# Patient Record
Sex: Female | Born: 1990 | Race: Black or African American | Hispanic: No | Marital: Single | Smoking: Never smoker
Health system: Southern US, Community
[De-identification: ages and names within clinical notes are randomized; demographics above are authoritative.]

## PROBLEM LIST (undated history)

## (undated) ENCOUNTER — Inpatient Hospital Stay (HOSPITAL_COMMUNITY): Payer: Self-pay

## (undated) ENCOUNTER — Ambulatory Visit: Admission: EM | Payer: Medicaid Other

## (undated) DIAGNOSIS — I10 Essential (primary) hypertension: Secondary | ICD-10-CM

## (undated) DIAGNOSIS — M419 Scoliosis, unspecified: Secondary | ICD-10-CM

## (undated) DIAGNOSIS — F419 Anxiety disorder, unspecified: Secondary | ICD-10-CM

## (undated) DIAGNOSIS — J45909 Unspecified asthma, uncomplicated: Secondary | ICD-10-CM

## (undated) HISTORY — DX: Unspecified asthma, uncomplicated: J45.909

## (undated) HISTORY — DX: Essential (primary) hypertension: I10

## (undated) HISTORY — PX: NO PAST SURGERIES: SHX2092

## (undated) HISTORY — DX: Scoliosis, unspecified: M41.9

## (undated) HISTORY — DX: Anxiety disorder, unspecified: F41.9

---

## 2014-10-14 ENCOUNTER — Ambulatory Visit (INDEPENDENT_AMBULATORY_CARE_PROVIDER_SITE_OTHER): Payer: Self-pay | Admitting: Physician Assistant

## 2014-10-14 VITALS — BP 124/70 | HR 90 | Temp 98.7°F | Resp 18 | Ht 67.5 in | Wt 183.0 lb

## 2014-10-14 DIAGNOSIS — Z111 Encounter for screening for respiratory tuberculosis: Secondary | ICD-10-CM

## 2014-10-14 NOTE — Progress Notes (Signed)
Pt presents to clinic for a TB skin test.  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  She has had PPDs in the past but never had a positive test.  She has no symptoms of TB.

## 2014-10-14 NOTE — Progress Notes (Signed)

## 2014-10-16 ENCOUNTER — Ambulatory Visit (INDEPENDENT_AMBULATORY_CARE_PROVIDER_SITE_OTHER): Payer: Self-pay | Admitting: *Deleted

## 2014-10-16 DIAGNOSIS — Z111 Encounter for screening for respiratory tuberculosis: Secondary | ICD-10-CM

## 2014-10-16 LAB — TB SKIN TEST: TB SKIN TEST: NEGATIVE

## 2014-11-27 ENCOUNTER — Inpatient Hospital Stay (HOSPITAL_COMMUNITY)
Admission: AD | Admit: 2014-11-27 | Discharge: 2014-11-27 | Disposition: A | Discharge status: Home / Self Care | Payer: Self-pay | Source: Ambulatory Visit | Attending: Obstetrics and Gynecology | Admitting: Obstetrics and Gynecology

## 2014-11-27 DIAGNOSIS — Z3202 Encounter for pregnancy test, result negative: Secondary | ICD-10-CM | POA: Insufficient documentation

## 2014-11-27 LAB — URINALYSIS, ROUTINE W REFLEX MICROSCOPIC
BILIRUBIN URINE: NEGATIVE
Glucose, UA: NEGATIVE mg/dL
HGB URINE DIPSTICK: NEGATIVE
KETONES UR: NEGATIVE mg/dL
Leukocytes, UA: NEGATIVE
Nitrite: NEGATIVE
Protein, ur: NEGATIVE mg/dL
UROBILINOGEN UA: 0.2 mg/dL (ref 0.0–1.0)
pH: 5.5 (ref 5.0–8.0)

## 2014-11-27 LAB — HCG, SERUM, QUALITATIVE: Preg, Serum: NEGATIVE

## 2014-11-27 LAB — POCT PREGNANCY, URINE: PREG TEST UR: NEGATIVE

## 2014-11-27 NOTE — Discharge Instructions (Signed)
Contraception Choices Contraception (birth control) is the use of any methods or devices to prevent pregnancy. Below are some methods to help avoid pregnancy. HORMONAL METHODS   Contraceptive implant. This is a thin, plastic tube containing progesterone hormone. It does not contain estrogen hormone. Your health care provider inserts the tube in the inner part of the upper arm. The tube can remain in place for up to 3 years. After 3 years, the implant must be removed. The implant prevents the ovaries from releasing an egg (ovulation), thickens the cervical mucus to prevent sperm from entering the uterus, and thins the lining of the inside of the uterus.  Progesterone-only injections. These injections are given every 3 months by your health care provider to prevent pregnancy. This synthetic progesterone hormone stops the ovaries from releasing eggs. It also thickens cervical mucus and changes the uterine lining. This makes it harder for sperm to survive in the uterus.  Birth control pills. These pills contain estrogen and progesterone hormone. They work by preventing the ovaries from releasing eggs (ovulation). They also cause the cervical mucus to thicken, preventing the sperm from entering the uterus. Birth control pills are prescribed by a health care provider.Birth control pills can also be used to treat heavy periods.  Minipill. This type of birth control pill contains only the progesterone hormone. They are taken every day of each month and must be prescribed by your health care provider.  Birth control patch. The patch contains hormones similar to those in birth control pills. It must be changed once a week and is prescribed by a health care provider.  Vaginal ring. The ring contains hormones similar to those in birth control pills. It is left in the vagina for 3 weeks, removed for 1 week, and then a new one is put back in place. The patient must be comfortable inserting and removing the ring  from the vagina.A health care provider's prescription is necessary.  Emergency contraception. Emergency contraceptives prevent pregnancy after unprotected sexual intercourse. This pill can be taken right after sex or up to 5 days after unprotected sex. It is most effective the sooner you take the pills after having sexual intercourse. Most emergency contraceptive pills are available without a prescription. Check with your pharmacist. Do not use emergency contraception as your only form of birth control. BARRIER METHODS   Female condom. This is a thin sheath (latex or rubber) that is worn over the penis during sexual intercourse. It can be used with spermicide to increase effectiveness.  Female condom. This is a soft, loose-fitting sheath that is put into the vagina before sexual intercourse.  Diaphragm. This is a soft, latex, dome-shaped barrier that must be fitted by a health care provider. It is inserted into the vagina, along with a spermicidal jelly. It is inserted before intercourse. The diaphragm should be left in the vagina for 6 to 8 hours after intercourse.  Cervical cap. This is a round, soft, latex or plastic cup that fits over the cervix and must be fitted by a health care provider. The cap can be left in place for up to 48 hours after intercourse.  Sponge. This is a soft, circular piece of polyurethane foam. The sponge has spermicide in it. It is inserted into the vagina after wetting it and before sexual intercourse.  Spermicides. These are chemicals that kill or block sperm from entering the cervix and uterus. They come in the form of creams, jellies, suppositories, foam, or tablets. They do not require a   prescription. They are inserted into the vagina with an applicator before having sexual intercourse. The process must be repeated every time you have sexual intercourse. INTRAUTERINE CONTRACEPTION  Intrauterine device (IUD). This is a T-shaped device that is put in a woman's uterus  during a menstrual period to prevent pregnancy. There are 2 types:  Copper IUD. This type of IUD is wrapped in copper wire and is placed inside the uterus. Copper makes the uterus and fallopian tubes produce a fluid that kills sperm. It can stay in place for 10 years.  Hormone IUD. This type of IUD contains the hormone progestin (synthetic progesterone). The hormone thickens the cervical mucus and prevents sperm from entering the uterus, and it also thins the uterine lining to prevent implantation of a fertilized egg. The hormone can weaken or kill the sperm that get into the uterus. It can stay in place for 3-5 years, depending on which type of IUD is used. PERMANENT METHODS OF CONTRACEPTION  Female tubal ligation. This is when the woman's fallopian tubes are surgically sealed, tied, or blocked to prevent the egg from traveling to the uterus.  Hysteroscopic sterilization. This involves placing a small coil or insert into each fallopian tube. Your doctor uses a technique called hysteroscopy to do the procedure. The device causes scar tissue to form. This results in permanent blockage of the fallopian tubes, so the sperm cannot fertilize the egg. It takes about 3 months after the procedure for the tubes to become blocked. You must use another form of birth control for these 3 months.  Female sterilization. This is when the female has the tubes that carry sperm tied off (vasectomy).This blocks sperm from entering the vagina during sexual intercourse. After the procedure, the man can still ejaculate fluid (semen). NATURAL PLANNING METHODS  Natural family planning. This is not having sexual intercourse or using a barrier method (condom, diaphragm, cervical cap) on days the woman could become pregnant.  Calendar method. This is keeping track of the length of each menstrual cycle and identifying when you are fertile.  Ovulation method. This is avoiding sexual intercourse during ovulation.  Symptothermal  method. This is avoiding sexual intercourse during ovulation, using a thermometer and ovulation symptoms.  Post-ovulation method. This is timing sexual intercourse after you have ovulated. Regardless of which type or method of contraception you choose, it is important that you use condoms to protect against the transmission of sexually transmitted infections (STIs). Talk with your health care provider about which form of contraception is most appropriate for you.   This information is not intended to replace advice given to you by your health care provider. Make sure you discuss any questions you have with your health care provider.   Document Released: 01/31/2005 Document Revised: 02/05/2013 Document Reviewed: 07/26/2012 Elsevier Interactive Patient Education 2016 Elsevier Inc.  

## 2014-11-27 NOTE — MAU Provider Note (Signed)
History     CSN: [id number]  Arrival date and time: 11/27/14 0011   None     Chief Complaint  Patient presents with  . Possible Pregnancy   HPI Comments: Megan Schneider is a 24 y.o. Who presents today for a pregnancy test. She states that she has had multiple negative pregnancy tests at home, and one positive.   Possible Pregnancy This is a new problem. The current episode started in the past 7 days. The problem has been unchanged. Pertinent negatives include no abdominal pain, fever, nausea or vomiting. Associated symptoms comments: Several negative HPT and one positive HPT. Marland Kitchen Nothing aggravates the symptoms. She has tried nothing for the symptoms.    Past Medical History  Diagnosis Date  . Asthma     No past surgical history on file.  Family History  Problem Relation Age of Onset  . Diabetes Father     Social History  Substance Use Topics  . Smoking status: Never Smoker   . Smokeless tobacco: Not on file  . Alcohol Use: No    Allergies: No Known Allergies  Prescriptions prior to admission  Medication Sig Dispense Refill Last Dose  . medroxyPROGESTERone (DEPO-PROVERA) 150 MG/ML injection Inject 150 mg into the muscle every 3 (three) months.   Taking    Review of Systems  Constitutional: Negative for fever.  Gastrointestinal: Negative for nausea, vomiting, abdominal pain, diarrhea and constipation.  Genitourinary: Negative for dysuria, urgency and frequency.   Physical Exam   Blood pressure 146/81, pulse 65, temperature 98.5 F (36.9 C), temperature source Oral, resp. rate 18, SpO2 99 %.  Physical Exam  Nursing note and vitals reviewed. Constitutional: She is oriented to person, place, and time. She appears well-developed and well-nourished. No distress.  HENT:  Head: Normocephalic.  Cardiovascular: Normal rate.   Respiratory: Effort normal.  Neurological: She is alert and oriented to person, place, and time.  Skin: Skin is warm and dry.   Psychiatric: She has a normal mood and affect.     Results for orders placed or performed during the hospital encounter of 11/27/14 (from the past 24 hour(s))  Urinalysis, Routine w reflex microscopic (not at [facility])     Status: Abnormal   Collection Time: 11/27/14 12:17 AM  Result Value Ref Range   Color, Urine YELLOW YELLOW   APPearance CLEAR CLEAR   Specific Gravity, Urine <1.005 (L) 1.005 - 1.030   pH 5.5 5.0 - 8.0   Glucose, UA NEGATIVE NEGATIVE mg/dL   Hgb urine dipstick NEGATIVE NEGATIVE   Bilirubin Urine NEGATIVE NEGATIVE   Ketones, ur NEGATIVE NEGATIVE mg/dL   Protein, ur NEGATIVE NEGATIVE mg/dL   Urobilinogen, UA 0.2 0.0 - 1.0 mg/dL   Nitrite NEGATIVE NEGATIVE   Leukocytes, UA NEGATIVE NEGATIVE  Pregnancy, urine POC     Status: None   Collection Time: 11/27/14 12:42 AM  Result Value Ref Range   Preg Test, Ur NEGATIVE NEGATIVE  hCG, serum, qualitative     Status: None   Collection Time: 11/27/14 12:54 AM  Result Value Ref Range   Preg, Serum NEGATIVE NEGATIVE    MAU Course  Procedures  MDM   Assessment and Plan   1. Encounter for pregnancy test with result negative    DC home Contraception choices reviewed  RX: none    Follow-up Information    Follow up with [facility] [place].   Why:  As needed   Contact information:   [place]  [phone]        Mathis Bud 11/27/2014, 1:34 AM

## 2014-11-27 NOTE — MAU Note (Signed)
Pt presents with request for preg test. Has had multiple negative home preg test but today one was positive.

## 2015-05-13 ENCOUNTER — Encounter (HOSPITAL_COMMUNITY): Payer: Self-pay

## 2015-05-13 ENCOUNTER — Emergency Department (HOSPITAL_COMMUNITY)
Admission: EM | Admit: 2015-05-13 | Discharge: 2015-05-13 | Disposition: A | Discharge status: Home / Self Care | Payer: Self-pay | Attending: Emergency Medicine | Admitting: Emergency Medicine

## 2015-05-13 DIAGNOSIS — Z3202 Encounter for pregnancy test, result negative: Secondary | ICD-10-CM | POA: Insufficient documentation

## 2015-05-13 DIAGNOSIS — R112 Nausea with vomiting, unspecified: Secondary | ICD-10-CM | POA: Insufficient documentation

## 2015-05-13 DIAGNOSIS — R6883 Chills (without fever): Secondary | ICD-10-CM | POA: Insufficient documentation

## 2015-05-13 DIAGNOSIS — M791 Myalgia: Secondary | ICD-10-CM | POA: Insufficient documentation

## 2015-05-13 DIAGNOSIS — R6889 Other general symptoms and signs: Secondary | ICD-10-CM

## 2015-05-13 DIAGNOSIS — J45909 Unspecified asthma, uncomplicated: Secondary | ICD-10-CM | POA: Insufficient documentation

## 2015-05-13 DIAGNOSIS — M549 Dorsalgia, unspecified: Secondary | ICD-10-CM | POA: Insufficient documentation

## 2015-05-13 DIAGNOSIS — R51 Headache: Secondary | ICD-10-CM | POA: Insufficient documentation

## 2015-05-13 LAB — COMPREHENSIVE METABOLIC PANEL
ALBUMIN: 4.1 g/dL (ref 3.5–5.0)
ALK PHOS: 99 U/L (ref 38–126)
ALT: 11 U/L — AB (ref 14–54)
ANION GAP: 5 (ref 5–15)
AST: 17 U/L (ref 15–41)
BILIRUBIN TOTAL: 0.7 mg/dL (ref 0.3–1.2)
BUN: 9 mg/dL (ref 6–20)
CALCIUM: 9.3 mg/dL (ref 8.9–10.3)
CO2: 26 mmol/L (ref 22–32)
CREATININE: 0.96 mg/dL (ref 0.44–1.00)
Chloride: 111 mmol/L (ref 101–111)
GFR calc non Af Amer: >60 mL/min (ref >60)
GLUCOSE: 90 mg/dL (ref 65–99)
Potassium: 4 mmol/L (ref 3.5–5.1)
Sodium: 142 mmol/L (ref 135–145)
TOTAL PROTEIN: 7 g/dL (ref 6.5–8.1)

## 2015-05-13 LAB — CBC WITH DIFFERENTIAL/PLATELET
BASOS PCT: 1 %
Basophils Absolute: 0 10*3/uL (ref 0.0–0.1)
EOS ABS: 0.1 10*3/uL (ref 0.0–0.7)
EOS PCT: 2 %
HCT: 38.6 % (ref 36.0–46.0)
Hemoglobin: 13.5 g/dL (ref 12.0–15.0)
Lymphocytes Relative: 43 %
Lymphs Abs: 1.8 10*3/uL (ref 0.7–4.0)
MCH: 30.3 pg (ref 26.0–34.0)
MCHC: 35 g/dL (ref 30.0–36.0)
MCV: 86.7 fL (ref 78.0–100.0)
MONO ABS: 0.5 10*3/uL (ref 0.1–1.0)
MONOS PCT: 11 %
Neutro Abs: 1.9 10*3/uL (ref 1.7–7.7)
Neutrophils Relative %: 43 %
Platelets: 260 10*3/uL (ref 150–400)
RBC: 4.45 MIL/uL (ref 3.87–5.11)
RDW: 13.2 % (ref 11.5–15.5)
WBC: 4.3 10*3/uL (ref 4.0–10.5)

## 2015-05-13 LAB — URINALYSIS, ROUTINE W REFLEX MICROSCOPIC
Bilirubin Urine: NEGATIVE
GLUCOSE, UA: NEGATIVE mg/dL
HGB URINE DIPSTICK: NEGATIVE
KETONES UR: NEGATIVE mg/dL
LEUKOCYTES UA: NEGATIVE
Nitrite: NEGATIVE
PROTEIN: NEGATIVE mg/dL
Specific Gravity, Urine: 1.016 (ref 1.005–1.030)
pH: 7 (ref 5.0–8.0)

## 2015-05-13 LAB — LIPASE, BLOOD: Lipase: 23 U/L (ref 11–51)

## 2015-05-13 LAB — POC URINE PREG, ED: Preg Test, Ur: NEGATIVE

## 2015-05-13 MED ORDER — ONDANSETRON 8 MG PO TBDP: 8.0000 mg | ORAL_TABLET | Freq: Three times a day (TID) | ORAL | Status: DC | PRN

## 2015-05-13 MED ORDER — ONDANSETRON 8 MG PO TBDP
8.0000 mg | ORAL_TABLET | Freq: Once | ORAL | Status: AC
  Administered 2015-05-13: 8 mg via ORAL
  Filled 2015-05-13: qty 1

## 2015-05-13 NOTE — Discharge Instructions (Signed)
Take ibuprofen or Tylenol for headache and bodyaches. Take Zofran as prescribed as needed for nausea. Rest and drink plenty of fluids. Follow-up as needed.  Nausea and Vomiting Nausea is a sick feeling that often comes before throwing up (vomiting). Vomiting is a reflex where stomach contents come out of your mouth. Vomiting can cause severe loss of body fluids (dehydration). Children and elderly adults can become dehydrated quickly, especially if they also have diarrhea. Nausea and vomiting are symptoms of a condition or disease. It is important to find the cause of your symptoms. CAUSES   Direct irritation of the stomach lining. This irritation can result from increased acid production (gastroesophageal reflux disease), infection, food poisoning, taking certain medicines (such as nonsteroidal anti-inflammatory drugs), alcohol use, or tobacco use.  Signals from the brain.These signals could be caused by a headache, heat exposure, an inner ear disturbance, increased pressure in the brain from injury, infection, a tumor, or a concussion, pain, emotional stimulus, or metabolic problems.  An obstruction in the gastrointestinal tract (bowel obstruction).  Illnesses such as diabetes, hepatitis, gallbladder problems, appendicitis, kidney problems, cancer, sepsis, atypical symptoms of a heart attack, or eating disorders.  Medical treatments such as chemotherapy and radiation.  Receiving medicine that makes you sleep (general anesthetic) during surgery. DIAGNOSIS Your caregiver may ask for tests to be done if the problems do not improve after a few days. Tests may also be done if symptoms are severe or if the reason for the nausea and vomiting is not clear. Tests may include:  Urine tests.  Blood tests.  Stool tests.  Cultures (to look for evidence of infection).  X-rays or other imaging studies. Test results can help your caregiver make decisions about treatment or the need for additional  tests. TREATMENT You need to stay well hydrated. Drink frequently but in small amounts.You may wish to drink water, sports drinks, clear broth, or eat frozen ice pops or gelatin dessert to help stay hydrated.When you eat, eating slowly may help prevent nausea.There are also some antinausea medicines that may help prevent nausea. HOME CARE INSTRUCTIONS   Take all medicine as directed by your caregiver.  If you do not have an appetite, do not force yourself to eat. However, you must continue to drink fluids.  If you have an appetite, eat a normal diet unless your caregiver tells you differently.  Eat a variety of complex carbohydrates (rice, wheat, potatoes, bread), lean meats, yogurt, fruits, and vegetables.  Avoid high-fat foods because they are more difficult to digest.  Drink enough water and fluids to keep your urine clear or pale yellow.  If you are dehydrated, ask your caregiver for specific rehydration instructions. Signs of dehydration may include:  Severe thirst.  Dry lips and mouth.  Dizziness.  Dark urine.  Decreasing urine frequency and amount.  Confusion.  Rapid breathing or pulse. SEEK IMMEDIATE MEDICAL CARE IF:   You have blood or brown flecks (like coffee grounds) in your vomit.  You have black or bloody stools.  You have a severe headache or stiff neck.  You are confused.  You have severe abdominal pain.  You have chest pain or trouble breathing.  You do not urinate at least once every 8 hours.  You develop cold or clammy skin.  You continue to vomit for longer than 24 to 48 hours.  You have a fever. MAKE SURE YOU:   Understand these instructions.  Will watch your condition.  Will get help right away if you are  not doing well or get worse.   This information is not intended to replace advice given to you by your health care provider. Make sure you discuss any questions you have with your health care provider.   Document Released:  01/31/2005 Document Revised: 04/25/2011 Document Reviewed: 06/30/2010 Elsevier Interactive Patient Education [employer].

## 2015-05-13 NOTE — ED Provider Notes (Signed)
CSN: [id number]     Arrival date & time 05/13/15  1032 History   First MD Initiated Contact with Patient 05/13/15 1213     Chief Complaint  Patient presents with  . Headache  . Generalized Body Aches     (Consider location/radiation/quality/duration/timing/severity/associated sxs/prior Treatment) HPI Megan Schneider is a 25 y.o. female history of asthma, presents to emergency department complaining of headache, body aches, back pain, vomiting for 2 days. She states she hasn't been able to eat since yesterday's lunch. She states she has thrown up everything she tried yesterday. She has not tried to eat or drink anything today. She denies any fever or chills recently. No nasal congestion or sore throat. She denies any cough. She denies any abdominal pain or diarrhea. She has not tried any medications to help her with her symptoms. 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 Denies pregnancy  Past Medical History  Diagnosis Date  . Asthma    History reviewed. No pertinent past surgical history. Family History  Problem Relation Age of Onset  . Diabetes Father    Social History  Substance Use Topics  . Smoking status: Never Smoker   . Smokeless tobacco: None  . Alcohol Use: No   OB History    No data available     Review of Systems  Constitutional: Positive for chills. Negative for fever.  Respiratory: Negative for cough, chest tightness and shortness of breath.   Cardiovascular: Negative for chest pain, palpitations and leg swelling.  Gastrointestinal: Positive for nausea and vomiting. Negative for abdominal pain and diarrhea.  Genitourinary: Negative for dysuria, flank pain and pelvic pain.  Musculoskeletal: Positive for myalgias, back pain and arthralgias. Negative for neck pain and neck stiffness.  Skin: Negative for rash.  Neurological: Negative for dizziness, weakness and headaches.  All other systems reviewed and are  negative.     Allergies  Review of patient's allergies indicates no known allergies.  Home Medications   Prior to Admission medications   Medication Sig Start Date End Date Taking? Authorizing Provider  Pseudoeph-Doxylamine-DM-APAP (NYQUIL PO) Take 2 capsules by mouth at bedtime as needed (cold symptoms).   Yes Historical Provider, MD  Pseudoephedrine-APAP-DM (DAYQUIL PO) Take 2 capsules by mouth daily as needed (cold symptoms).   Yes Historical Provider, MD   BP 134/79 mmHg  Pulse 70  Temp(Src) 98.1 F (36.7 C) (Oral)  Resp 18  SpO2 100%  LMP 05/05/2015 Physical Exam  Constitutional: She appears well-developed and well-nourished. No distress.  HENT:  Head: Normocephalic and atraumatic.  Right Ear: External ear normal.  Left Ear: External ear normal.  Nose: Nose normal.  Mouth/Throat: Oropharynx is clear and moist.  Ear canals and TMs are normal bilat  Eyes: Conjunctivae are normal.  Neck: Neck supple.  Cardiovascular: Normal rate, regular rhythm and normal heart sounds.   Pulmonary/Chest: Effort normal and breath sounds normal. No respiratory distress. She has no wheezes. She has no rales.  Abdominal: Soft. Bowel sounds are normal. She exhibits no distension. There is no tenderness. There is no rebound.  Musculoskeletal: She exhibits no edema.  Neurological: She is alert.  Skin: Skin is warm and dry.  Psychiatric: She has a normal mood and affect. Her behavior is normal.  Nursing note and vitals reviewed.   ED Course  Procedures (including critical care time) Labs Review Labs Reviewed  COMPREHENSIVE METABOLIC PANEL - Abnormal; Notable for the following:    ALT 11 (*)  All other components within normal limits  URINALYSIS, ROUTINE W REFLEX MICROSCOPIC (NOT AT [facility])  CBC WITH DIFFERENTIAL/PLATELET  LIPASE, BLOOD  POC URINE PREG, ED    Imaging Review No results found. I have personally reviewed and evaluated these images and lab results as part of my medical  decision-making.   EKG Interpretation None      MDM   Final diagnoses:  Flu-like symptoms   Patient with headache, body aches for 2 days, vomiting yesterday. States she hasn't needed anything since yesterday lunch. Exam unremarkable, abdomen is nontender. Patient is not actively vomiting. Her vital signs are normal. Offered patient IV with some fluids and medications, she refused. She asked for oral medications. Ordered Zofran 8 mg ODT. Will check labs, urinalysis, urine pregnancy.  All labs and urinalysis completely normal. No signs of dehydration or electrolyte abnormalities. Normal white blood cell count. Normal LFTs and lipase. Not pregnant. Most likely viral illness. Home with Zofran, follow up as needed.  Jeannett Senior, PA-C 05/13/15 [place], MD 05/13/15 [phone]

## 2015-05-13 NOTE — ED Notes (Signed)
Pt states body aches, headache and emesis x 2 days.  Unable to eat.  Hasn't eaten since yesterday.  No fever.

## 2016-01-15 ENCOUNTER — Encounter (HOSPITAL_COMMUNITY): Payer: Self-pay

## 2016-01-15 ENCOUNTER — Inpatient Hospital Stay (HOSPITAL_COMMUNITY)
Admission: AD | Admit: 2016-01-15 | Discharge: 2016-01-15 | Disposition: A | Discharge status: Home / Self Care | Payer: Medicaid Other | Source: Ambulatory Visit | Attending: Obstetrics & Gynecology | Admitting: Obstetrics & Gynecology

## 2016-01-15 ENCOUNTER — Encounter: Payer: Self-pay | Admitting: *Deleted

## 2016-01-15 ENCOUNTER — Ambulatory Visit (INDEPENDENT_AMBULATORY_CARE_PROVIDER_SITE_OTHER): Payer: Medicaid Other | Admitting: *Deleted

## 2016-01-15 DIAGNOSIS — N926 Irregular menstruation, unspecified: Secondary | ICD-10-CM

## 2016-01-15 DIAGNOSIS — Z3201 Encounter for pregnancy test, result positive: Secondary | ICD-10-CM | POA: Diagnosis present

## 2016-01-15 DIAGNOSIS — Z3200 Encounter for pregnancy test, result unknown: Secondary | ICD-10-CM

## 2016-01-15 DIAGNOSIS — N912 Amenorrhea, unspecified: Secondary | ICD-10-CM | POA: Insufficient documentation

## 2016-01-15 LAB — POCT PREGNANCY, URINE: Preg Test, Ur: POSITIVE — AB

## 2016-01-15 NOTE — MAU Provider Note (Signed)
  S:   25 y.o. G1P0 @Unknown  by LMP presents to MAU for pregnancy confirmation.  She denies abdominal pain or vaginal bleeding today. Reports 3 + HPTs this week.  O: BP 140/79 (BP Location: Right Arm)   Pulse 84   Temp 98 F (36.7 C) (Oral)   Resp 16   LMP 11/11/2015  Physical Examination: General appearance - alert, well appearing, and in no distress, oriented to person, place, and time and acyanotic, in no respiratory distress  No results found for this or any previous visit (from the past 48 hour(s)).  A: Missed menses  P: D/C home Informed pt we do not do pregnancy verifications in MAU Referred to [facility] for pregnancy test & verification Return to MAU as needed for pregnancy related emergencies  Jorje Guild, NP 10:20 AM

## 2016-01-15 NOTE — MAU Note (Signed)
Took 3 pregnancy tests were positive just wants to be sure.  Denies any problems today, no pain, no vagina bleeding.  LMP 9/27

## 2016-01-15 NOTE — Progress Notes (Signed)
Is here for pregnancy test today which was positive. LMP 12/14/15 which makes her 4wk4d today with EDD 09/19/16. Is sure of LMP.  Not sure where she would like to get care. Letter of proof of pregnancy given.

## 2016-02-15 NOTE — L&D Delivery Note (Signed)
Delivery Note At 10:47 PM a viable and healthy female was delivered via Vaginal, Spontaneous Delivery (Presentation:OA ;ROA  ).  APGAR: , ; weight pending  .   Placenta status:intact , .  Cord: 3 vessel with the following complications: none  Anesthesia:  epidural Episiotomy: None Lacerations: 2nd degree;Perineal Suture Repair: 3.0 vicryl Est. Blood Loss (mL):  250  Mom to postpartum.  Baby to Couplet care / Skin to Skin.  Sela Hua 09/20/2016, 11:21 PM  OB FELLOW DELIVERY ATTESTATION  I was gloved and present for the delivery in its entirety, and I agree with the above resident's note.    Gailen Shelter, MD OB Fellow 12:10 AM

## 2016-03-07 ENCOUNTER — Encounter (HOSPITAL_COMMUNITY): Payer: Self-pay | Admitting: Emergency Medicine

## 2016-03-07 ENCOUNTER — Emergency Department (HOSPITAL_COMMUNITY)
Admission: EM | Admit: 2016-03-07 | Discharge: 2016-03-07 | Disposition: A | Discharge status: Home / Self Care | Payer: Medicaid Other | Attending: Emergency Medicine | Admitting: Emergency Medicine

## 2016-03-07 DIAGNOSIS — M545 Low back pain, unspecified: Secondary | ICD-10-CM

## 2016-03-07 DIAGNOSIS — J45909 Unspecified asthma, uncomplicated: Secondary | ICD-10-CM | POA: Insufficient documentation

## 2016-03-07 DIAGNOSIS — Z79899 Other long term (current) drug therapy: Secondary | ICD-10-CM | POA: Diagnosis not present

## 2016-03-07 DIAGNOSIS — Z3A12 12 weeks gestation of pregnancy: Secondary | ICD-10-CM | POA: Insufficient documentation

## 2016-03-07 DIAGNOSIS — Y929 Unspecified place or not applicable: Secondary | ICD-10-CM | POA: Insufficient documentation

## 2016-03-07 DIAGNOSIS — Y93F2 Activity, caregiving, lifting: Secondary | ICD-10-CM | POA: Insufficient documentation

## 2016-03-07 DIAGNOSIS — O9989 Other specified diseases and conditions complicating pregnancy, childbirth and the puerperium: Secondary | ICD-10-CM | POA: Insufficient documentation

## 2016-03-07 DIAGNOSIS — X500XXA Overexertion from strenuous movement or load, initial encounter: Secondary | ICD-10-CM | POA: Insufficient documentation

## 2016-03-07 DIAGNOSIS — Y999 Unspecified external cause status: Secondary | ICD-10-CM | POA: Diagnosis not present

## 2016-03-07 LAB — URINALYSIS, ROUTINE W REFLEX MICROSCOPIC
Bilirubin Urine: NEGATIVE
Glucose, UA: NEGATIVE mg/dL
HGB URINE DIPSTICK: NEGATIVE
Ketones, ur: NEGATIVE mg/dL
Nitrite: NEGATIVE
PROTEIN: NEGATIVE mg/dL
Specific Gravity, Urine: 1.01 (ref 1.005–1.030)
pH: 8 (ref 5.0–8.0)

## 2016-03-07 NOTE — Discharge Instructions (Signed)
This is likely a muscle strain. He may take Tylenol for pain. Use a heating pad and soak in a warm bath. Avoid NSAIDs. Your urine does show signs of yeast. Given acute have any symptoms please follow-up with your OB/GYN on Wednesday. Your urine were cultured and will be informed if needed for antibiotics. Please follow-up at your OB/GYN appointment on Wednesday. Return to the ED to develop any urinary symptoms, fevers, worsening abdominal pain, vaginal discharge, vaginal bleeding.

## 2016-03-07 NOTE — ED Provider Notes (Signed)
[place] DEPT Provider Note   CSN: [id number] Arrival date & time: 03/07/16  1024     History   Chief Complaint Chief Complaint  Patient presents with  . Back Pain    HPI Megan Schneider is a 26 y.o. female.  26 yo female with no sig pmh presents to the ED with complaints of left side low back pain. 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 Pt state movement makes the pain worse. She has tried motrin, tylenol, hot and cold compresses at home with little relief. Pt states the pain has progressively worsened. Pt is approx [redacted] weeks pregnant. She is G1. Pt denies any contractions, leaking of fluid, vaginal bleeding, or vaginal discharge. Pt denies any urinary retention, loss of bowel or bladder, saddle paresthesias, numbness/tingling in lower extremities, lower extremities edema. Pt denies any fever, chills, ha, vision changes, headache, vision changes, lightheadedness, dizziness, cp, sob, abd pain n/v/d, urinary symptoms, vaginal bleeding or discharge, change in bowel habits, parathesias. She has her first appointment with her OB/GYN at [facility] on Wednesday.       Past Medical History:  Diagnosis Date  . Asthma     There are no active problems to display for this patient.   History reviewed. No pertinent surgical history.  OB History    Gravida Para Term Preterm AB Living   1             SAB TAB Ectopic Multiple Live Births                   Home Medications    Prior to Admission medications   Not on File    Family History Family History  Problem Relation Age of Onset  . Diabetes Father     Social History Social History  Substance Use Topics  . Smoking status: Never Smoker  . Smokeless tobacco: Not on file  . Alcohol use No     Allergies   Patient has no known allergies.   Review of Systems Review of Systems  Constitutional: Negative for chills and fever.  HENT:  Negative for congestion.   Eyes: Negative for visual disturbance.  Respiratory: Negative for cough and shortness of breath.   Cardiovascular: Negative for chest pain and leg swelling.  Gastrointestinal: Negative for abdominal pain, diarrhea, nausea and vomiting.  Genitourinary: Negative for dysuria, frequency, hematuria, urgency, vaginal bleeding and vaginal discharge.  Musculoskeletal: Positive for back pain (left lower back). Negative for neck pain and neck stiffness.  Skin: Negative for color change and wound.  Neurological: Negative for dizziness, syncope, weakness, light-headedness, numbness and headaches.  All other systems reviewed and are negative.    Physical Exam Updated Vital Signs BP 131/70 (BP Location: Left Arm)   Pulse 66   Temp 99 F (37.2 C) (Oral)   Resp 18   Ht 5\' 7"  (1.702 m)   Wt 83.9 kg   LMP 11/11/2015   SpO2 100%   BMI 28.98 kg/m   Physical Exam  Constitutional: She appears well-developed and well-nourished. No distress.  HENT:  Head: Normocephalic and atraumatic.  Mouth/Throat: Oropharynx is clear and moist.  Eyes: Conjunctivae and EOM are normal. Pupils are equal, round, and reactive to light. Right eye exhibits no discharge. Left eye exhibits no discharge. No scleral icterus.  Neck: Normal range of motion. Neck supple. No thyromegaly present.  No midline c spine tenderness. No  paraspinal tenderness. No deformity or step off noted. Full ROM.  Cardiovascular: Normal rate, regular rhythm, normal heart sounds and intact distal pulses.   DP pulses are 2+ bilaterally  Pulmonary/Chest: Effort normal and breath sounds normal.  Abdominal: Soft. Bowel sounds are normal. She exhibits no distension. There is no tenderness. There is no rebound and no guarding.  No CVA tenderness.  Musculoskeletal: Normal range of motion.  No midline L-spine or T-spine tenderness. No deformities or step-offs noted. Patient with left-sided lumbar paraspinal tenderness that  radiates to left buttocks. Tense musculature noted. Pain does not radiate. Negative straight leg raise test the left and right. Strength 5 out of 5 in lower extremities.  Lymphadenopathy:    She has no cervical adenopathy.  Neurological: She is alert.  Strength 5 out of 5 in lower extremities. Sensation intact. Cap refill normal.  Skin: Skin is warm and dry. Capillary refill takes less than 2 seconds.  Nursing note and vitals reviewed.    ED Treatments / Results  Labs (all labs ordered are listed, but only abnormal results are displayed) Labs Reviewed  URINALYSIS, ROUTINE W REFLEX MICROSCOPIC - Abnormal; Notable for the following:       Result Value   APPearance HAZY (*)    Leukocytes, UA TRACE (*)    Bacteria, UA FEW (*)    Squamous Epithelial / LPF 6-30 (*)    All other components within normal limits  URINE CULTURE    EKG  EKG Interpretation None       Radiology No results found.  Procedures Procedures (including critical care time)  Medications Ordered in ED Medications - No data to display   Initial Impression / Assessment and Plan / ED Course  I have reviewed the triage vital signs and the nursing notes.  Pertinent labs & imaging results that were available during my care of the patient were reviewed by me and considered in my medical decision making (see chart for details).    Patient presents to the ED with complaint of left lower back pain after lifting a patient into the shower 4 days ago. Patient is [redacted] weeks pregnant. Patient denies any red flag symptoms including urinary retention, loss of bowel or bladder, saddle paresthesias. Patient is neurovascularly intact. Patient denies any urinary symptoms. Patient is not tachycardic and afebrile. We'll obtain urine to check for infection. Will assess fetal heart tones. This is likely musculoskeletal injury.Fetal heart tones are 143. Ultrasound was used to determine intrauterine pregnancy. Intrauterine pregnancy was  determined. No signs of ectopic pregnancy on ultrasound. Urine with few bacteria and many squamous epithelials. This is likely asymptomatic bacteriuria. Patient denies urinary symptoms. We'll culture urine not treat at this time. Patient is afebrile and not tachycardic. She does have some yeast in her urine. Patient is not symptomatic. Encouraged to follow-up with her OB/GYN appointment on Wednesday to determine if they don't treatment necessary. Exam is consistent with musculoskeletal pain. Encourage patient to utilize Tylenol. Avoid NSAIDs. Encourage symptomatic treatment. Patient is nontoxic appearing. She is able to a light in the ED. Pt is hemodynamically stable, in NAD, & able to ambulate in the ED. Pain has been managed & has no complaints prior to dc. Pt is comfortable with above plan and is stable for discharge at this time. All questions were answered prior to disposition. Strict return precautions for f/u to the ED were discussed. Pt discussed with Dr. Dayna Barker who is agreeable to the above plan.   Final Clinical Impressions(s) /  ED Diagnoses   Final diagnoses:  Acute left-sided low back pain without sciatica    New Prescriptions New Prescriptions   No medications on file     Doristine Devoid, PA-C 03/07/16 [place], MD 03/08/16 1254

## 2016-03-07 NOTE — ED Triage Notes (Addendum)
Pt sts lower back pain x 4 days; pt sts currently [redacted] weeks pregnant; G1

## 2016-03-07 NOTE — ED Notes (Signed)
FHT 148 at mid suprapubic  region

## 2016-03-08 DIAGNOSIS — Z3490 Encounter for supervision of normal pregnancy, unspecified, unspecified trimester: Secondary | ICD-10-CM | POA: Insufficient documentation

## 2016-03-09 ENCOUNTER — Ambulatory Visit (INDEPENDENT_AMBULATORY_CARE_PROVIDER_SITE_OTHER): Payer: Medicaid Other | Admitting: Obstetrics & Gynecology

## 2016-03-09 ENCOUNTER — Encounter: Payer: Self-pay | Admitting: Obstetrics & Gynecology

## 2016-03-09 ENCOUNTER — Other Ambulatory Visit (HOSPITAL_COMMUNITY)
Admission: RE | Admit: 2016-03-09 | Discharge: 2016-03-09 | Disposition: A | Discharge status: Home / Self Care | Payer: Medicaid Other | Source: Ambulatory Visit | Attending: Obstetrics & Gynecology | Admitting: Obstetrics & Gynecology

## 2016-03-09 DIAGNOSIS — Z113 Encounter for screening for infections with a predominantly sexual mode of transmission: Secondary | ICD-10-CM | POA: Insufficient documentation

## 2016-03-09 DIAGNOSIS — Z3491 Encounter for supervision of normal pregnancy, unspecified, first trimester: Secondary | ICD-10-CM

## 2016-03-09 DIAGNOSIS — Z3490 Encounter for supervision of normal pregnancy, unspecified, unspecified trimester: Secondary | ICD-10-CM

## 2016-03-09 LAB — URINE CULTURE

## 2016-03-09 MED ORDER — PROMETHAZINE HCL 25 MG PO TABS
25.0000 mg | ORAL_TABLET | Freq: Four times a day (QID) | ORAL | 2 refills | Status: DC | PRN
Start: 2016-03-09 — End: 2016-06-29

## 2016-03-09 MED ORDER — PRENATAL VITAMINS 0.8 MG PO TABS: 1.0000 | ORAL_TABLET | Freq: Every day | ORAL | 12 refills | Status: DC

## 2016-03-09 NOTE — Progress Notes (Signed)
Patient is in the office, states that she feels good today.

## 2016-03-09 NOTE — Progress Notes (Signed)
  Subjective:    Megan Schneider is a G1P0 [redacted]w[redacted]d being seen today for her first obstetrical visit.  Her obstetrical history is significant for first pregnancy unsure dates. Patient does intend to breast feed. Pregnancy history fully reviewed.  Patient reports nausea.  Vitals:   03/09/16 1108  BP: 119/76  Pulse: 79  Temp: 98.8 F (37.1 C)  Weight: 186 lb 9.6 oz (84.6 kg)    HISTORY: OB History  Gravida Para Term Preterm AB Living  1            SAB TAB Ectopic Multiple Live Births               # Outcome Date GA Lbr Len/2nd Weight Sex Delivery Anes PTL Lv  1 Current              Past Medical History:  Diagnosis Date  . Asthma   . Scoliosis    History reviewed. No pertinent surgical history. Family History  Problem Relation Age of Onset  . Diabetes Father      Exam    Uterus:  Fundal Height: 12 cm  Pelvic Exam:    Perineum: No Hemorrhoids   Vulva: normal   Vagina:  normal mucosa   pH:     Cervix: no lesions   Adnexa: normal adnexa   Bony Pelvis: average  System: Breast:  normal appearance, no masses or tenderness   Skin: normal coloration and turgor, no rashes    Neurologic: oriented, normal mood   Extremities: normal strength, tone, and muscle mass   HEENT extra ocular movement intact, oropharynx clear, no lesions and neck supple with midline trachea   Mouth/Teeth dental hygiene good   Neck supple   Cardiovascular: regular rate and rhythm   Respiratory:  appears well, vitals normal, no respiratory distress, acyanotic, normal RR, neck free of mass or lymphadenopathy   Abdomen: soft, non-tender; bowel sounds normal; no masses,  no organomegaly   Urinary: urethral meatus normal      Assessment:    Pregnancy: G1P0 Patient Active Problem List   Diagnosis Date Noted  . Supervision of normal pregnancy, antepartum 03/08/2016        Plan:     Initial labs drawn. Prenatal vitamins. Problem list reviewed and updated. Genetic Screening discussed  First Screen: ordered.  Ultrasound discussed; fetal survey: 18+ weeks.  Follow up in 4 weeks. 50% of 30 min visit spent on counseling and coordination of care.     Emeterio Reeve 03/09/2016

## 2016-03-10 LAB — CYTOLOGY - PAP: DIAGNOSIS: NEGATIVE

## 2016-03-10 LAB — GC/CHLAMYDIA PROBE AMP (~~LOC~~) NOT AT ARMC
CHLAMYDIA, DNA PROBE: NEGATIVE
NEISSERIA GONORRHEA: NEGATIVE

## 2016-03-11 ENCOUNTER — Telehealth: Payer: Self-pay | Admitting: *Deleted

## 2016-03-11 LAB — HEMOGLOBINOPATHY EVALUATION
HGB A: 97.6 % (ref 96.4–98.8)
HGB C: 0 %
HGB S: 0 %
Hemoglobin A2 Quantitation: 2.4 % (ref 1.8–3.2)
Hemoglobin F Quantitation: 0 % (ref 0.0–2.0)

## 2016-03-11 LAB — OBSTETRIC PANEL, INCLUDING HIV
Antibody Screen: NEGATIVE
BASOS ABS: 0 10*3/uL (ref 0.0–0.2)
Basos: 0 %
EOS (ABSOLUTE): 0.1 10*3/uL (ref 0.0–0.4)
Eos: 1 %
HEP B S AG: NEGATIVE
HIV Screen 4th Generation wRfx: NONREACTIVE
Hematocrit: 39.1 % (ref 34.0–46.6)
Hemoglobin: 13.2 g/dL (ref 11.1–15.9)
IMMATURE GRANS (ABS): 0.1 10*3/uL (ref 0.0–0.1)
IMMATURE GRANULOCYTES: 1 %
LYMPHS ABS: 1.6 10*3/uL (ref 0.7–3.1)
LYMPHS: 17 %
MCH: 30.4 pg (ref 26.6–33.0)
MCHC: 33.8 g/dL (ref 31.5–35.7)
MCV: 90 fL (ref 79–97)
MONOCYTES: 10 %
Monocytes Absolute: 0.9 10*3/uL (ref 0.1–0.9)
NEUTROS PCT: 71 %
Neutrophils Absolute: 6.5 10*3/uL (ref 1.4–7.0)
PLATELETS: 254 10*3/uL (ref 150–379)
RBC: 4.34 x10E6/uL (ref 3.77–5.28)
RDW: 14.4 % (ref 12.3–15.4)
RPR: NONREACTIVE
RUBELLA: 4.17 {index} (ref >0.99)
Rh Factor: POSITIVE
WBC: 9.2 10*3/uL (ref 3.4–10.8)

## 2016-03-11 LAB — VARICELLA ZOSTER ANTIBODY, IGG: Varicella zoster IgG: 2108 index (ref >165)

## 2016-03-11 NOTE — Telephone Encounter (Signed)
Pt returned call to office.  Pt states that pharmacy states her Rx were not at pharmacy.  Pt made aware PNV and Phenergan were both escribed and received by pharmacy.  Pt made aware of pharmacy location.  Pt advised to call back Monday if she cannot get Rx. Pt states she has yeast infection, what to use.  Pt made aware Diflucan not to be sent due to end of first trimester.  Pt was made aware that she may use Monistat OTC.  Pt advised to call office if symptoms do not resolve after treatment.

## 2016-03-11 NOTE — Telephone Encounter (Signed)
Pt called to office left no message.  Return call to pt. No answer, LM On VM to call back if needed.

## 2016-03-13 LAB — CULTURE, OB URINE

## 2016-03-13 LAB — URINE CULTURE, OB REFLEX

## 2016-03-16 ENCOUNTER — Ambulatory Visit (HOSPITAL_COMMUNITY)
Admission: RE | Admit: 2016-03-16 | Discharge: 2016-03-16 | Disposition: A | Discharge status: Home / Self Care | Payer: Medicaid Other | Source: Ambulatory Visit | Attending: Obstetrics & Gynecology | Admitting: Obstetrics & Gynecology

## 2016-03-16 ENCOUNTER — Encounter (HOSPITAL_COMMUNITY): Payer: Self-pay

## 2016-03-16 DIAGNOSIS — Z3682 Encounter for antenatal screening for nuchal translucency: Secondary | ICD-10-CM | POA: Insufficient documentation

## 2016-03-16 DIAGNOSIS — Z3490 Encounter for supervision of normal pregnancy, unspecified, unspecified trimester: Secondary | ICD-10-CM

## 2016-03-16 DIAGNOSIS — Z3A12 12 weeks gestation of pregnancy: Secondary | ICD-10-CM | POA: Insufficient documentation

## 2016-03-17 LAB — TOXASSURE SELECT 13 (MW), URINE

## 2016-03-21 ENCOUNTER — Other Ambulatory Visit: Payer: Self-pay

## 2016-04-06 ENCOUNTER — Ambulatory Visit (INDEPENDENT_AMBULATORY_CARE_PROVIDER_SITE_OTHER): Payer: Medicaid Other | Admitting: Obstetrics & Gynecology

## 2016-04-06 DIAGNOSIS — Z3490 Encounter for supervision of normal pregnancy, unspecified, unspecified trimester: Secondary | ICD-10-CM

## 2016-04-06 DIAGNOSIS — Z3492 Encounter for supervision of normal pregnancy, unspecified, second trimester: Secondary | ICD-10-CM

## 2016-04-06 NOTE — Progress Notes (Signed)
   PRENATAL VISIT NOTE  Subjective:  Megan Schneider is a 26 y.o. G1P0 at [redacted]w[redacted]d being seen today for ongoing prenatal care.  She is currently monitored for the following issues for this low-risk pregnancy and has Supervision of normal pregnancy, antepartum on her problem list.  Patient reports no complaints.  Contractions: Not present. Vag. Bleeding: None.   . Denies leaking of fluid.   The following portions of the patient's history were reviewed and updated as appropriate: allergies, current medications, past family history, past medical history, past social history, past surgical history and problem list. Problem list updated.  Objective:   Vitals:   04/06/16 0902  BP: 119/72  Pulse: 73  Weight: 190 lb (86.2 kg)    Fetal Status: Fetal Heart Rate (bpm): 139         General:  Alert, oriented and cooperative. Patient is in no acute distress.  Skin: Skin is warm and dry. No rash noted.   Cardiovascular: Normal heart rate noted  Respiratory: Normal respiratory effort, no problems with respiration noted  Abdomen: Soft, gravid, appropriate for gestational age. Pain/Pressure: Absent     Pelvic:  Cervical exam deferred        Extremities: Normal range of motion.  Edema: None  Mental Status: Normal mood and affect. Normal behavior. Normal judgment and thought content.   Assessment and Plan:  Pregnancy: G1P0 at [redacted]w[redacted]d  1. Encounter for supervision of normal pregnancy, antepartum, unspecified gravidity Normal first screen, AFP today  Preterm labor symptoms and general obstetric precautions including but not limited to vaginal bleeding, contractions, leaking of fluid and fetal movement were reviewed in detail with the patient. Please refer to After Visit Summary for other counseling recommendations.  Return in about 4 weeks (around 05/04/2016).   Woodroe Mode, MD

## 2016-04-06 NOTE — Patient Instructions (Signed)
Second Trimester of Pregnancy The second trimester is from week 13 through week 28 (months 4 through 6). The second trimester is often a time when you feel your best. Your body has also adjusted to being pregnant, and you begin to feel better physically. Usually, morning sickness has lessened or quit completely, you may have more energy, and you may have an increase in appetite. The second trimester is also a time when the fetus is growing rapidly. At the end of the sixth month, the fetus is about 9 inches long and weighs about 1 pounds. You will likely begin to feel the baby move (quickening) between 18 and 20 weeks of the pregnancy. Body changes during your second trimester Your body continues to go through many changes during your second trimester. The changes vary from woman to woman.  Your weight will continue to increase. You will notice your lower abdomen bulging out.  You may begin to get stretch marks on your hips, abdomen, and breasts.  You may develop headaches that can be relieved by medicines. The medicines should be approved by your health care provider.  You may urinate more often because the fetus is pressing on your bladder.  You may develop or continue to have heartburn as a result of your pregnancy.  You may develop constipation because certain hormones are causing the muscles that push waste through your intestines to slow down.  You may develop hemorrhoids or swollen, bulging veins (varicose veins).  You may have back pain. This is caused by:  Weight gain.  Pregnancy hormones that are relaxing the joints in your pelvis.  A shift in weight and the muscles that support your balance.  Your breasts will continue to grow and they will continue to become tender.  Your gums may bleed and may be sensitive to brushing and flossing.  Dark spots or blotches (chloasma, mask of pregnancy) may develop on your face. This will likely fade after the baby is born.  A dark line  from your belly button to the pubic area (linea nigra) may appear. This will likely fade after the baby is born.  You may have changes in your hair. These can include thickening of your hair, rapid growth, and changes in texture. Some women also have hair loss during or after pregnancy, or hair that feels dry or thin. Your hair will most likely return to normal after your baby is born. What to expect at prenatal visits During a routine prenatal visit:  You will be weighed to make sure you and the fetus are growing normally.  Your blood pressure will be taken.  Your abdomen will be measured to track your baby's growth.  The fetal heartbeat will be listened to.  Any test results from the previous visit will be discussed. Your health care provider may ask you:  How you are feeling.  If you are feeling the baby move.  If you have had any abnormal symptoms, such as leaking fluid, bleeding, severe headaches, or abdominal cramping.  If you are using any tobacco products, including cigarettes, chewing tobacco, and electronic cigarettes.  If you have any questions. Other tests that may be performed during your second trimester include:  Blood tests that check for:  Low iron levels (anemia).  Gestational diabetes (between 24 and 28 weeks).  Rh antibodies. This is to check for a protein on red blood cells (Rh factor).  Urine tests to check for infections, diabetes, or protein in the urine.  An ultrasound to  confirm the proper growth and development of the baby.  An amniocentesis to check for possible genetic problems.  Fetal screens for spina bifida and Down syndrome.  HIV (human immunodeficiency virus) testing. Routine prenatal testing includes screening for HIV, unless you choose not to have this test. Follow these instructions at home: Eating and drinking  Continue to eat regular, healthy meals.  Avoid raw meat, uncooked cheese, cat litter boxes, and soil used by cats. These  carry germs that can cause birth defects in the baby.  Take your prenatal vitamins.  Take 1500-2000 mg of calcium daily starting at the 20th week of pregnancy until you deliver your baby.  If you develop constipation:  Take over-the-counter or prescription medicines.  Drink enough fluid to keep your urine clear or pale yellow.  Eat foods that are high in fiber, such as fresh fruits and vegetables, whole grains, and beans.  Limit foods that are high in fat and processed sugars, such as fried and sweet foods. Activity  Exercise only as directed by your health care provider. Experiencing uterine cramps is a good sign to stop exercising.  Avoid heavy lifting, wear low heel shoes, and practice good posture.  Wear your seat belt at all times when driving.  Rest with your legs elevated if you have leg cramps or low back pain.  Wear a good support bra for breast tenderness.  Do not use hot tubs, steam rooms, or saunas. Lifestyle  Avoid all smoking, herbs, alcohol, and unprescribed drugs. These chemicals affect the formation and growth of the baby.  Do not use any products that contain nicotine or tobacco, such as cigarettes and e-cigarettes. If you need help quitting, ask your health care provider.  A sexual relationship may be continued unless your health care provider directs you otherwise. General instructions  Follow your health care provider's instructions regarding medicine use. There are medicines that are either safe or unsafe to take during pregnancy.  Take warm sitz baths to soothe any pain or discomfort caused by hemorrhoids. Use hemorrhoid cream if your health care provider approves.  If you develop varicose veins, wear support hose. Elevate your feet for 15 minutes, 3-4 times a day. Limit salt in your diet.  Visit your dentist if you have not gone yet during your pregnancy. Use a soft toothbrush to brush your teeth and be gentle when you floss.  Keep all follow-up  prenatal visits as told by your health care provider. This is important. Contact a health care provider if:  You have dizziness.  You have mild pelvic cramps, pelvic pressure, or nagging pain in the abdominal area.  You have persistent nausea, vomiting, or diarrhea.  You have a bad smelling vaginal discharge.  You have pain with urination. Get help right away if:  You have a fever.  You are leaking fluid from your vagina.  You have spotting or bleeding from your vagina.  You have severe abdominal cramping or pain.  You have rapid weight gain or weight loss.  You have shortness of breath with chest pain.  You notice sudden or extreme swelling of your face, hands, ankles, feet, or legs.  You have not felt your baby move in over an hour.  You have severe headaches that do not go away with medicine.  You have vision changes. Summary  The second trimester is from week 13 through week 28 (months 4 through 6). It is also a time when the fetus is growing rapidly.  Your body goes  through many changes during pregnancy. The changes vary from woman to woman.  Avoid all smoking, herbs, alcohol, and unprescribed drugs. These chemicals affect the formation and growth your baby.  Do not use any tobacco products, such as cigarettes, chewing tobacco, and e-cigarettes. If you need help quitting, ask your health care provider.  Contact your health care provider if you have any questions. Keep all prenatal visits as told by your health care provider. This is important. This information is not intended to replace advice given to you by your health care provider. Make sure you discuss any questions you have with your health care provider. Document Released: 01/25/2001 Document Revised: 07/09/2015 Document Reviewed: 04/03/2012 Elsevier Interactive Patient Education  2017 [employer].

## 2016-04-13 LAB — AFP, SERUM, OPEN SPINA BIFIDA
AFP MOM: 1.03
AFP VALUE AFPOSL: 31.8 ng/mL
Gest. Age on Collection Date: 16 weeks
MATERNAL AGE AT EDD: 24.9 a
OSBR Risk 1 IN: 10000
Test Results:: NEGATIVE
Weight: 190 [lb_av]

## 2016-04-26 ENCOUNTER — Ambulatory Visit (HOSPITAL_COMMUNITY)
Admission: RE | Admit: 2016-04-26 | Discharge: 2016-04-26 | Disposition: A | Discharge status: Home / Self Care | Payer: Medicaid Other | Source: Ambulatory Visit | Attending: Obstetrics & Gynecology | Admitting: Obstetrics & Gynecology

## 2016-04-26 DIAGNOSIS — Z3A19 19 weeks gestation of pregnancy: Secondary | ICD-10-CM | POA: Diagnosis not present

## 2016-04-26 DIAGNOSIS — Z363 Encounter for antenatal screening for malformations: Secondary | ICD-10-CM | POA: Diagnosis not present

## 2016-04-26 DIAGNOSIS — O99512 Diseases of the respiratory system complicating pregnancy, second trimester: Secondary | ICD-10-CM | POA: Diagnosis not present

## 2016-04-26 DIAGNOSIS — J45909 Unspecified asthma, uncomplicated: Secondary | ICD-10-CM | POA: Diagnosis not present

## 2016-04-26 DIAGNOSIS — Z3490 Encounter for supervision of normal pregnancy, unspecified, unspecified trimester: Secondary | ICD-10-CM

## 2016-05-04 ENCOUNTER — Ambulatory Visit (INDEPENDENT_AMBULATORY_CARE_PROVIDER_SITE_OTHER): Payer: Medicaid Other | Admitting: Obstetrics and Gynecology

## 2016-05-04 DIAGNOSIS — Z3490 Encounter for supervision of normal pregnancy, unspecified, unspecified trimester: Secondary | ICD-10-CM

## 2016-05-04 DIAGNOSIS — Z3402 Encounter for supervision of normal first pregnancy, second trimester: Secondary | ICD-10-CM

## 2016-05-04 NOTE — Progress Notes (Signed)
Subjective:  Megan Schneider is a 26 y.o. G1P0 at [redacted]w[redacted]d being seen today for ongoing prenatal care.  She is currently monitored for the following issues for this low-risk pregnancy and has Supervision of normal pregnancy, antepartum on her problem list.  Patient reports some round ligament pain.  Contractions: Irritability. Vag. Bleeding: None.   . Denies leaking of fluid.   The following portions of the patient's history were reviewed and updated as appropriate: allergies, current medications, past family history, past medical history, past social history, past surgical history and problem list. Problem list updated.  Objective:   Vitals:   05/04/16 0902  BP: 124/67  Pulse: 72  Weight: 193 lb (87.5 kg)    Fetal Status: Fetal Heart Rate (bpm): 140         General:  Alert, oriented and cooperative. Patient is in no acute distress.  Skin: Skin is warm and dry. No rash noted.   Cardiovascular: Normal heart rate noted  Respiratory: Normal respiratory effort, no problems with respiration noted  Abdomen: Soft, gravid, appropriate for gestational age. Pain/Pressure: Absent     Pelvic:  Cervical exam deferred        Extremities: Normal range of motion.     Mental Status: Normal mood and affect. Normal behavior. Normal judgment and thought content.   Urinalysis:      Assessment and Plan:  Pregnancy: G1P0 at [redacted]w[redacted]d  1. Encounter for supervision of normal pregnancy, antepartum, unspecified gravidity Stable F/U U/S scheduled Considering water birth, information provided to pt  Preterm labor symptoms and general obstetric precautions including but not limited to vaginal bleeding, contractions, leaking of fluid and fetal movement were reviewed in detail with the patient. Please refer to After Visit Summary for other counseling recommendations.  Return in about 4 weeks (around 06/01/2016) for OB visit.   Chancy Milroy, MD

## 2016-05-04 NOTE — Progress Notes (Signed)
Pt states she has had increase in cramping over the past 2 days, pt denies bleeding.

## 2016-05-04 NOTE — Patient Instructions (Signed)

## 2016-05-09 ENCOUNTER — Encounter (HOSPITAL_COMMUNITY): Payer: Self-pay

## 2016-05-09 ENCOUNTER — Inpatient Hospital Stay (HOSPITAL_COMMUNITY)
Admission: AD | Admit: 2016-05-09 | Discharge: 2016-05-09 | Disposition: A | Discharge status: Home / Self Care | Payer: Medicaid Other | Source: Ambulatory Visit | Attending: Obstetrics and Gynecology | Admitting: Obstetrics and Gynecology

## 2016-05-09 DIAGNOSIS — O2342 Unspecified infection of urinary tract in pregnancy, second trimester: Secondary | ICD-10-CM | POA: Diagnosis not present

## 2016-05-09 DIAGNOSIS — Z3A21 21 weeks gestation of pregnancy: Secondary | ICD-10-CM | POA: Diagnosis not present

## 2016-05-09 DIAGNOSIS — R109 Unspecified abdominal pain: Secondary | ICD-10-CM | POA: Diagnosis present

## 2016-05-09 DIAGNOSIS — O26892 Other specified pregnancy related conditions, second trimester: Secondary | ICD-10-CM | POA: Diagnosis present

## 2016-05-09 DIAGNOSIS — N949 Unspecified condition associated with female genital organs and menstrual cycle: Secondary | ICD-10-CM

## 2016-05-09 LAB — URINALYSIS, ROUTINE W REFLEX MICROSCOPIC
BILIRUBIN URINE: NEGATIVE
Glucose, UA: NEGATIVE mg/dL
Ketones, ur: NEGATIVE mg/dL
Nitrite: NEGATIVE
PROTEIN: NEGATIVE mg/dL
Specific Gravity, Urine: 1.006 (ref 1.005–1.030)
pH: 6 (ref 5.0–8.0)

## 2016-05-09 LAB — CBC
HCT: 33.7 % — ABNORMAL LOW (ref 36.0–46.0)
Hemoglobin: 11.8 g/dL — ABNORMAL LOW (ref 12.0–15.0)
MCH: 31.1 pg (ref 26.0–34.0)
MCHC: 35 g/dL (ref 30.0–36.0)
MCV: 88.9 fL (ref 78.0–100.0)
Platelets: 192 10*3/uL (ref 150–400)
RBC: 3.79 MIL/uL — AB (ref 3.87–5.11)
RDW: 13.6 % (ref 11.5–15.5)
WBC: 13 10*3/uL — ABNORMAL HIGH (ref 4.0–10.5)

## 2016-05-09 LAB — COMPREHENSIVE METABOLIC PANEL
ALT: 10 U/L — ABNORMAL LOW (ref 14–54)
AST: 17 U/L (ref 15–41)
Albumin: 3.6 g/dL (ref 3.5–5.0)
Alkaline Phosphatase: 61 U/L (ref 38–126)
Anion gap: 5 (ref 5–15)
BUN: 10 mg/dL (ref 6–20)
CHLORIDE: 104 mmol/L (ref 101–111)
CO2: 26 mmol/L (ref 22–32)
CREATININE: 0.68 mg/dL (ref 0.44–1.00)
Calcium: 9 mg/dL (ref 8.9–10.3)
GFR calc non Af Amer: >60 mL/min (ref >60)
Glucose, Bld: 91 mg/dL (ref 65–99)
Potassium: 3.5 mmol/L (ref 3.5–5.1)
SODIUM: 135 mmol/L (ref 135–145)
Total Bilirubin: 0.4 mg/dL (ref 0.3–1.2)
Total Protein: 7.1 g/dL (ref 6.5–8.1)

## 2016-05-09 LAB — WET PREP, GENITAL
Clue Cells Wet Prep HPF POC: NONE SEEN
Sperm: NONE SEEN
Trich, Wet Prep: NONE SEEN
Yeast Wet Prep HPF POC: NONE SEEN

## 2016-05-09 MED ORDER — CEPHALEXIN 500 MG PO CAPS: 500.0000 mg | ORAL_CAPSULE | Freq: Four times a day (QID) | ORAL | 0 refills | Status: DC

## 2016-05-09 MED ORDER — ACETAMINOPHEN 500 MG PO TABS
1000.0000 mg | ORAL_TABLET | Freq: Once | ORAL | Status: AC
  Administered 2016-05-09: 1000 mg via ORAL
  Filled 2016-05-09: qty 2

## 2016-05-09 NOTE — MAU Provider Note (Signed)
History     CSN: [id number]  Arrival date and time: 05/09/16 2051   First Provider Initiated Contact with Patient 05/09/16 2115      Chief Complaint  Patient presents with  . Abdominal Pain   Abdominal Pain  This is a new problem. The current episode started today (around 1500 ). The onset quality is sudden. The problem occurs intermittently. The problem has been unchanged. The pain is located in the RLQ. The pain is at a severity of 10/10. The quality of the pain is sharp and aching. The abdominal pain does not radiate. Pertinent negatives include no constipation (last BM earlier today around 1700. She states that the pain became worse after the BM), dysuria, fever, frequency, nausea or vomiting. Nothing aggravates the pain. The pain is relieved by nothing. She has tried nothing for the symptoms.   Past Medical History:  Diagnosis Date  . Asthma   . Scoliosis     Past Surgical History:  Procedure Laterality Date  . NO PAST SURGERIES      Family History  Problem Relation Age of Onset  . Diabetes Father     Social History  Substance Use Topics  . Smoking status: Never Smoker  . Smokeless tobacco: Never Used  . Alcohol use No    Allergies: No Known Allergies  Prescriptions Prior to Admission  Medication Sig Dispense Refill Last Dose  . Prenatal Multivit-Min-Fe-FA (PRENATAL VITAMINS) 0.8 MG tablet Take 1 tablet by mouth daily. 30 tablet 12 05/09/2016 at Unknown time  . promethazine (PHENERGAN) 25 MG tablet Take 1 tablet (25 mg total) by mouth every 6 (six) hours as needed for nausea or vomiting. (Patient not taking: Reported on 05/04/2016) 30 tablet 2 Not Taking    Review of Systems  Constitutional: Negative for chills and fever.  Gastrointestinal: Positive for abdominal pain. Negative for constipation (last BM earlier today around 1700. She states that the pain became worse after the BM), nausea and vomiting.  Genitourinary: Negative for dysuria, frequency, urgency,  vaginal bleeding and vaginal discharge.   Physical Exam   Blood pressure 139/80, pulse 81, resp. rate 20, height 5\' 7"  (1.702 m), weight 192 lb (87.1 kg), last menstrual period 12/14/2015.  Physical Exam  Nursing note and vitals reviewed. Constitutional: She is oriented to person, place, and time. She appears well-developed and well-nourished. No distress.  HENT:  Head: Normocephalic.  Cardiovascular: Normal rate.   Respiratory: Effort normal.  GI: Soft. There is no tenderness. There is no rebound.  Genitourinary:  Genitourinary Comments:  External: no lesion Vagina: small amount of white discharge Cervix: pink, smooth, no CMT, closed/thick  Uterus: AGA, FHT 147 with doppler   Neurological: She is alert and oriented to person, place, and time.  Skin: Skin is warm and dry.  Psychiatric: She has a normal mood and affect.     Results for orders placed or performed during the hospital encounter of 05/09/16 (from the past 24 hour(s))  Urinalysis, Routine w reflex microscopic     Status: Abnormal   Collection Time: 05/09/16  9:00 PM  Result Value Ref Range   Color, Urine STRAW (A) YELLOW   APPearance CLEAR CLEAR   Specific Gravity, Urine 1.006 1.005 - 1.030   pH 6.0 5.0 - 8.0   Glucose, UA NEGATIVE NEGATIVE mg/dL   Hgb urine dipstick MODERATE (A) NEGATIVE   Bilirubin Urine NEGATIVE NEGATIVE   Ketones, ur NEGATIVE NEGATIVE mg/dL   Protein, ur NEGATIVE NEGATIVE mg/dL   Nitrite NEGATIVE NEGATIVE  Leukocytes, UA TRACE (A) NEGATIVE   RBC / HPF 0-5 0 - 5 RBC/hpf   WBC, UA 0-5 0 - 5 WBC/hpf   Bacteria, UA RARE (A) NONE SEEN   Squamous Epithelial / LPF 6-30 (A) NONE SEEN  Wet prep, genital     Status: Abnormal   Collection Time: 05/09/16  9:25 PM  Result Value Ref Range   Yeast Wet Prep HPF POC NONE SEEN NONE SEEN   Trich, Wet Prep NONE SEEN NONE SEEN   Clue Cells Wet Prep HPF POC NONE SEEN NONE SEEN   WBC, Wet Prep HPF POC MANY (A) NONE SEEN   Sperm NONE SEEN   CBC      Status: Abnormal   Collection Time: 05/09/16  9:40 PM  Result Value Ref Range   WBC 13.0 (H) 4.0 - 10.5 K/uL   RBC 3.79 (L) 3.87 - 5.11 MIL/uL   Hemoglobin 11.8 (L) 12.0 - 15.0 g/dL   HCT 33.7 (L) 36.0 - 46.0 %   MCV 88.9 78.0 - 100.0 fL   MCH 31.1 26.0 - 34.0 pg   MCHC 35.0 30.0 - 36.0 g/dL   RDW 13.6 11.5 - 15.5 %   Platelets 192 150 - 400 K/uL  Comprehensive metabolic panel     Status: Abnormal   Collection Time: 05/09/16  9:40 PM  Result Value Ref Range   Sodium 135 135 - 145 mmol/L   Potassium 3.5 3.5 - 5.1 mmol/L   Chloride 104 101 - 111 mmol/L   CO2 26 22 - 32 mmol/L   Glucose, Bld 91 65 - 99 mg/dL   BUN 10 6 - 20 mg/dL   Creatinine, Ser 0.68 0.44 - 1.00 mg/dL   Calcium 9.0 8.9 - 10.3 mg/dL   Total Protein 7.1 6.5 - 8.1 g/dL   Albumin 3.6 3.5 - 5.0 g/dL   AST 17 15 - 41 U/L   ALT 10 (L) 14 - 54 U/L   Alkaline Phosphatase 61 38 - 126 U/L   Total Bilirubin 0.4 0.3 - 1.2 mg/dL   GFR calc non Af Amer >60 >60 mL/min   GFR calc Af Amer >60 >60 mL/min   Anion gap 5 5 - 15    MAU Course  Procedures  MDM Patient has had tylenol, and reports that her pain is better at this time.   Assessment and Plan   1. UTI in pregnancy, antepartum, second trimester   2. Round ligament pain   3. [redacted] weeks gestation of pregnancy    DC home Comfort measures reviewed  2nd/3rd Trimester precautions  PTL precautions  Fetal kick counts RX: kelfex QID #28  Return to MAU as needed FU with OB as planned  [place] for [place] Follow up.   Specialty:  Obstetrics and Gynecology Contact information: [place] [phone]           Mathis Bud 05/09/2016, 9:21 PM

## 2016-05-09 NOTE — MAU Note (Signed)
Pt c/o right sided pain that started at 3pm. States pain is sharp and constant. Rates 10/10. Has not taken anything for pain. Pt denies vaginal bleeding or LOF. Some white discharge, which is normal for her. Denies urinary s/s or n/v/d. LBM: today.

## 2016-05-09 NOTE — Discharge Instructions (Signed)
Round Ligament Pain during Pregnancy Many women will experience a type of pain referred to as round ligament pain during their pregnancy. This is associated with abdominal pain or discomfort. Since any type of abdominal pain during pregnancy can be disconcerting, it is important to talk about round ligament pain to relieve any anxiety or fears you may have regarding the symptoms you are feeling. Round ligament pain is due to normal changes that take place in the body during pregnancy. It is caused by stretching of the round ligaments attached to the uterus. More commonly it occurs on the right side of the pelvis. Round Ligament: An Overview Typically in the non-pregnant state the uterus is about the size of an apple or pear. There are thick ligaments which hold the uterus in place in the abdomen, referred to as round ligaments. During pregnancy, your uterus will expand in size and weight, and the ligaments supporting it will have to stretch, becoming longer and thinner. As these ligaments pull and tug they may irritate nearby nerve fibers, which causes pain. The severity of the pain in some cases can seem extreme. Some common symptoms of round ligament pain include:  Ligament spasms or contractions/cramps that trigger a sharp pain typically on the right side of the abdomen.  Pain upon waking or suddenly rolling over in your sleep.  Pain in the abdomen that is sharp brought on by exercise or other vigorous activity. Similar Problems Round ligament pain is often mistaken for other medical conditions because the symptoms are similar. Acute abdominal pain during pregnancy may also be a sign of other conditions including:  Abdominal cramps - Some abdominal pain is simply caused by change in bowel habits associated with pregnancy. Gas is a common problem that can cause sharp, shooting pain. You should always seek out medical care if your pain is accompanied by fever, chills, pain  upon urination or if you have difficulty walking. Further exams and tests will be conducted to ensure that you do not have a more serious condition. It is not uncommon for women with lower abdominal pain to have a urinary tract infection, thus you may also be asked for a urine sample. Treatment If all other conditions are ruled out you can treat your round ligament pain relatively easily. You may be advised to take some acetaminophen (Tylenol) to reduce the severity of any persistent pain and asked to reduce your activity level. You can apply a heating pad to the area of pain or take a warm bath. Lying on the opposite side of the pain may help as well. Most women will find relief from round ligament pain simply by altering their daily routines slightly. The good news is round ligament pain will disappear completely once you have given birth to your child!  Pregnancy and Urinary Tract Infection What is a urinary tract infection? A urinary tract infection (UTI) is an infection of any part of the urinary tract. This includes the kidneys, the tubes that connect your kidneys to your bladder (ureters), the bladder, and the tube that carries urine out of your body (urethra). These organs make, store, and get rid of urine in the body. A UTI can be a bladder infection (cystitis) or a kidney infection (pyelonephritis). This infection may be caused by fungi, viruses, and bacteria. Bacteria are the most common cause of UTIs. You are more likely to develop a UTI during pregnancy because:  The physical and hormonal changes your body goes through can make it easier for  bacteria to get into your urinary tract.  Your growing baby puts pressure on your uterus and can affect urine flow. Does a UTI place my baby at risk? An untreated UTI during pregnancy could lead to a kidney infection, which can cause health problems that could affect your baby. Possible complications of an untreated UTI include:  Having your  baby before 37 weeks of pregnancy (premature).  Having a baby with a low birth weight.  Developing high blood pressure during pregnancy (preeclampsia). What are the symptoms of a UTI? Symptoms of a UTI include:  Fever.  Frequent urination or passing small amounts of urine frequently.  Needing to urinate urgently.  Pain or a burning sensation with urination.  Urine that smells bad or unusual.  Cloudy urine.  Pain in the lower abdomen or back.  Trouble urinating.  Blood in the urine.  Vomiting or being less hungry than normal.  Diarrhea or abdominal pain.  Vaginal discharge. What are the treatment options for a UTI during pregnancy? Treatment for this condition may include:  Antibiotic medicines that are safe to take during pregnancy.  Other medicines to treat less common causes of UTI. How can I prevent a UTI?   To prevent a UTI:  Go to the bathroom as soon as you feel the need.  Always wipe from front to back.  Wash your genital area with soap and warm water daily.  Empty your bladder before and after sex.  Wear cotton underwear.  Limit your intake of high sugar foods or drinks, such as regular soda, juice, and sweets.  Drink 6-8 glasses of water daily.  Do not wear tight-fitting pants.  Do not douche or use deodorant sprays.  Do not drink alcohol, caffeine, or carbonated drinks. These can irritate the bladder. Contact a health care provider if:  Your symptoms do not improve or get worse.  You have a fever after two days of treatment.  You have a rash.  You have abnormal vaginal discharge.  You have back or side pain.  You have chills.  You have nausea and vomiting. Get help right away if: Seek immediate medical care if you are pregnant and:  You feel contractions in your uterus.  You have lower belly pain.  You have a gush of fluid from your vagina.  You have blood in your urine.  You are vomiting and cannot keep down any  medicines or water. This information is not intended to replace advice given to you by your health care provider. Make sure you discuss any questions you have with your health care provider. Document Released: 05/28/2010 Document Revised: 01/15/2016 Document Reviewed: 12/22/2014 Elsevier Interactive Patient Education  2017 [employer].

## 2016-05-10 LAB — GC/CHLAMYDIA PROBE AMP (~~LOC~~) NOT AT ARMC
Chlamydia: NEGATIVE
NEISSERIA GONORRHEA: NEGATIVE

## 2016-05-11 LAB — CULTURE, OB URINE

## 2016-06-01 ENCOUNTER — Ambulatory Visit (INDEPENDENT_AMBULATORY_CARE_PROVIDER_SITE_OTHER): Payer: Medicaid Other | Admitting: Obstetrics & Gynecology

## 2016-06-01 DIAGNOSIS — Z3402 Encounter for supervision of normal first pregnancy, second trimester: Secondary | ICD-10-CM

## 2016-06-01 DIAGNOSIS — Z3400 Encounter for supervision of normal first pregnancy, unspecified trimester: Secondary | ICD-10-CM

## 2016-06-01 NOTE — Patient Instructions (Signed)
Second Trimester of Pregnancy The second trimester is from week 13 through week 28, month 4 through 6. This is often the time in pregnancy that you feel your best. Often times, morning sickness has lessened or quit. You may have more energy, and you may get hungry more often. Your unborn baby (fetus) is growing rapidly. At the end of the sixth month, he or she is about 9 inches long and weighs about 1 pounds. You will likely feel the baby move (quickening) between 18 and 20 weeks of pregnancy. Follow these instructions at home:  Avoid all smoking, herbs, and alcohol. Avoid drugs not approved by your doctor.  Do not use any tobacco products, including cigarettes, chewing tobacco, and electronic cigarettes. If you need help quitting, ask your doctor. You may get counseling or other support to help you quit.  Only take medicine as told by your doctor. Some medicines are safe and some are not during pregnancy.  Exercise only as told by your doctor. Stop exercising if you start having cramps.  Eat regular, healthy meals.  Wear a good support bra if your breasts are tender.  Do not use hot tubs, steam rooms, or saunas.  Wear your seat belt when driving.  Avoid raw meat, uncooked cheese, and liter boxes and soil used by cats.  Take your prenatal vitamins.  Take 1500-2000 milligrams of calcium daily starting at the 20th week of pregnancy until you deliver your baby.  Try taking medicine that helps you poop (stool softener) as needed, and if your doctor approves. Eat more fiber by eating fresh fruit, vegetables, and whole grains. Drink enough fluids to keep your pee (urine) clear or pale yellow.  Take warm water baths (sitz baths) to soothe pain or discomfort caused by hemorrhoids. Use hemorrhoid cream if your doctor approves.  If you have puffy, bulging veins (varicose veins), wear support hose. Raise (elevate) your feet for 15 minutes, 3-4 times a day. Limit salt in your diet.  Avoid heavy  lifting, wear low heals, and sit up straight.  Rest with your legs raised if you have leg cramps or low back pain.  Visit your dentist if you have not gone during your pregnancy. Use a soft toothbrush to brush your teeth. Be gentle when you floss.  You can have sex (intercourse) unless your doctor tells you not to.  Go to your doctor visits. Get help if:  You feel dizzy.  You have mild cramps or pressure in your lower belly (abdomen).  You have a nagging pain in your belly area.  You continue to feel sick to your stomach (nauseous), throw up (vomit), or have watery poop (diarrhea).  You have bad smelling fluid coming from your vagina.  You have pain with peeing (urination). Get help right away if:  You have a fever.  You are leaking fluid from your vagina.  You have spotting or bleeding from your vagina.  You have severe belly cramping or pain.  You lose or gain weight rapidly.  You have trouble catching your breath and have chest pain.  You notice sudden or extreme puffiness (swelling) of your face, hands, ankles, feet, or legs.  You have not felt the baby move in over an hour.  You have severe headaches that do not go away with medicine.  You have vision changes. This information is not intended to replace advice given to you by your health care provider. Make sure you discuss any questions you have with your health care   provider. Document Released: 04/27/2009 Document Revised: 07/09/2015 Document Reviewed: 04/03/2012 Elsevier Interactive Patient Education  2017 [employer]  

## 2016-06-01 NOTE — Progress Notes (Signed)
Patient reports good fetal movement, denies pain. 

## 2016-06-01 NOTE — Progress Notes (Signed)
   PRENATAL VISIT NOTE  Subjective:  Megan Schneider is a 26 y.o. G1P0 at [redacted]w[redacted]d being seen today for ongoing prenatal care.  She is currently monitored for the following issues for this low-risk pregnancy and has Supervision of normal pregnancy, antepartum on her problem list.  Patient reports no complaints.   .  .   . Denies leaking of fluid.   The following portions of the patient's history were reviewed and updated as appropriate: allergies, current medications, past family history, past medical history, past social history, past surgical history and problem list. Problem list updated.  Objective:   Vitals:   06/01/16 0936  BP: 116/76  Pulse: 88  Weight: 198 lb 5 oz (90 kg)    Fetal Status: Fetal Heart Rate (bpm): 143         General:  Alert, oriented and cooperative. Patient is in no acute distress.  Skin: Skin is warm and dry. No rash noted.   Cardiovascular: Normal heart rate noted  Respiratory: Normal respiratory effort, no problems with respiration noted  Abdomen: Soft, gravid, appropriate for gestational age.       Pelvic:  Cervical exam deferred        Extremities: Normal range of motion.     Mental Status: Normal mood and affect. Normal behavior. Normal judgment and thought content.   Assessment and Plan:  Pregnancy: G1P0 at [redacted]w[redacted]d  1. Supervision of normal first pregnancy, antepartum [place] will have f/u in 2 days  Preterm labor symptoms and general obstetric precautions including but not limited to vaginal bleeding, contractions, leaking of fluid and fetal movement were reviewed in detail with the patient. Please refer to After Visit Summary for other counseling recommendations.  Return in about 4 weeks (around 06/29/2016) for 2 hr gtt.   Woodroe Mode, MD

## 2016-06-03 ENCOUNTER — Ambulatory Visit (HOSPITAL_COMMUNITY)
Admission: RE | Admit: 2016-06-03 | Discharge: 2016-06-03 | Disposition: A | Discharge status: Home / Self Care | Payer: Medicaid Other | Source: Ambulatory Visit | Attending: Obstetrics and Gynecology | Admitting: Obstetrics and Gynecology

## 2016-06-03 DIAGNOSIS — Z3482 Encounter for supervision of other normal pregnancy, second trimester: Secondary | ICD-10-CM | POA: Diagnosis not present

## 2016-06-03 DIAGNOSIS — Z3A24 24 weeks gestation of pregnancy: Secondary | ICD-10-CM | POA: Insufficient documentation

## 2016-06-03 DIAGNOSIS — Z3490 Encounter for supervision of normal pregnancy, unspecified, unspecified trimester: Secondary | ICD-10-CM

## 2016-06-03 DIAGNOSIS — Z3480 Encounter for supervision of other normal pregnancy, unspecified trimester: Secondary | ICD-10-CM | POA: Diagnosis present

## 2016-06-29 ENCOUNTER — Ambulatory Visit (INDEPENDENT_AMBULATORY_CARE_PROVIDER_SITE_OTHER): Payer: Medicaid Other | Admitting: Obstetrics and Gynecology

## 2016-06-29 VITALS — BP 125/76 | HR 101 | Wt 201.2 lb

## 2016-06-29 DIAGNOSIS — L723 Sebaceous cyst: Secondary | ICD-10-CM | POA: Insufficient documentation

## 2016-06-29 DIAGNOSIS — D239 Other benign neoplasm of skin, unspecified: Secondary | ICD-10-CM | POA: Insufficient documentation

## 2016-06-29 DIAGNOSIS — Z3400 Encounter for supervision of normal first pregnancy, unspecified trimester: Secondary | ICD-10-CM

## 2016-06-29 DIAGNOSIS — Z3403 Encounter for supervision of normal first pregnancy, third trimester: Secondary | ICD-10-CM

## 2016-06-29 DIAGNOSIS — Z23 Encounter for immunization: Secondary | ICD-10-CM | POA: Diagnosis not present

## 2016-06-29 MED ORDER — CEPHALEXIN 500 MG PO CAPS: 500.0000 mg | ORAL_CAPSULE | Freq: Three times a day (TID) | ORAL | 0 refills | Status: DC

## 2016-06-29 NOTE — Progress Notes (Signed)
Patient reports she is having boils on her inner thighs.

## 2016-06-29 NOTE — Progress Notes (Signed)
Subjective:  Megan Schneider is a 26 y.o. G1P0 at [redacted]w[redacted]d being seen today for ongoing prenatal care.  She is currently monitored for the following issues for this low-risk pregnancy and has Supervision of normal pregnancy, antepartum and Sebaceous cyst on her problem list.  Patient reports "boils" on her inner thigh x 3 .  Contractions: Not present. Vag. Bleeding: None.  Movement: Present. Denies leaking of fluid.   The following portions of the patient's history were reviewed and updated as appropriate: allergies, current medications, past family history, past medical history, past social history, past surgical history and problem list. Problem list updated.  Objective:   Vitals:   06/29/16 0947  BP: 125/76  Pulse: (!) 101  Weight: 201 lb 3.2 oz (91.3 kg)    Fetal Status: Fetal Heart Rate (bpm): 145   Movement: Present     General:  Alert, oriented and cooperative. Patient is in no acute distress.  Skin: Skin is warm and dry. No rash noted.   Cardiovascular: Normal heart rate noted  Respiratory: Normal respiratory effort, no problems with respiration noted  Abdomen: Soft, gravid, appropriate for gestational age. Pain/Pressure: Absent     Pelvic:  Cervical exam deferred        Extremities: Normal range of motion.  Edema: None  Mental Status: Normal mood and affect. Normal behavior. Normal judgment and thought content.   Urinalysis:      Assessment and Plan:  Pregnancy: G1P0 at 103w2d  1. Supervision of normal first pregnancy, antepartum Stable, 28 wk labs today - Glucose Tolerance, 2 Hours w/1 Hour - CBC - RPR - HIV antibody (with reflex) - Tdap vaccine greater than or equal to 7yo IM  2. Sebaceous cyst General care with warm compress and antibiotic onit reviewed  - cephALEXin (KEFLEX) 500 MG capsule; Take 1 capsule (500 mg total) by mouth 3 (three) times daily.  Dispense: 21 capsule; Refill: 0  Preterm labor symptoms and general obstetric precautions including but not  limited to vaginal bleeding, contractions, leaking of fluid and fetal movement were reviewed in detail with the patient. Please refer to After Visit Summary for other counseling recommendations.  Return in about 2 weeks (around 07/13/2016) for OB visit.   Chancy Milroy, MD

## 2016-06-30 LAB — GLUCOSE TOLERANCE, 2 HOURS W/ 1HR
GLUCOSE, FASTING: 85 mg/dL (ref 65–91)
Glucose, 1 hour: 113 mg/dL (ref 65–179)
Glucose, 2 hour: 117 mg/dL (ref 65–152)

## 2016-06-30 LAB — CBC
HEMATOCRIT: 36.2 % (ref 34.0–46.6)
Hemoglobin: 12.1 g/dL (ref 11.1–15.9)
MCH: 30.8 pg (ref 26.6–33.0)
MCHC: 33.4 g/dL (ref 31.5–35.7)
MCV: 92 fL (ref 79–97)
Platelets: 184 10*3/uL (ref 150–379)
RBC: 3.93 x10E6/uL (ref 3.77–5.28)
RDW: 14 % (ref 12.3–15.4)
WBC: 11.3 10*3/uL — ABNORMAL HIGH (ref 3.4–10.8)

## 2016-06-30 LAB — RPR: RPR Ser Ql: NONREACTIVE

## 2016-06-30 LAB — HIV ANTIBODY (ROUTINE TESTING W REFLEX): HIV Screen 4th Generation wRfx: NONREACTIVE

## 2016-07-13 ENCOUNTER — Ambulatory Visit (INDEPENDENT_AMBULATORY_CARE_PROVIDER_SITE_OTHER): Payer: Medicaid Other | Admitting: Obstetrics & Gynecology

## 2016-07-13 DIAGNOSIS — Z3403 Encounter for supervision of normal first pregnancy, third trimester: Secondary | ICD-10-CM

## 2016-07-13 DIAGNOSIS — Z3400 Encounter for supervision of normal first pregnancy, unspecified trimester: Secondary | ICD-10-CM

## 2016-07-13 NOTE — Patient Instructions (Signed)
Third Trimester of Pregnancy The third trimester is from week 28 through week 40 (months 7 through 9). The third trimester is a time when the unborn baby (fetus) is growing rapidly. At the end of the ninth month, the fetus is about 20 inches in length and weighs 6-10 pounds. Body changes during your third trimester Your body will continue to go through many changes during pregnancy. The changes vary from woman to woman. During the third trimester:  Your weight will continue to increase. You can expect to gain 25-35 pounds (11-16 kg) by the end of the pregnancy.  You may begin to get stretch marks on your hips, abdomen, and breasts.  You may urinate more often because the fetus is moving lower into your pelvis and pressing on your bladder.  You may develop or continue to have heartburn. This is caused by increased hormones that slow down muscles in the digestive tract.  You may develop or continue to have constipation because increased hormones slow digestion and cause the muscles that push waste through your intestines to relax.  You may develop hemorrhoids. These are swollen veins (varicose veins) in the rectum that can itch or be painful.  You may develop swollen, bulging veins (varicose veins) in your legs.  You may have increased body aches in the pelvis, back, or thighs. This is due to weight gain and increased hormones that are relaxing your joints.  You may have changes in your hair. These can include thickening of your hair, rapid growth, and changes in texture. Some women also have hair loss during or after pregnancy, or hair that feels dry or thin. Your hair will most likely return to normal after your baby is born.  Your breasts will continue to grow and they will continue to become tender. A yellow fluid (colostrum) may leak from your breasts. This is the first milk you are producing for your baby.  Your belly button may stick out.  You may notice more swelling in your hands,  face, or ankles.  You may have increased tingling or numbness in your hands, arms, and legs. The skin on your belly may also feel numb.  You may feel short of breath because of your expanding uterus.  You may have more problems sleeping. This can be caused by the size of your belly, increased need to urinate, and an increase in your body's metabolism.  You may notice the fetus "dropping," or moving lower in your abdomen (lightening).  You may have increased vaginal discharge.  You may notice your joints feel loose and you may have pain around your pelvic bone.  What to expect at prenatal visits You will have prenatal exams every 2 weeks until week 36. Then you will have weekly prenatal exams. During a routine prenatal visit:  You will be weighed to make sure you and the baby are growing normally.  Your blood pressure will be taken.  Your abdomen will be measured to track your baby's growth.  The fetal heartbeat will be listened to.  Any test results from the previous visit will be discussed.  You may have a cervical check near your due date to see if your cervix has softened or thinned (effaced).  You will be tested for Group B streptococcus. This happens between 35 and 37 weeks.  Your health care provider may ask you:  What your birth plan is.  How you are feeling.  If you are feeling the baby move.  If you have had   any abnormal symptoms, such as leaking fluid, bleeding, severe headaches, or abdominal cramping.  If you are using any tobacco products, including cigarettes, chewing tobacco, and electronic cigarettes.  If you have any questions.  Other tests or screenings that may be performed during your third trimester include:  Blood tests that check for low iron levels (anemia).  Fetal testing to check the health, activity level, and growth of the fetus. Testing is done if you have certain medical conditions or if there are problems during the  pregnancy.  Nonstress test (NST). This test checks the health of your baby to make sure there are no signs of problems, such as the baby not getting enough oxygen. During this test, a belt is placed around your belly. The baby is made to move, and its heart rate is monitored during movement.  What is false labor? False labor is a condition in which you feel small, irregular tightenings of the muscles in the womb (contractions) that usually go away with rest, changing position, or drinking water. These are called Braxton Hicks contractions. Contractions may last for hours, days, or even weeks before true labor sets in. If contractions come at regular intervals, become more frequent, increase in intensity, or become painful, you should see your health care provider. What are the signs of labor?  Abdominal cramps.  Regular contractions that start at 10 minutes apart and become stronger and more frequent with time.  Contractions that start on the top of the uterus and spread down to the lower abdomen and back.  Increased pelvic pressure and dull back pain.  A watery or bloody mucus discharge that comes from the vagina.  Leaking of amniotic fluid. This is also known as your "water breaking." It could be a slow trickle or a gush. Let your health care provider know if it has a color or strange odor. If you have any of these signs, call your health care provider right away, even if it is before your due date. Follow these instructions at home: Medicines  Follow your health care provider's instructions regarding medicine use. Specific medicines may be either safe or unsafe to take during pregnancy.  Take a prenatal vitamin that contains at least 600 micrograms (mcg) of folic acid.  If you develop constipation, try taking a stool softener if your health care provider approves. Eating and drinking  Eat a balanced diet that includes fresh fruits and vegetables, whole grains, good sources of protein  such as meat, eggs, or tofu, and low-fat dairy. Your health care provider will help you determine the amount of weight gain that is right for you.  Avoid raw meat and uncooked cheese. These carry germs that can cause birth defects in the baby.  If you have low calcium intake from food, talk to your health care provider about whether you should take a daily calcium supplement.  Eat four or five small meals rather than three large meals a day.  Limit foods that are high in fat and processed sugars, such as fried and sweet foods.  To prevent constipation: ? Drink enough fluid to keep your urine clear or pale yellow. ? Eat foods that are high in fiber, such as fresh fruits and vegetables, whole grains, and beans. Activity  Exercise only as directed by your health care provider. Most women can continue their usual exercise routine during pregnancy. Try to exercise for 30 minutes at least 5 days a week. Stop exercising if you experience uterine contractions.  Avoid heavy   lifting.  Do not exercise in extreme heat or humidity, or at high altitudes.  Wear low-heel, comfortable shoes.  Practice good posture.  You may continue to have sex unless your health care provider tells you otherwise. Relieving pain and discomfort  Take frequent breaks and rest with your legs elevated if you have leg cramps or low back pain.  Take warm sitz baths to soothe any pain or discomfort caused by hemorrhoids. Use hemorrhoid cream if your health care provider approves.  Wear a good support bra to prevent discomfort from breast tenderness.  If you develop varicose veins: ? Wear support pantyhose or compression stockings as told by your healthcare provider. ? Elevate your feet for 15 minutes, 3-4 times a day. Prenatal care  Write down your questions. Take them to your prenatal visits.  Keep all your prenatal visits as told by your health care provider. This is important. Safety  Wear your seat belt at  all times when driving.  Make a list of emergency phone numbers, including numbers for family, friends, the hospital, and police and fire departments. General instructions  Avoid cat litter boxes and soil used by cats. These carry germs that can cause birth defects in the baby. If you have a cat, ask someone to clean the litter box for you.  Do not travel far distances unless it is absolutely necessary and only with the approval of your health care provider.  Do not use hot tubs, steam rooms, or saunas.  Do not drink alcohol.  Do not use any products that contain nicotine or tobacco, such as cigarettes and e-cigarettes. If you need help quitting, ask your health care provider.  Do not use any medicinal herbs or unprescribed drugs. These chemicals affect the formation and growth of the baby.  Do not douche or use tampons or scented sanitary pads.  Do not cross your legs for long periods of time.  To prepare for the arrival of your baby: ? Take prenatal classes to understand, practice, and ask questions about labor and delivery. ? Make a trial run to the hospital. ? Visit the hospital and tour the maternity area. ? Arrange for maternity or paternity leave through employers. ? Arrange for family and friends to take care of pets while you are in the hospital. ? Purchase a rear-facing car seat and make sure you know how to install it in your car. ? Pack your hospital bag. ? Prepare the baby's nursery. Make sure to remove all pillows and stuffed animals from the baby's crib to prevent suffocation.  Visit your dentist if you have not gone during your pregnancy. Use a soft toothbrush to brush your teeth and be gentle when you floss. Contact a health care provider if:  You are unsure if you are in labor or if your water has broken.  You become dizzy.  You have mild pelvic cramps, pelvic pressure, or nagging pain in your abdominal area.  You have lower back pain.  You have persistent  nausea, vomiting, or diarrhea.  You have an unusual or bad smelling vaginal discharge.  You have pain when you urinate. Get help right away if:  Your water breaks before 37 weeks.  You have regular contractions less than 5 minutes apart before 37 weeks.  You have a fever.  You are leaking fluid from your vagina.  You have spotting or bleeding from your vagina.  You have severe abdominal pain or cramping.  You have rapid weight loss or weight gain.    You have shortness of breath with chest pain.  You notice sudden or extreme swelling of your face, hands, ankles, feet, or legs.  Your baby makes fewer than 10 movements in 2 hours.  You have severe headaches that do not go away when you take medicine.  You have vision changes. Summary  The third trimester is from week 28 through week 40, months 7 through 9. The third trimester is a time when the unborn baby (fetus) is growing rapidly.  During the third trimester, your discomfort may increase as you and your baby continue to gain weight. You may have abdominal, leg, and back pain, sleeping problems, and an increased need to urinate.  During the third trimester your breasts will keep growing and they will continue to become tender. A yellow fluid (colostrum) may leak from your breasts. This is the first milk you are producing for your baby.  False labor is a condition in which you feel small, irregular tightenings of the muscles in the womb (contractions) that eventually go away. These are called Braxton Hicks contractions. Contractions may last for hours, days, or even weeks before true labor sets in.  Signs of labor can include: abdominal cramps; regular contractions that start at 10 minutes apart and become stronger and more frequent with time; watery or bloody mucus discharge that comes from the vagina; increased pelvic pressure and dull back pain; and leaking of amniotic fluid. This information is not intended to replace advice  given to you by your health care provider. Make sure you discuss any questions you have with your health care provider. Document Released: 01/25/2001 Document Revised: 07/09/2015 Document Reviewed: 04/03/2012 Elsevier Interactive Patient Education  2017 [employer]  

## 2016-07-13 NOTE — Progress Notes (Signed)
   PRENATAL VISIT NOTE  Subjective:  Megan Schneider is a 26 y.o. G1P0 at [redacted]w[redacted]d being seen today for ongoing prenatal care.  She is currently monitored for the following issues for this low-risk pregnancy and has Supervision of normal pregnancy, antepartum and Sebaceous cyst on her problem list.  Patient reports no complaints.  Contractions: Not present. Vag. Bleeding: None.  Movement: Present. Denies leaking of fluid.   The following portions of the patient's history were reviewed and updated as appropriate: allergies, current medications, past family history, past medical history, past social history, past surgical history and problem list. Problem list updated.  Objective:   Vitals:   07/13/16 1419  BP: 127/79  Pulse: (!) 112  Weight: 206 lb (93.4 kg)    Fetal Status: Fetal Heart Rate (bpm): 141  Fundal Height: 31 cm Movement: Present     General:  Alert, oriented and cooperative. Patient is in no acute distress.  Skin: Skin is warm and dry. No rash noted.   Cardiovascular: Normal heart rate noted  Respiratory: Normal respiratory effort, no problems with respiration noted  Abdomen: Soft, gravid, appropriate for gestational age. Pain/Pressure: Absent     Pelvic:  Cervical exam deferred        Extremities: Normal range of motion.  Edema: Trace  Mental Status: Normal mood and affect. Normal behavior. Normal judgment and thought content.   Assessment and Plan:  Pregnancy: G1P0 at [redacted]w[redacted]d  1. Supervision of normal first pregnancy, antepartum Doing well  Preterm labor symptoms and general obstetric precautions including but not limited to vaginal bleeding, contractions, leaking of fluid and fetal movement were reviewed in detail with the patient. Please refer to After Visit Summary for other counseling recommendations.  Return in about 2 weeks (around 07/27/2016).   Emeterio Reeve, MD

## 2016-08-01 ENCOUNTER — Telehealth: Payer: Self-pay | Admitting: *Deleted

## 2016-08-01 NOTE — Telephone Encounter (Signed)
Pt called to office with questions about losing mucous plug.  Call placed to pt. Pt states she thinks she may have lost her mucous plug today but is unsure, wants to know if that's normal. Pt made aware that she may loose her plug at this point. Pt states that she may have increase in thicker mucous discharge, "snot like".  Pt made aware that there may or may not be color to her plug. Pt states she is feeling fine, denies any ctx, bleeding or LOF. Pt advised if she has any of these symptoms she should be seen at [facility] for eval. Pt has appt in office tomorrow, advised to keep appt and can discuss with provider if needing further info.  Pt states understanding.

## 2016-08-02 ENCOUNTER — Ambulatory Visit (INDEPENDENT_AMBULATORY_CARE_PROVIDER_SITE_OTHER): Payer: Medicaid Other | Admitting: Obstetrics & Gynecology

## 2016-08-02 DIAGNOSIS — Z3400 Encounter for supervision of normal first pregnancy, unspecified trimester: Secondary | ICD-10-CM

## 2016-08-02 DIAGNOSIS — Z3403 Encounter for supervision of normal first pregnancy, third trimester: Secondary | ICD-10-CM

## 2016-08-02 NOTE — Patient Instructions (Signed)
Third Trimester of Pregnancy The third trimester is from week 28 through week 40 (months 7 through 9). The third trimester is a time when the unborn baby (fetus) is growing rapidly. At the end of the ninth month, the fetus is about 20 inches in length and weighs 6-10 pounds. Body changes during your third trimester Your body will continue to go through many changes during pregnancy. The changes vary from woman to woman. During the third trimester:  Your weight will continue to increase. You can expect to gain 25-35 pounds (11-16 kg) by the end of the pregnancy.  You may begin to get stretch marks on your hips, abdomen, and breasts.  You may urinate more often because the fetus is moving lower into your pelvis and pressing on your bladder.  You may develop or continue to have heartburn. This is caused by increased hormones that slow down muscles in the digestive tract.  You may develop or continue to have constipation because increased hormones slow digestion and cause the muscles that push waste through your intestines to relax.  You may develop hemorrhoids. These are swollen veins (varicose veins) in the rectum that can itch or be painful.  You may develop swollen, bulging veins (varicose veins) in your legs.  You may have increased body aches in the pelvis, back, or thighs. This is due to weight gain and increased hormones that are relaxing your joints.  You may have changes in your hair. These can include thickening of your hair, rapid growth, and changes in texture. Some women also have hair loss during or after pregnancy, or hair that feels dry or thin. Your hair will most likely return to normal after your baby is born.  Your breasts will continue to grow and they will continue to become tender. A yellow fluid (colostrum) may leak from your breasts. This is the first milk you are producing for your baby.  Your belly button may stick out.  You may notice more swelling in your hands,  face, or ankles.  You may have increased tingling or numbness in your hands, arms, and legs. The skin on your belly may also feel numb.  You may feel short of breath because of your expanding uterus.  You may have more problems sleeping. This can be caused by the size of your belly, increased need to urinate, and an increase in your body's metabolism.  You may notice the fetus "dropping," or moving lower in your abdomen (lightening).  You may have increased vaginal discharge.  You may notice your joints feel loose and you may have pain around your pelvic bone.  What to expect at prenatal visits You will have prenatal exams every 2 weeks until week 36. Then you will have weekly prenatal exams. During a routine prenatal visit:  You will be weighed to make sure you and the baby are growing normally.  Your blood pressure will be taken.  Your abdomen will be measured to track your baby's growth.  The fetal heartbeat will be listened to.  Any test results from the previous visit will be discussed.  You may have a cervical check near your due date to see if your cervix has softened or thinned (effaced).  You will be tested for Group B streptococcus. This happens between 35 and 37 weeks.  Your health care provider may ask you:  What your birth plan is.  How you are feeling.  If you are feeling the baby move.  If you have had   any abnormal symptoms, such as leaking fluid, bleeding, severe headaches, or abdominal cramping.  If you are using any tobacco products, including cigarettes, chewing tobacco, and electronic cigarettes.  If you have any questions.  Other tests or screenings that may be performed during your third trimester include:  Blood tests that check for low iron levels (anemia).  Fetal testing to check the health, activity level, and growth of the fetus. Testing is done if you have certain medical conditions or if there are problems during the  pregnancy.  Nonstress test (NST). This test checks the health of your baby to make sure there are no signs of problems, such as the baby not getting enough oxygen. During this test, a belt is placed around your belly. The baby is made to move, and its heart rate is monitored during movement.  What is false labor? False labor is a condition in which you feel small, irregular tightenings of the muscles in the womb (contractions) that usually go away with rest, changing position, or drinking water. These are called Braxton Hicks contractions. Contractions may last for hours, days, or even weeks before true labor sets in. If contractions come at regular intervals, become more frequent, increase in intensity, or become painful, you should see your health care provider. What are the signs of labor?  Abdominal cramps.  Regular contractions that start at 10 minutes apart and become stronger and more frequent with time.  Contractions that start on the top of the uterus and spread down to the lower abdomen and back.  Increased pelvic pressure and dull back pain.  A watery or bloody mucus discharge that comes from the vagina.  Leaking of amniotic fluid. This is also known as your "water breaking." It could be a slow trickle or a gush. Let your health care provider know if it has a color or strange odor. If you have any of these signs, call your health care provider right away, even if it is before your due date. Follow these instructions at home: Medicines  Follow your health care provider's instructions regarding medicine use. Specific medicines may be either safe or unsafe to take during pregnancy.  Take a prenatal vitamin that contains at least 600 micrograms (mcg) of folic acid.  If you develop constipation, try taking a stool softener if your health care provider approves. Eating and drinking  Eat a balanced diet that includes fresh fruits and vegetables, whole grains, good sources of protein  such as meat, eggs, or tofu, and low-fat dairy. Your health care provider will help you determine the amount of weight gain that is right for you.  Avoid raw meat and uncooked cheese. These carry germs that can cause birth defects in the baby.  If you have low calcium intake from food, talk to your health care provider about whether you should take a daily calcium supplement.  Eat four or five small meals rather than three large meals a day.  Limit foods that are high in fat and processed sugars, such as fried and sweet foods.  To prevent constipation: ? Drink enough fluid to keep your urine clear or pale yellow. ? Eat foods that are high in fiber, such as fresh fruits and vegetables, whole grains, and beans. Activity  Exercise only as directed by your health care provider. Most women can continue their usual exercise routine during pregnancy. Try to exercise for 30 minutes at least 5 days a week. Stop exercising if you experience uterine contractions.  Avoid heavy   lifting.  Do not exercise in extreme heat or humidity, or at high altitudes.  Wear low-heel, comfortable shoes.  Practice good posture.  You may continue to have sex unless your health care provider tells you otherwise. Relieving pain and discomfort  Take frequent breaks and rest with your legs elevated if you have leg cramps or low back pain.  Take warm sitz baths to soothe any pain or discomfort caused by hemorrhoids. Use hemorrhoid cream if your health care provider approves.  Wear a good support bra to prevent discomfort from breast tenderness.  If you develop varicose veins: ? Wear support pantyhose or compression stockings as told by your healthcare provider. ? Elevate your feet for 15 minutes, 3-4 times a day. Prenatal care  Write down your questions. Take them to your prenatal visits.  Keep all your prenatal visits as told by your health care provider. This is important. Safety  Wear your seat belt at  all times when driving.  Make a list of emergency phone numbers, including numbers for family, friends, the hospital, and police and fire departments. General instructions  Avoid cat litter boxes and soil used by cats. These carry germs that can cause birth defects in the baby. If you have a cat, ask someone to clean the litter box for you.  Do not travel far distances unless it is absolutely necessary and only with the approval of your health care provider.  Do not use hot tubs, steam rooms, or saunas.  Do not drink alcohol.  Do not use any products that contain nicotine or tobacco, such as cigarettes and e-cigarettes. If you need help quitting, ask your health care provider.  Do not use any medicinal herbs or unprescribed drugs. These chemicals affect the formation and growth of the baby.  Do not douche or use tampons or scented sanitary pads.  Do not cross your legs for long periods of time.  To prepare for the arrival of your baby: ? Take prenatal classes to understand, practice, and ask questions about labor and delivery. ? Make a trial run to the hospital. ? Visit the hospital and tour the maternity area. ? Arrange for maternity or paternity leave through employers. ? Arrange for family and friends to take care of pets while you are in the hospital. ? Purchase a rear-facing car seat and make sure you know how to install it in your car. ? Pack your hospital bag. ? Prepare the baby's nursery. Make sure to remove all pillows and stuffed animals from the baby's crib to prevent suffocation.  Visit your dentist if you have not gone during your pregnancy. Use a soft toothbrush to brush your teeth and be gentle when you floss. Contact a health care provider if:  You are unsure if you are in labor or if your water has broken.  You become dizzy.  You have mild pelvic cramps, pelvic pressure, or nagging pain in your abdominal area.  You have lower back pain.  You have persistent  nausea, vomiting, or diarrhea.  You have an unusual or bad smelling vaginal discharge.  You have pain when you urinate. Get help right away if:  Your water breaks before 37 weeks.  You have regular contractions less than 5 minutes apart before 37 weeks.  You have a fever.  You are leaking fluid from your vagina.  You have spotting or bleeding from your vagina.  You have severe abdominal pain or cramping.  You have rapid weight loss or weight gain.    You have shortness of breath with chest pain.  You notice sudden or extreme swelling of your face, hands, ankles, feet, or legs.  Your baby makes fewer than 10 movements in 2 hours.  You have severe headaches that do not go away when you take medicine.  You have vision changes. Summary  The third trimester is from week 28 through week 40, months 7 through 9. The third trimester is a time when the unborn baby (fetus) is growing rapidly.  During the third trimester, your discomfort may increase as you and your baby continue to gain weight. You may have abdominal, leg, and back pain, sleeping problems, and an increased need to urinate.  During the third trimester your breasts will keep growing and they will continue to become tender. A yellow fluid (colostrum) may leak from your breasts. This is the first milk you are producing for your baby.  False labor is a condition in which you feel small, irregular tightenings of the muscles in the womb (contractions) that eventually go away. These are called Braxton Hicks contractions. Contractions may last for hours, days, or even weeks before true labor sets in.  Signs of labor can include: abdominal cramps; regular contractions that start at 10 minutes apart and become stronger and more frequent with time; watery or bloody mucus discharge that comes from the vagina; increased pelvic pressure and dull back pain; and leaking of amniotic fluid. This information is not intended to replace advice  given to you by your health care provider. Make sure you discuss any questions you have with your health care provider. Document Released: 01/25/2001 Document Revised: 07/09/2015 Document Reviewed: 04/03/2012 Elsevier Interactive Patient Education  2017 [employer]  

## 2016-08-02 NOTE — Progress Notes (Signed)
   PRENATAL VISIT NOTE  Subjective:  Megan Schneider is a 26 y.o. G1P0 at [redacted]w[redacted]d being seen today for ongoing prenatal care.  She is currently monitored for the following issues for this low-risk pregnancy and has Supervision of normal pregnancy, antepartum and Sebaceous cyst on her problem list.  Patient reports no complaints.  Contractions: Not present. Vag. Bleeding: None.  Movement: Present. Denies leaking of fluid.   The following portions of the patient's history were reviewed and updated as appropriate: allergies, current medications, past family history, past medical history, past social history, past surgical history and problem list. Problem list updated.  Objective:   Vitals:   08/02/16 0838  BP: 133/76  Pulse: 91  Weight: 206 lb 8 oz (93.7 kg)    Fetal Status: Fetal Heart Rate (bpm): 135 Fundal Height: 33 cm Movement: Present     General:  Alert, oriented and cooperative. Patient is in no acute distress.  Skin: Skin is warm and dry. No rash noted.   Cardiovascular: Normal heart rate noted  Respiratory: Normal respiratory effort, no problems with respiration noted  Abdomen: Soft, gravid, appropriate for gestational age. Pain/Pressure: Absent     Pelvic:  Cervical exam deferred        Extremities: Normal range of motion.  Edema: Trace  Mental Status: Normal mood and affect. Normal behavior. Normal judgment and thought content.   Assessment and Plan:  Pregnancy: G1P0 at [redacted]w[redacted]d  1. Supervision of normal first pregnancy, antepartum   Preterm labor symptoms and general obstetric precautions including but not limited to vaginal bleeding, contractions, leaking of fluid and fetal movement were reviewed in detail with the patient. Please refer to After Visit Summary for other counseling recommendations.  Return in about 2 weeks (around 08/16/2016).   Emeterio Reeve, MD

## 2016-08-16 ENCOUNTER — Ambulatory Visit (INDEPENDENT_AMBULATORY_CARE_PROVIDER_SITE_OTHER): Payer: Medicaid Other | Admitting: Obstetrics and Gynecology

## 2016-08-16 VITALS — BP 122/79 | HR 98 | Wt 208.0 lb

## 2016-08-16 DIAGNOSIS — Z3400 Encounter for supervision of normal first pregnancy, unspecified trimester: Secondary | ICD-10-CM

## 2016-08-16 DIAGNOSIS — Z3403 Encounter for supervision of normal first pregnancy, third trimester: Secondary | ICD-10-CM

## 2016-08-16 NOTE — Progress Notes (Signed)
Subjective:  Megan Schneider is a 26 y.o. G1P0 at [redacted]w[redacted]d being seen today for ongoing prenatal care.  She is currently monitored for the following issues for this low-risk pregnancy and has Supervision of normal pregnancy, antepartum on her problem list.  Patient reports no complaints.  Contractions: Not present. Vag. Bleeding: None.  Movement: Present. Denies leaking of fluid.   The following portions of the patient's history were reviewed and updated as appropriate: allergies, current medications, past family history, past medical history, past social history, past surgical history and problem list. Problem list updated.  Objective:   Vitals:   08/16/16 1549  BP: 122/79  Pulse: 98  Weight: 208 lb (94.3 kg)    Fetal Status: Fetal Heart Rate (bpm): 150   Movement: Present     General:  Alert, oriented and cooperative. Patient is in no acute distress.  Skin: Skin is warm and dry. No rash noted.   Cardiovascular: Normal heart rate noted  Respiratory: Normal respiratory effort, no problems with respiration noted  Abdomen: Soft, gravid, appropriate for gestational age. Pain/Pressure: Absent     Pelvic:  Cervical exam deferred        Extremities: Normal range of motion.  Edema: None  Mental Status: Normal mood and affect. Normal behavior. Normal judgment and thought content.   Urinalysis:      Assessment and Plan:  Pregnancy: G1P0 at [redacted]w[redacted]d  1. Supervision of normal first pregnancy, antepartum Stable  Preterm labor symptoms and general obstetric precautions including but not limited to vaginal bleeding, contractions, leaking of fluid and fetal movement were reviewed in detail with the patient. Please refer to After Visit Summary for other counseling recommendations.  Return in about 1 week (around 08/23/2016) for OB visit.   Chancy Milroy, MD

## 2016-08-24 ENCOUNTER — Other Ambulatory Visit (HOSPITAL_COMMUNITY)
Admission: RE | Admit: 2016-08-24 | Discharge: 2016-08-24 | Disposition: A | Discharge status: Home / Self Care | Payer: Medicaid Other | Source: Ambulatory Visit | Attending: Obstetrics & Gynecology | Admitting: Obstetrics & Gynecology

## 2016-08-24 ENCOUNTER — Ambulatory Visit (INDEPENDENT_AMBULATORY_CARE_PROVIDER_SITE_OTHER): Payer: Medicaid Other | Admitting: Obstetrics & Gynecology

## 2016-08-24 VITALS — BP 129/78 | HR 87 | Wt 210.0 lb

## 2016-08-24 DIAGNOSIS — Z3400 Encounter for supervision of normal first pregnancy, unspecified trimester: Secondary | ICD-10-CM

## 2016-08-24 DIAGNOSIS — Z3403 Encounter for supervision of normal first pregnancy, third trimester: Secondary | ICD-10-CM | POA: Diagnosis not present

## 2016-08-24 DIAGNOSIS — F1290 Cannabis use, unspecified, uncomplicated: Secondary | ICD-10-CM

## 2016-08-24 HISTORY — DX: Cannabis use, unspecified, uncomplicated: F12.90

## 2016-08-24 LAB — OB RESULTS CONSOLE GBS: STREP GROUP B AG: POSITIVE

## 2016-08-24 NOTE — Progress Notes (Signed)
   PRENATAL VISIT NOTE  Subjective:  Megan Schneider is a 26 y.o. G1P0 at [redacted]w[redacted]d being seen today for ongoing prenatal care.  She is currently monitored for the following issues for this low-risk pregnancy and has Supervision of normal pregnancy, antepartum and Marijuana use on her problem list.  Patient reports no complaints.  Contractions: Not present. Vag. Bleeding: None.  Movement: Present. Denies leaking of fluid.   The following portions of the patient's history were reviewed and updated as appropriate: allergies, current medications, past family history, past medical history, past social history, past surgical history and problem list. Problem list updated.  Objective:   Vitals:   08/24/16 1008  BP: 129/78  Pulse: 87  Weight: 95.3 kg (210 lb)    Fetal Status: Fetal Heart Rate (bpm): 128   Movement: Present     General:  Alert, oriented and cooperative. Patient is in no acute distress.  Skin: Skin is warm and dry. No rash noted.   Cardiovascular: Normal heart rate noted  Respiratory: Normal respiratory effort, no problems with respiration noted  Abdomen: Soft, gravid, appropriate for gestational age. Pain/Pressure: Absent     Pelvic:  Cervical exam deferred        Extremities: Normal range of motion.  Edema: None  Mental Status: Normal mood and affect. Normal behavior. Normal judgment and thought content.   Assessment and Plan:  Pregnancy: G1P0 at [redacted]w[redacted]d  1. Supervision of normal first pregnancy, antepartum  - Strep Gp B NAA - Cervicovaginal ancillary only  2. Marijuana use tox screen pos in Jan and strong smell of marijuana in exam room  Preterm labor symptoms and general obstetric precautions including but not limited to vaginal bleeding, contractions, leaking of fluid and fetal movement were reviewed in detail with the patient. Please refer to After Visit Summary for other counseling recommendations.  Return in about 1 week (around 08/31/2016).   Emeterio Reeve,  MD

## 2016-08-24 NOTE — Patient Instructions (Signed)
Vaginal Delivery Vaginal delivery means that you will give birth by pushing your baby out of your birth canal (vagina). A team of health care providers will help you before, during, and after vaginal delivery. Birth experiences are unique for every woman and every pregnancy, and birth experiences vary depending on where you choose to give birth. What should I do to prepare for my baby's birth? Before your baby is born, it is important to talk with your health care provider about:  Your labor and delivery preferences. These may include: ? Medicines that you may be given. ? How you will manage your pain. This might include non-medical pain relief techniques or injectable pain relief such as epidural analgesia. ? How you and your baby will be monitored during labor and delivery. ? Who may be in the labor and delivery room with you. ? Your feelings about surgical delivery of your baby (cesarean delivery, or C-section) if this becomes necessary. ? Your feelings about receiving donated blood through an IV tube (blood transfusion) if this becomes necessary.  Whether you are able: ? To take pictures or videos of the birth. ? To eat during labor and delivery. ? To move around, walk, or change positions during labor and delivery.  What to expect after your baby is born, such as: ? Whether delayed umbilical cord clamping and cutting is offered. ? Who will care for your baby right after birth. ? Medicines or tests that may be recommended for your baby. ? Whether breastfeeding is supported in your hospital or birth center. ? How long you will be in the hospital or birth center.  How any medical conditions you have may affect your baby or your labor and delivery experience.  To prepare for your baby's birth, you should also:  Attend all of your health care visits before delivery (prenatal visits) as recommended by your health care provider. This is important.  Prepare your home for your baby's  arrival. Make sure that you have: ? Diapers. ? Baby clothing. ? Feeding equipment. ? Safe sleeping arrangements for you and your baby.  Install a car seat in your vehicle. Have your car seat checked by a certified car seat installer to make sure that it is installed safely.  Think about who will help you with your new baby at home for at least the first several weeks after delivery.  What can I expect when I arrive at the birth center or hospital? Once you are in labor and have been admitted into the hospital or birth center, your health care provider may:  Review your pregnancy history and any concerns you have.  Insert an IV tube into one of your veins. This is used to give you fluids and medicines.  Check your blood pressure, pulse, temperature, and heart rate (vital signs).  Check whether your bag of water (amniotic sac) has broken (ruptured).  Talk with you about your birth plan and discuss pain control options.  Monitoring Your health care provider may monitor your contractions (uterine monitoring) and your baby's heart rate (fetal monitoring). You may need to be monitored:  Often, but not continuously (intermittently).  All the time or for long periods at a time (continuously). Continuous monitoring may be needed if: ? You are taking certain medicines, such as medicine to relieve pain or make your contractions stronger. ? You have pregnancy or labor complications.  Monitoring may be done by:  Placing a special stethoscope or a handheld monitoring device on your abdomen to   check your baby's heartbeat, and feeling your abdomen for contractions. This method of monitoring does not continuously record your baby's heartbeat or your contractions.  Placing monitors on your abdomen (external monitors) to record your baby's heartbeat and the frequency and length of contractions. You may not have to wear external monitors all the time.  Placing monitors inside of your uterus  (internal monitors) to record your baby's heartbeat and the frequency, length, and strength of your contractions. ? Your health care provider may use internal monitors if he or she needs more information about the strength of your contractions or your baby's heart rate. ? Internal monitors are put in place by passing a thin, flexible wire through your vagina and into your uterus. Depending on the type of monitor, it may remain in your uterus or on your baby's head until birth. ? Your health care provider will discuss the benefits and risks of internal monitoring with you and will ask for your permission before inserting the monitors.  Telemetry. This is a type of continuous monitoring that can be done with external or internal monitors. Instead of having to stay in bed, you are able to move around during telemetry. Ask your health care provider if telemetry is an option for you.  Physical exam Your health care provider may perform a physical exam. This may include:  Checking whether your baby is positioned: ? With the head toward your vagina (head-down). This is most common. ? With the head toward the top of your uterus (head-up or breech). If your baby is in a breech position, your health care provider may try to turn your baby to a head-down position so you can deliver vaginally. If it does not seem that your baby can be born vaginally, your provider may recommend surgery to deliver your baby. In rare cases, you may be able to deliver vaginally if your baby is head-up (breech delivery). ? Lying sideways (transverse). Babies that are lying sideways cannot be delivered vaginally.  Checking your cervix to determine: ? Whether it is thinning out (effacing). ? Whether it is opening up (dilating). ? How low your baby has moved into your birth canal.  What are the three stages of labor and delivery?  Normal labor and delivery is divided into the following three stages: Stage 1  Stage 1 is the  longest stage of labor, and it can last for hours or days. Stage 1 includes: ? Early labor. This is when contractions may be irregular, or regular and mild. Generally, early labor contractions are more than 10 minutes apart. ? Active labor. This is when contractions get longer, more regular, more frequent, and more intense. ? The transition phase. This is when contractions happen very close together, are very intense, and may last longer than during any other part of labor.  Contractions generally feel mild, infrequent, and irregular at first. They get stronger, more frequent (about every 2-3 minutes), and more regular as you progress from early labor through active labor and transition.  Many women progress through stage 1 naturally, but you may need help to continue making progress. If this happens, your health care provider may talk with you about: ? Rupturing your amniotic sac if it has not ruptured yet. ? Giving you medicine to help make your contractions stronger and more frequent.  Stage 1 ends when your cervix is completely dilated to 4 inches (10 cm) and completely effaced. This happens at the end of the transition phase. Stage 2  Once   your cervix is completely effaced and dilated to 4 inches (10 cm), you may start to feel an urge to push. It is common for the body to naturally take a rest before feeling the urge to push, especially if you received an epidural or certain other pain medicines. This rest period may last for up to 1-2 hours, depending on your unique labor experience.  During stage 2, contractions are generally less painful, because pushing helps relieve contraction pain. Instead of contraction pain, you may feel stretching and burning pain, especially when the widest part of your baby's head passes through the vaginal opening (crowning).  Your health care provider will closely monitor your pushing progress and your baby's progress through the vagina during stage 2.  Your  health care provider may massage the area of skin between your vaginal opening and anus (perineum) or apply warm compresses to your perineum. This helps it stretch as the baby's head starts to crown, which can help prevent perineal tearing. ? In some cases, an incision may be made in your perineum (episiotomy) to allow the baby to pass through the vaginal opening. An episiotomy helps to make the opening of the vagina larger to allow more room for the baby to fit through.  It is very important to breathe and focus so your health care provider can control the delivery of your baby's head. Your health care provider may have you decrease the intensity of your pushing, to help prevent perineal tearing.  After delivery of your baby's head, the shoulders and the rest of the body generally deliver very quickly and without difficulty.  Once your baby is delivered, the umbilical cord may be cut right away, or this may be delayed for 1-2 minutes, depending on your baby's health. This may vary among health care providers, hospitals, and birth centers.  If you and your baby are healthy enough, your baby may be placed on your chest or abdomen to help maintain the baby's temperature and to help you bond with each other. Some mothers and babies start breastfeeding at this time. Your health care team will dry your baby and help keep your baby warm during this time.  Your baby may need immediate care if he or she: ? Showed signs of distress during labor. ? Has a medical condition. ? Was born too early (prematurely). ? Had a bowel movement before birth (meconium). ? Shows signs of difficulty transitioning from being inside the uterus to being outside of the uterus. If you are planning to breastfeed, your health care team will help you begin a feeding. Stage 3  The third stage of labor starts immediately after the birth of your baby and ends after you deliver the placenta. The placenta is an organ that develops  during pregnancy to provide oxygen and nutrients to your baby in the womb.  Delivering the placenta may require some pushing, and you may have mild contractions. Breastfeeding can stimulate contractions to help you deliver the placenta.  After the placenta is delivered, your uterus should tighten (contract) and become firm. This helps to stop bleeding in your uterus. To help your uterus contract and to control bleeding, your health care provider may: ? Give you medicine by injection, through an IV tube, by mouth, or through your rectum (rectally). ? Massage your abdomen or perform a vaginal exam to remove any blood clots that are left in your uterus. ? Empty your bladder by placing a thin, flexible tube (catheter) into your bladder. ? Encourage   you to breastfeed your baby. After labor is over, you and your baby will be monitored closely to ensure that you are both healthy until you are ready to go home. Your health care team will teach you how to care for yourself and your baby. This information is not intended to replace advice given to you by your health care provider. Make sure you discuss any questions you have with your health care provider. Document Released: 11/10/2007 Document Revised: 08/21/2015 Document Reviewed: 02/15/2015 Elsevier Interactive Patient Education  2018 [employer]  

## 2016-08-25 LAB — CERVICOVAGINAL ANCILLARY ONLY
Bacterial vaginitis: NEGATIVE
CANDIDA VAGINITIS: POSITIVE — AB
CHLAMYDIA, DNA PROBE: NEGATIVE
Neisseria Gonorrhea: NEGATIVE
Trichomonas: NEGATIVE

## 2016-08-26 LAB — STREP GP B NAA: Strep Gp B NAA: POSITIVE — AB

## 2016-08-29 ENCOUNTER — Ambulatory Visit (INDEPENDENT_AMBULATORY_CARE_PROVIDER_SITE_OTHER): Payer: Medicaid Other | Admitting: Certified Nurse Midwife

## 2016-08-29 VITALS — BP 132/84 | HR 103 | Wt 209.8 lb

## 2016-08-29 DIAGNOSIS — Z3400 Encounter for supervision of normal first pregnancy, unspecified trimester: Secondary | ICD-10-CM

## 2016-08-29 DIAGNOSIS — Z3403 Encounter for supervision of normal first pregnancy, third trimester: Secondary | ICD-10-CM

## 2016-08-29 DIAGNOSIS — R21 Rash and other nonspecific skin eruption: Secondary | ICD-10-CM

## 2016-08-29 MED ORDER — CLOTRIMAZOLE 1 % EX CREA: 1.0000 "application " | TOPICAL_CREAM | Freq: Two times a day (BID) | CUTANEOUS | 0 refills | Status: DC

## 2016-08-29 NOTE — Progress Notes (Signed)
Subjective:  Megan Schneider is a 26 y.o. G1P0 at [redacted]w[redacted]d being seen today for ongoing prenatal care.  She is currently monitored for the following issues for this low-risk pregnancy and has Supervision of normal pregnancy, antepartum and Marijuana use on her problem list.  Patient reports vaginal rash.. Rash started 1.5 weeks ago on labia minora and spread downward near anus, and itchy. No new soaps, detergents, or skin products. Hasn't shaved the area. Contractions: Not present. Vag. Bleeding: None.  Movement: Present. Denies leaking of fluid.   The following portions of the patient's history were reviewed and updated as appropriate: allergies, current medications, past family history, past medical history, past social history, past surgical history and problem list. Problem list updated.  Objective:   Vitals:   08/29/16 1349  BP: 132/84  Pulse: (!) 103  Weight: 209 lb 12.8 oz (95.2 kg)    Fetal Status: Fetal Heart Rate (bpm): 140 Fundal Height: 37 cm Movement: Present  Presentation: Vertex  General:  Alert, oriented and cooperative. Patient is in no acute distress.  Skin: Skin is warm and dry. No rash noted.   Cardiovascular: Normal heart rate noted  Respiratory: Normal respiratory effort, no problems with respiration noted  Abdomen: Soft, gravid, appropriate for gestational age. Pain/Pressure: Absent     Pelvic: Vag. Bleeding: None    Diffuse, raised, pink, scaly rash from labia minor extending to perineum and gluteal fold Cervical exam deferred        Extremities: Normal range of motion.  Edema: None  Mental Status: Normal mood and affect. Normal behavior. Normal judgment and thought content.   Urinalysis:      Assessment and Plan:  Pregnancy: G1P0 at [redacted]w[redacted]d  1. Supervision of normal first pregnancy, antepartum  2. Vulvar yeast infection - Rx Clotrimazole bid x7-10 days  Term labor symptoms and general obstetric precautions including but not limited to vaginal bleeding,  contractions, leaking of fluid and fetal movement were reviewed in detail with the patient. Please refer to After Visit Summary for other counseling recommendations.  Return in about 1 week (around 09/05/2016).   Julianne Handler, CNM

## 2016-08-29 NOTE — Progress Notes (Signed)
Patient reports good fetal movement, complains of itchy rash between her legs that has been there a little over a week, and is spreading. Positive candida vaginitis on 08-24-16.

## 2016-08-31 ENCOUNTER — Encounter: Payer: Medicaid Other | Admitting: Obstetrics & Gynecology

## 2016-09-07 ENCOUNTER — Ambulatory Visit (INDEPENDENT_AMBULATORY_CARE_PROVIDER_SITE_OTHER): Payer: Medicaid Other | Admitting: Obstetrics and Gynecology

## 2016-09-07 DIAGNOSIS — Z3400 Encounter for supervision of normal first pregnancy, unspecified trimester: Secondary | ICD-10-CM

## 2016-09-07 DIAGNOSIS — O99820 Streptococcus B carrier state complicating pregnancy: Secondary | ICD-10-CM | POA: Insufficient documentation

## 2016-09-07 DIAGNOSIS — Z3403 Encounter for supervision of normal first pregnancy, third trimester: Secondary | ICD-10-CM

## 2016-09-07 NOTE — Progress Notes (Signed)
Subjective:  Megan Schneider is a 26 y.o. G1P0 at [redacted]w[redacted]d being seen today for ongoing prenatal care.  She is currently monitored for the following issues for this low-risk pregnancy and has Supervision of normal pregnancy, antepartum; Marijuana use; and GBS (group B Streptococcus carrier), +RV culture, currently pregnant on her problem list.  Patient reports no complaints.  Contractions: Not present. Vag. Bleeding: None.  Movement: Present. Denies leaking of fluid.   The following portions of the patient's history were reviewed and updated as appropriate: allergies, current medications, past family history, past medical history, past social history, past surgical history and problem list. Problem list updated.  Objective:   Vitals:   09/07/16 1016  BP: 114/70  Pulse: 77  Weight: 214 lb (97.1 kg)    Fetal Status:     Movement: Present     General:  Alert, oriented and cooperative. Patient is in no acute distress.  Skin: Skin is warm and dry. No rash noted.   Cardiovascular: Normal heart rate noted  Respiratory: Normal respiratory effort, no problems with respiration noted  Abdomen: Soft, gravid, appropriate for gestational age. Pain/Pressure: Absent     Pelvic:  Cervical exam deferred        Extremities: Normal range of motion.  Edema: None  Mental Status: Normal mood and affect. Normal behavior. Normal judgment and thought content.   Urinalysis:      Assessment and Plan:  Pregnancy: G1P0 at [redacted]w[redacted]d  1. Supervision of normal first pregnancy, antepartum Labor precautions  2. GBS (group B Streptococcus carrier), +RV culture, currently pregnant Tx while in labor  Term labor symptoms and general obstetric precautions including but not limited to vaginal bleeding, contractions, leaking of fluid and fetal movement were reviewed in detail with the patient. Please refer to After Visit Summary for other counseling recommendations.  Return in about 1 week (around 09/14/2016) for OB  visit.   Chancy Milroy, MD

## 2016-09-07 NOTE — Patient Instructions (Signed)
Vaginal Delivery Vaginal delivery means that you will give birth by pushing your baby out of your birth canal (vagina). A team of health care providers will help you before, during, and after vaginal delivery. Birth experiences are unique for every woman and every pregnancy, and birth experiences vary depending on where you choose to give birth. What should I do to prepare for my baby's birth? Before your baby is born, it is important to talk with your health care provider about:  Your labor and delivery preferences. These may include: ? Medicines that you may be given. ? How you will manage your pain. This might include non-medical pain relief techniques or injectable pain relief such as epidural analgesia. ? How you and your baby will be monitored during labor and delivery. ? Who may be in the labor and delivery room with you. ? Your feelings about surgical delivery of your baby (cesarean delivery, or C-section) if this becomes necessary. ? Your feelings about receiving donated blood through an IV tube (blood transfusion) if this becomes necessary.  Whether you are able: ? To take pictures or videos of the birth. ? To eat during labor and delivery. ? To move around, walk, or change positions during labor and delivery.  What to expect after your baby is born, such as: ? Whether delayed umbilical cord clamping and cutting is offered. ? Who will care for your baby right after birth. ? Medicines or tests that may be recommended for your baby. ? Whether breastfeeding is supported in your hospital or birth center. ? How long you will be in the hospital or birth center.  How any medical conditions you have may affect your baby or your labor and delivery experience.  To prepare for your baby's birth, you should also:  Attend all of your health care visits before delivery (prenatal visits) as recommended by your health care provider. This is important.  Prepare your home for your baby's  arrival. Make sure that you have: ? Diapers. ? Baby clothing. ? Feeding equipment. ? Safe sleeping arrangements for you and your baby.  Install a car seat in your vehicle. Have your car seat checked by a certified car seat installer to make sure that it is installed safely.  Think about who will help you with your new baby at home for at least the first several weeks after delivery.  What can I expect when I arrive at the birth center or hospital? Once you are in labor and have been admitted into the hospital or birth center, your health care provider may:  Review your pregnancy history and any concerns you have.  Insert an IV tube into one of your veins. This is used to give you fluids and medicines.  Check your blood pressure, pulse, temperature, and heart rate (vital signs).  Check whether your bag of water (amniotic sac) has broken (ruptured).  Talk with you about your birth plan and discuss pain control options.  Monitoring Your health care provider may monitor your contractions (uterine monitoring) and your baby's heart rate (fetal monitoring). You may need to be monitored:  Often, but not continuously (intermittently).  All the time or for long periods at a time (continuously). Continuous monitoring may be needed if: ? You are taking certain medicines, such as medicine to relieve pain or make your contractions stronger. ? You have pregnancy or labor complications.  Monitoring may be done by:  Placing a special stethoscope or a handheld monitoring device on your abdomen to   check your baby's heartbeat, and feeling your abdomen for contractions. This method of monitoring does not continuously record your baby's heartbeat or your contractions.  Placing monitors on your abdomen (external monitors) to record your baby's heartbeat and the frequency and length of contractions. You may not have to wear external monitors all the time.  Placing monitors inside of your uterus  (internal monitors) to record your baby's heartbeat and the frequency, length, and strength of your contractions. ? Your health care provider may use internal monitors if he or she needs more information about the strength of your contractions or your baby's heart rate. ? Internal monitors are put in place by passing a thin, flexible wire through your vagina and into your uterus. Depending on the type of monitor, it may remain in your uterus or on your baby's head until birth. ? Your health care provider will discuss the benefits and risks of internal monitoring with you and will ask for your permission before inserting the monitors.  Telemetry. This is a type of continuous monitoring that can be done with external or internal monitors. Instead of having to stay in bed, you are able to move around during telemetry. Ask your health care provider if telemetry is an option for you.  Physical exam Your health care provider may perform a physical exam. This may include:  Checking whether your baby is positioned: ? With the head toward your vagina (head-down). This is most common. ? With the head toward the top of your uterus (head-up or breech). If your baby is in a breech position, your health care provider may try to turn your baby to a head-down position so you can deliver vaginally. If it does not seem that your baby can be born vaginally, your provider may recommend surgery to deliver your baby. In rare cases, you may be able to deliver vaginally if your baby is head-up (breech delivery). ? Lying sideways (transverse). Babies that are lying sideways cannot be delivered vaginally.  Checking your cervix to determine: ? Whether it is thinning out (effacing). ? Whether it is opening up (dilating). ? How low your baby has moved into your birth canal.  What are the three stages of labor and delivery?  Normal labor and delivery is divided into the following three stages: Stage 1  Stage 1 is the  longest stage of labor, and it can last for hours or days. Stage 1 includes: ? Early labor. This is when contractions may be irregular, or regular and mild. Generally, early labor contractions are more than 10 minutes apart. ? Active labor. This is when contractions get longer, more regular, more frequent, and more intense. ? The transition phase. This is when contractions happen very close together, are very intense, and may last longer than during any other part of labor.  Contractions generally feel mild, infrequent, and irregular at first. They get stronger, more frequent (about every 2-3 minutes), and more regular as you progress from early labor through active labor and transition.  Many women progress through stage 1 naturally, but you may need help to continue making progress. If this happens, your health care provider may talk with you about: ? Rupturing your amniotic sac if it has not ruptured yet. ? Giving you medicine to help make your contractions stronger and more frequent.  Stage 1 ends when your cervix is completely dilated to 4 inches (10 cm) and completely effaced. This happens at the end of the transition phase. Stage 2  Once   your cervix is completely effaced and dilated to 4 inches (10 cm), you may start to feel an urge to push. It is common for the body to naturally take a rest before feeling the urge to push, especially if you received an epidural or certain other pain medicines. This rest period may last for up to 1-2 hours, depending on your unique labor experience.  During stage 2, contractions are generally less painful, because pushing helps relieve contraction pain. Instead of contraction pain, you may feel stretching and burning pain, especially when the widest part of your baby's head passes through the vaginal opening (crowning).  Your health care provider will closely monitor your pushing progress and your baby's progress through the vagina during stage 2.  Your  health care provider may massage the area of skin between your vaginal opening and anus (perineum) or apply warm compresses to your perineum. This helps it stretch as the baby's head starts to crown, which can help prevent perineal tearing. ? In some cases, an incision may be made in your perineum (episiotomy) to allow the baby to pass through the vaginal opening. An episiotomy helps to make the opening of the vagina larger to allow more room for the baby to fit through.  It is very important to breathe and focus so your health care provider can control the delivery of your baby's head. Your health care provider may have you decrease the intensity of your pushing, to help prevent perineal tearing.  After delivery of your baby's head, the shoulders and the rest of the body generally deliver very quickly and without difficulty.  Once your baby is delivered, the umbilical cord may be cut right away, or this may be delayed for 1-2 minutes, depending on your baby's health. This may vary among health care providers, hospitals, and birth centers.  If you and your baby are healthy enough, your baby may be placed on your chest or abdomen to help maintain the baby's temperature and to help you bond with each other. Some mothers and babies start breastfeeding at this time. Your health care team will dry your baby and help keep your baby warm during this time.  Your baby may need immediate care if he or she: ? Showed signs of distress during labor. ? Has a medical condition. ? Was born too early (prematurely). ? Had a bowel movement before birth (meconium). ? Shows signs of difficulty transitioning from being inside the uterus to being outside of the uterus. If you are planning to breastfeed, your health care team will help you begin a feeding. Stage 3  The third stage of labor starts immediately after the birth of your baby and ends after you deliver the placenta. The placenta is an organ that develops  during pregnancy to provide oxygen and nutrients to your baby in the womb.  Delivering the placenta may require some pushing, and you may have mild contractions. Breastfeeding can stimulate contractions to help you deliver the placenta.  After the placenta is delivered, your uterus should tighten (contract) and become firm. This helps to stop bleeding in your uterus. To help your uterus contract and to control bleeding, your health care provider may: ? Give you medicine by injection, through an IV tube, by mouth, or through your rectum (rectally). ? Massage your abdomen or perform a vaginal exam to remove any blood clots that are left in your uterus. ? Empty your bladder by placing a thin, flexible tube (catheter) into your bladder. ? Encourage   you to breastfeed your baby. After labor is over, you and your baby will be monitored closely to ensure that you are both healthy until you are ready to go home. Your health care team will teach you how to care for yourself and your baby. This information is not intended to replace advice given to you by your health care provider. Make sure you discuss any questions you have with your health care provider. Document Released: 11/10/2007 Document Revised: 08/21/2015 Document Reviewed: 02/15/2015 Elsevier Interactive Patient Education  2018 [employer]  

## 2016-09-14 ENCOUNTER — Ambulatory Visit (INDEPENDENT_AMBULATORY_CARE_PROVIDER_SITE_OTHER): Payer: Medicaid Other | Admitting: Obstetrics & Gynecology

## 2016-09-14 ENCOUNTER — Telehealth (HOSPITAL_COMMUNITY): Payer: Self-pay | Admitting: *Deleted

## 2016-09-14 ENCOUNTER — Encounter (HOSPITAL_COMMUNITY): Payer: Self-pay | Admitting: *Deleted

## 2016-09-14 DIAGNOSIS — Z3403 Encounter for supervision of normal first pregnancy, third trimester: Secondary | ICD-10-CM

## 2016-09-14 DIAGNOSIS — Z3400 Encounter for supervision of normal first pregnancy, unspecified trimester: Secondary | ICD-10-CM

## 2016-09-14 NOTE — Patient Instructions (Signed)
Vaginal Delivery Vaginal delivery means that you will give birth by pushing your baby out of your birth canal (vagina). A team of health care providers will help you before, during, and after vaginal delivery. Birth experiences are unique for every woman and every pregnancy, and birth experiences vary depending on where you choose to give birth. What should I do to prepare for my baby's birth? Before your baby is born, it is important to talk with your health care provider about:  Your labor and delivery preferences. These may include: ? Medicines that you may be given. ? How you will manage your pain. This might include non-medical pain relief techniques or injectable pain relief such as epidural analgesia. ? How you and your baby will be monitored during labor and delivery. ? Who may be in the labor and delivery room with you. ? Your feelings about surgical delivery of your baby (cesarean delivery, or C-section) if this becomes necessary. ? Your feelings about receiving donated blood through an IV tube (blood transfusion) if this becomes necessary.  Whether you are able: ? To take pictures or videos of the birth. ? To eat during labor and delivery. ? To move around, walk, or change positions during labor and delivery.  What to expect after your baby is born, such as: ? Whether delayed umbilical cord clamping and cutting is offered. ? Who will care for your baby right after birth. ? Medicines or tests that may be recommended for your baby. ? Whether breastfeeding is supported in your hospital or birth center. ? How long you will be in the hospital or birth center.  How any medical conditions you have may affect your baby or your labor and delivery experience.  To prepare for your baby's birth, you should also:  Attend all of your health care visits before delivery (prenatal visits) as recommended by your health care provider. This is important.  Prepare your home for your baby's  arrival. Make sure that you have: ? Diapers. ? Baby clothing. ? Feeding equipment. ? Safe sleeping arrangements for you and your baby.  Install a car seat in your vehicle. Have your car seat checked by a certified car seat installer to make sure that it is installed safely.  Think about who will help you with your new baby at home for at least the first several weeks after delivery.  What can I expect when I arrive at the birth center or hospital? Once you are in labor and have been admitted into the hospital or birth center, your health care provider may:  Review your pregnancy history and any concerns you have.  Insert an IV tube into one of your veins. This is used to give you fluids and medicines.  Check your blood pressure, pulse, temperature, and heart rate (vital signs).  Check whether your bag of water (amniotic sac) has broken (ruptured).  Talk with you about your birth plan and discuss pain control options.  Monitoring Your health care provider may monitor your contractions (uterine monitoring) and your baby's heart rate (fetal monitoring). You may need to be monitored:  Often, but not continuously (intermittently).  All the time or for long periods at a time (continuously). Continuous monitoring may be needed if: ? You are taking certain medicines, such as medicine to relieve pain or make your contractions stronger. ? You have pregnancy or labor complications.  Monitoring may be done by:  Placing a special stethoscope or a handheld monitoring device on your abdomen to   check your baby's heartbeat, and feeling your abdomen for contractions. This method of monitoring does not continuously record your baby's heartbeat or your contractions.  Placing monitors on your abdomen (external monitors) to record your baby's heartbeat and the frequency and length of contractions. You may not have to wear external monitors all the time.  Placing monitors inside of your uterus  (internal monitors) to record your baby's heartbeat and the frequency, length, and strength of your contractions. ? Your health care provider may use internal monitors if he or she needs more information about the strength of your contractions or your baby's heart rate. ? Internal monitors are put in place by passing a thin, flexible wire through your vagina and into your uterus. Depending on the type of monitor, it may remain in your uterus or on your baby's head until birth. ? Your health care provider will discuss the benefits and risks of internal monitoring with you and will ask for your permission before inserting the monitors.  Telemetry. This is a type of continuous monitoring that can be done with external or internal monitors. Instead of having to stay in bed, you are able to move around during telemetry. Ask your health care provider if telemetry is an option for you.  Physical exam Your health care provider may perform a physical exam. This may include:  Checking whether your baby is positioned: ? With the head toward your vagina (head-down). This is most common. ? With the head toward the top of your uterus (head-up or breech). If your baby is in a breech position, your health care provider may try to turn your baby to a head-down position so you can deliver vaginally. If it does not seem that your baby can be born vaginally, your provider may recommend surgery to deliver your baby. In rare cases, you may be able to deliver vaginally if your baby is head-up (breech delivery). ? Lying sideways (transverse). Babies that are lying sideways cannot be delivered vaginally.  Checking your cervix to determine: ? Whether it is thinning out (effacing). ? Whether it is opening up (dilating). ? How low your baby has moved into your birth canal.  What are the three stages of labor and delivery?  Normal labor and delivery is divided into the following three stages: Stage 1  Stage 1 is the  longest stage of labor, and it can last for hours or days. Stage 1 includes: ? Early labor. This is when contractions may be irregular, or regular and mild. Generally, early labor contractions are more than 10 minutes apart. ? Active labor. This is when contractions get longer, more regular, more frequent, and more intense. ? The transition phase. This is when contractions happen very close together, are very intense, and may last longer than during any other part of labor.  Contractions generally feel mild, infrequent, and irregular at first. They get stronger, more frequent (about every 2-3 minutes), and more regular as you progress from early labor through active labor and transition.  Many women progress through stage 1 naturally, but you may need help to continue making progress. If this happens, your health care provider may talk with you about: ? Rupturing your amniotic sac if it has not ruptured yet. ? Giving you medicine to help make your contractions stronger and more frequent.  Stage 1 ends when your cervix is completely dilated to 4 inches (10 cm) and completely effaced. This happens at the end of the transition phase. Stage 2  Once   your cervix is completely effaced and dilated to 4 inches (10 cm), you may start to feel an urge to push. It is common for the body to naturally take a rest before feeling the urge to push, especially if you received an epidural or certain other pain medicines. This rest period may last for up to 1-2 hours, depending on your unique labor experience.  During stage 2, contractions are generally less painful, because pushing helps relieve contraction pain. Instead of contraction pain, you may feel stretching and burning pain, especially when the widest part of your baby's head passes through the vaginal opening (crowning).  Your health care provider will closely monitor your pushing progress and your baby's progress through the vagina during stage 2.  Your  health care provider may massage the area of skin between your vaginal opening and anus (perineum) or apply warm compresses to your perineum. This helps it stretch as the baby's head starts to crown, which can help prevent perineal tearing. ? In some cases, an incision may be made in your perineum (episiotomy) to allow the baby to pass through the vaginal opening. An episiotomy helps to make the opening of the vagina larger to allow more room for the baby to fit through.  It is very important to breathe and focus so your health care provider can control the delivery of your baby's head. Your health care provider may have you decrease the intensity of your pushing, to help prevent perineal tearing.  After delivery of your baby's head, the shoulders and the rest of the body generally deliver very quickly and without difficulty.  Once your baby is delivered, the umbilical cord may be cut right away, or this may be delayed for 1-2 minutes, depending on your baby's health. This may vary among health care providers, hospitals, and birth centers.  If you and your baby are healthy enough, your baby may be placed on your chest or abdomen to help maintain the baby's temperature and to help you bond with each other. Some mothers and babies start breastfeeding at this time. Your health care team will dry your baby and help keep your baby warm during this time.  Your baby may need immediate care if he or she: ? Showed signs of distress during labor. ? Has a medical condition. ? Was born too early (prematurely). ? Had a bowel movement before birth (meconium). ? Shows signs of difficulty transitioning from being inside the uterus to being outside of the uterus. If you are planning to breastfeed, your health care team will help you begin a feeding. Stage 3  The third stage of labor starts immediately after the birth of your baby and ends after you deliver the placenta. The placenta is an organ that develops  during pregnancy to provide oxygen and nutrients to your baby in the womb.  Delivering the placenta may require some pushing, and you may have mild contractions. Breastfeeding can stimulate contractions to help you deliver the placenta.  After the placenta is delivered, your uterus should tighten (contract) and become firm. This helps to stop bleeding in your uterus. To help your uterus contract and to control bleeding, your health care provider may: ? Give you medicine by injection, through an IV tube, by mouth, or through your rectum (rectally). ? Massage your abdomen or perform a vaginal exam to remove any blood clots that are left in your uterus. ? Empty your bladder by placing a thin, flexible tube (catheter) into your bladder. ? Encourage   you to breastfeed your baby. After labor is over, you and your baby will be monitored closely to ensure that you are both healthy until you are ready to go home. Your health care team will teach you how to care for yourself and your baby. This information is not intended to replace advice given to you by your health care provider. Make sure you discuss any questions you have with your health care provider. Document Released: 11/10/2007 Document Revised: 08/21/2015 Document Reviewed: 02/15/2015 Elsevier Interactive Patient Education  2018 [employer]  

## 2016-09-14 NOTE — Progress Notes (Signed)
   PRENATAL VISIT NOTE  Subjective:  Megan Schneider is a 26 y.o. G1P0 at [redacted]w[redacted]d being seen today for ongoing prenatal care.  She is currently monitored for the following issues for this low-risk pregnancy and has Supervision of normal pregnancy, antepartum; Marijuana use; and GBS (group B Streptococcus carrier), +RV culture, currently pregnant on her problem list.  Patient reports no complaints.  Contractions: Not present. Vag. Bleeding: None.  Movement: Present. Denies leaking of fluid.   The following portions of the patient's history were reviewed and updated as appropriate: allergies, current medications, past family history, past medical history, past social history, past surgical history and problem list. Problem list updated.  Objective:   Vitals:   09/14/16 0953  BP: 132/82  Pulse: 82  Weight: 213 lb 8 oz (96.8 kg)    Fetal Status: Fetal Heart Rate (bpm): 130   Movement: Present     General:  Alert, oriented and cooperative. Patient is in no acute distress.  Skin: Skin is warm and dry. No rash noted.   Cardiovascular: Normal heart rate noted  Respiratory: Normal respiratory effort, no problems with respiration noted  Abdomen: Soft, gravid, appropriate for gestational age.  Pain/Pressure: Absent     Pelvic: Cervical exam deferred        Extremities: Normal range of motion.  Edema: None  Mental Status:  Normal mood and affect. Normal behavior. Normal judgment and thought content.   Assessment and Plan:  Pregnancy: G1P0 at [redacted]w[redacted]d  1. Supervision of normal first pregnancy, antepartum Doing well  Term labor symptoms and general obstetric precautions including but not limited to vaginal bleeding, contractions, leaking of fluid and fetal movement were reviewed in detail with the patient. Please refer to After Visit Summary for other counseling recommendations.  Return in about 1 week (around 09/21/2016).   Emeterio Reeve, MD

## 2016-09-14 NOTE — Telephone Encounter (Signed)
Preadmission screen  

## 2016-09-19 ENCOUNTER — Ambulatory Visit (INDEPENDENT_AMBULATORY_CARE_PROVIDER_SITE_OTHER): Payer: Medicaid Other | Admitting: Certified Nurse Midwife

## 2016-09-19 VITALS — BP 129/83 | HR 98 | Wt 210.4 lb

## 2016-09-19 DIAGNOSIS — Z3403 Encounter for supervision of normal first pregnancy, third trimester: Secondary | ICD-10-CM

## 2016-09-19 DIAGNOSIS — O99820 Streptococcus B carrier state complicating pregnancy: Secondary | ICD-10-CM

## 2016-09-19 DIAGNOSIS — Z3400 Encounter for supervision of normal first pregnancy, unspecified trimester: Secondary | ICD-10-CM

## 2016-09-19 NOTE — Progress Notes (Signed)
Patient is ready to have her baby.

## 2016-09-19 NOTE — Progress Notes (Signed)
   PRENATAL VISIT NOTE  Subjective:  Megan Schneider is a 26 y.o. G1P0 at [redacted]w[redacted]d being seen today for ongoing prenatal care.  She is currently monitored for the following issues for this low-risk pregnancy and has Supervision of normal pregnancy, antepartum; Marijuana use; and GBS (group B Streptococcus carrier), +RV culture, currently pregnant on her problem list.  Patient reports no complaints.  Contractions: Not present. Vag. Bleeding: None.  Movement: Present. Denies leaking of fluid.   The following portions of the patient's history were reviewed and updated as appropriate: allergies, current medications, past family history, past medical history, past social history, past surgical history and problem list. Problem list updated.  Objective:   Vitals:   09/19/16 0809  BP: 129/83  Pulse: 98  Weight: 210 lb 6.4 oz (95.4 kg)    Fetal Status: Fetal Heart Rate (bpm): NST; reactive Fundal Height: 38 cm Movement: Present  Presentation: Vertex  General:  Alert, oriented and cooperative. Patient is in no acute distress.  Skin: Skin is warm and dry. No rash noted.   Cardiovascular: Normal heart rate noted  Respiratory: Normal respiratory effort, no problems with respiration noted  Abdomen: Soft, gravid, appropriate for gestational age.  Pain/Pressure: Absent     Pelvic: Cervical exam performed Dilation: 1.5 Effacement (%): 30 Station: -3  Extremities: Normal range of motion.  Edema: None  Mental Status:  Normal mood and affect. Normal behavior. Normal judgment and thought content.  NST: + accels, no decels, moderate variability, Cat. 1 tracing. No contractions on toco.   Assessment and Plan:  Pregnancy: G1P0 at 104w0d  1. Supervision of normal first pregnancy, antepartum     Reactive NST.  Doing well.  IOL scheduled   2. GBS (group B Streptococcus carrier), +RV culture, currently pregnant     PCN for labor/delivery  Term labor symptoms and general obstetric precautions including but  not limited to vaginal bleeding, contractions, leaking of fluid and fetal movement were reviewed in detail with the patient. Please refer to After Visit Summary for other counseling recommendations.  Return in about 4 weeks (around 10/17/2016) for Postpartum.   Morene Crocker, CNM

## 2016-09-20 ENCOUNTER — Encounter (HOSPITAL_COMMUNITY): Payer: Self-pay | Admitting: *Deleted

## 2016-09-20 ENCOUNTER — Inpatient Hospital Stay (HOSPITAL_COMMUNITY): Payer: Medicaid Other | Admitting: Anesthesiology

## 2016-09-20 ENCOUNTER — Inpatient Hospital Stay (HOSPITAL_COMMUNITY)
Admission: AD | Admit: 2016-09-20 | Discharge: 2016-09-22 | DRG: 775 | Disposition: A | Discharge status: Home / Self Care | Payer: Medicaid Other | Source: Ambulatory Visit | Attending: Family Medicine | Admitting: Family Medicine

## 2016-09-20 ENCOUNTER — Inpatient Hospital Stay (HOSPITAL_COMMUNITY)
Admission: AD | Admit: 2016-09-20 | Discharge: 2016-09-20 | Disposition: A | Discharge status: Home / Self Care | Payer: Medicaid Other | Source: Ambulatory Visit | Attending: Obstetrics & Gynecology | Admitting: Obstetrics & Gynecology

## 2016-09-20 DIAGNOSIS — R030 Elevated blood-pressure reading, without diagnosis of hypertension: Secondary | ICD-10-CM | POA: Diagnosis present

## 2016-09-20 DIAGNOSIS — M419 Scoliosis, unspecified: Secondary | ICD-10-CM | POA: Diagnosis present

## 2016-09-20 DIAGNOSIS — O99824 Streptococcus B carrier state complicating childbirth: Principal | ICD-10-CM | POA: Diagnosis present

## 2016-09-20 DIAGNOSIS — O479 False labor, unspecified: Secondary | ICD-10-CM

## 2016-09-20 DIAGNOSIS — Z3A40 40 weeks gestation of pregnancy: Secondary | ICD-10-CM

## 2016-09-20 DIAGNOSIS — O26893 Other specified pregnancy related conditions, third trimester: Secondary | ICD-10-CM | POA: Diagnosis present

## 2016-09-20 LAB — TYPE AND SCREEN
ABO/RH(D): O POS
Antibody Screen: NEGATIVE

## 2016-09-20 LAB — ABO/RH: ABO/RH(D): O POS

## 2016-09-20 LAB — COMPREHENSIVE METABOLIC PANEL
ALBUMIN: 3.4 g/dL — AB (ref 3.5–5.0)
ALK PHOS: 199 U/L — AB (ref 38–126)
ALT: 11 U/L — AB (ref 14–54)
AST: 20 U/L (ref 15–41)
Anion gap: 11 (ref 5–15)
BILIRUBIN TOTAL: 0.9 mg/dL (ref 0.3–1.2)
BUN: 7 mg/dL (ref 6–20)
CALCIUM: 9.5 mg/dL (ref 8.9–10.3)
CO2: 19 mmol/L — AB (ref 22–32)
CREATININE: 0.74 mg/dL (ref 0.44–1.00)
Chloride: 105 mmol/L (ref 101–111)
GFR calc non Af Amer: >60 mL/min (ref >60)
GLUCOSE: 112 mg/dL — AB (ref 65–99)
Potassium: 4.3 mmol/L (ref 3.5–5.1)
SODIUM: 135 mmol/L (ref 135–145)
TOTAL PROTEIN: 7.2 g/dL (ref 6.5–8.1)

## 2016-09-20 LAB — CBC
HEMATOCRIT: 38.9 % (ref 36.0–46.0)
HEMOGLOBIN: 13.8 g/dL (ref 12.0–15.0)
MCH: 30.9 pg (ref 26.0–34.0)
MCHC: 35.5 g/dL (ref 30.0–36.0)
MCV: 87 fL (ref 78.0–100.0)
PLATELETS: 152 10*3/uL (ref 150–400)
RBC: 4.47 MIL/uL (ref 3.87–5.11)
RDW: 13.9 % (ref 11.5–15.5)
WBC: 14.6 10*3/uL — ABNORMAL HIGH (ref 4.0–10.5)

## 2016-09-20 LAB — PROTEIN / CREATININE RATIO, URINE
CREATININE, URINE: 149 mg/dL
PROTEIN CREATININE RATIO: 0.26 mg/mg{creat} — AB (ref 0.00–0.15)
TOTAL PROTEIN, URINE: 38 mg/dL

## 2016-09-20 LAB — RPR: RPR Ser Ql: NONREACTIVE

## 2016-09-20 MED ORDER — LACTATED RINGERS IV SOLN
500.0000 mL | INTRAVENOUS | Status: DC | PRN
  Administered 2016-09-20: 500 mL via INTRAVENOUS

## 2016-09-20 MED ORDER — IBUPROFEN 600 MG PO TABS
600.0000 mg | ORAL_TABLET | Freq: Four times a day (QID) | ORAL | Status: DC
  Administered 2016-09-21 – 2016-09-22 (×7): 600 mg via ORAL
  Filled 2016-09-20 (×7): qty 1

## 2016-09-20 MED ORDER — OXYTOCIN BOLUS FROM INFUSION
500.0000 mL | Freq: Once | INTRAVENOUS | Status: AC
  Administered 2016-09-20: 500 mL via INTRAVENOUS

## 2016-09-20 MED ORDER — OXYCODONE-ACETAMINOPHEN 5-325 MG PO TABS
1.0000 | ORAL_TABLET | ORAL | Status: DC | PRN
Start: 2016-09-20 — End: 2016-09-21
  Filled 2016-09-20 (×2): qty 1

## 2016-09-20 MED ORDER — ACETAMINOPHEN 325 MG PO TABS: 650.0000 mg | ORAL_TABLET | ORAL | Status: DC | PRN

## 2016-09-20 MED ORDER — EPHEDRINE 5 MG/ML INJ
10.0000 mg | INTRAVENOUS | Status: DC | PRN
  Filled 2016-09-20: qty 2

## 2016-09-20 MED ORDER — DIPHENHYDRAMINE HCL 50 MG/ML IJ SOLN: 12.5000 mg | INTRAMUSCULAR | Status: DC | PRN

## 2016-09-20 MED ORDER — DEXTROSE 5 % IV SOLN
5.0000 10*6.[IU] | Freq: Once | INTRAVENOUS | Status: AC
  Administered 2016-09-20: 5 10*6.[IU] via INTRAVENOUS
  Filled 2016-09-20: qty 5

## 2016-09-20 MED ORDER — ONDANSETRON HCL 4 MG/2ML IJ SOLN: 4.0000 mg | Freq: Four times a day (QID) | INTRAMUSCULAR | Status: DC | PRN

## 2016-09-20 MED ORDER — SOD CITRATE-CITRIC ACID 500-334 MG/5ML PO SOLN: 30.0000 mL | ORAL | Status: DC | PRN

## 2016-09-20 MED ORDER — PHENYLEPHRINE 40 MCG/ML (10ML) SYRINGE FOR IV PUSH (FOR BLOOD PRESSURE SUPPORT)
80.0000 ug | PREFILLED_SYRINGE | INTRAVENOUS | Status: DC | PRN
  Filled 2016-09-20: qty 5

## 2016-09-20 MED ORDER — PHENYLEPHRINE 40 MCG/ML (10ML) SYRINGE FOR IV PUSH (FOR BLOOD PRESSURE SUPPORT)
80.0000 ug | PREFILLED_SYRINGE | INTRAVENOUS | Status: DC | PRN
  Filled 2016-09-20: qty 5
  Filled 2016-09-20: qty 10

## 2016-09-20 MED ORDER — LIDOCAINE HCL (PF) 1 % IJ SOLN
INTRAMUSCULAR | Status: DC | PRN
  Administered 2016-09-20: 6 mL via EPIDURAL
  Administered 2016-09-20: 4 mL

## 2016-09-20 MED ORDER — PENICILLIN G POT IN DEXTROSE 60000 UNIT/ML IV SOLN
3.0000 10*6.[IU] | INTRAVENOUS | Status: DC
  Administered 2016-09-20 (×3): 3 10*6.[IU] via INTRAVENOUS
  Filled 2016-09-20 (×10): qty 50

## 2016-09-20 MED ORDER — LIDOCAINE HCL (PF) 1 % IJ SOLN
30.0000 mL | INTRAMUSCULAR | Status: DC | PRN
  Administered 2016-09-20: 30 mL via SUBCUTANEOUS
  Filled 2016-09-20: qty 30

## 2016-09-20 MED ORDER — OXYCODONE-ACETAMINOPHEN 5-325 MG PO TABS: 2.0000 | ORAL_TABLET | ORAL | Status: DC | PRN

## 2016-09-20 MED ORDER — FENTANYL 2.5 MCG/ML BUPIVACAINE 1/10 % EPIDURAL INFUSION (WH - ANES)
14.0000 mL/h | INTRAMUSCULAR | Status: DC | PRN
  Administered 2016-09-20 (×2): 14 mL/h via EPIDURAL
  Filled 2016-09-20 (×2): qty 100

## 2016-09-20 MED ORDER — LACTATED RINGERS IV SOLN
INTRAVENOUS | Status: DC
  Administered 2016-09-20 (×3): via INTRAVENOUS

## 2016-09-20 MED ORDER — LACTATED RINGERS IV SOLN
500.0000 mL | Freq: Once | INTRAVENOUS | Status: AC
  Administered 2016-09-20: 1000 mL via INTRAVENOUS

## 2016-09-20 MED ORDER — OXYTOCIN 40 UNITS IN LACTATED RINGERS INFUSION - SIMPLE MED
2.5000 [IU]/h | INTRAVENOUS | Status: DC
  Administered 2016-09-20: 2.5 [IU]/h via INTRAVENOUS
  Filled 2016-09-20: qty 1000

## 2016-09-20 NOTE — Anesthesia Procedure Notes (Signed)
Epidural Patient location during procedure: OB  Staffing Anesthesiologist: Jazir Newey  Preanesthetic Checklist Completed: patient identified, pre-op evaluation, timeout performed, IV checked, risks and benefits discussed and monitors and equipment checked  Epidural Patient position: sitting Prep: DuraPrep Patient monitoring: blood pressure and continuous pulse ox Approach: right paramedian Location: L3-L4 Injection technique: LOR air  Needle:  Needle type: Tuohy  Needle gauge: 17 G Needle insertion depth: 7 cm Catheter type: closed end flexible Catheter size: 19 Gauge Catheter at skin depth: 14 cm Test dose: negative  Assessment Sensory level: T8  Additional Notes   Dosing of Epidural:  1st dose, through catheter .............................................  Xylocaine 40 mg  2nd dose, through catheter, after waiting 3 minutes.........Xylocaine 60 mg    As each dose occurred, patient was free of IV sx; and patient exhibited no evidence of SA injection.  Patient is more comfortable after epidural dosed. Please see RN's note for documentation of vital signs,and FHR which are stable.  Patient reminded not to try to ambulate with numb legs, and that an RN must be present when she attempts to get up.          

## 2016-09-20 NOTE — Progress Notes (Signed)
Patient ID: Megan Schneider, female   DOB: 26-Oct-1990, 26 y.o.   MRN: [id number] Labor Progress Note  S: Patient seen & examined for progress of labor. Patient comfortable with epidural.   O: BP 132/72   Pulse (!) 101   Temp 97.8 F (36.6 C) (Oral)   Resp 18   LMP 12/14/2015 (Exact Date)   SpO2 98%   FHT: 120bpm, mod var, +accels, variable decels TOCO: q2-19min, patient looks comfortable during contractions  CVE: Dilation: 9 Effacement (%): 100 Cervical Position: Anterior Station: -1 Presentation: Vertex Exam by:: Dr. Enid Derry  A&P: 26 y.o. G1P0000 [redacted]w[redacted]d SOL  Attempted AROM, making progress on her own Continue expectant management Anticipate SVD  Megan Charolett Yarrow, DO FM Resident PGY-1 09/20/2016 4:36 PM

## 2016-09-20 NOTE — Progress Notes (Signed)
LABOR PROGRESS NOTE  Megan Schneider is a 26 y.o. G1P0000 at [redacted]w[redacted]d  admitted for SOL.  Subjective: Received sign our from Dr. Si Raider and Dr. Enid Derry. Patient pushed for about 1 hour with minimal descent. Now being allowed to labor down.  Patient uncomfortable with contractions despite epidural.  Objective: BP 118/63   Pulse 78   Temp 99.4 F (37.4 C) (Oral)   Resp 20   LMP 12/14/2015 (Exact Date)   SpO2 99%  or  Vitals:   09/20/16 1820 09/20/16 1830 09/20/16 1900 09/20/16 1955  BP:  97/65 118/63   Pulse:  (!) 111 78   Resp:  18 20   Temp:    99.4 F (37.4 C)  TempSrc:    Oral  SpO2: 99%       Dilation: 10 Dilation Complete Date: 09/20/16 Dilation Complete Time: 1844 Effacement (%): 100 Cervical Position: Anterior Station: +2 (caput) Presentation: Vertex Exam by:: Curly [place], RN FHT: baseline rate 145, moderate varibility, +acel, variable decels Toco: ctx q2-3 min  Assessment / Plan: 26 y.o. G1P0000 at [redacted]w[redacted]d here for SOL.  Labor: Patient complete, but minimal descent with pushing. Laboring down. Will allow to labor down longer, and start pushing again in 30-60 min Fetal Wellbeing:  Cat II (moderate variability, but persistent variables Pain Control:  Epidural Anticipated MOD:  SVD  Gailen Shelter, MD 09/20/2016, 8:30 PM

## 2016-09-20 NOTE — MAU Note (Signed)
I have communicated with Dr.Cory (Resident)  and reviewed vital signs:  Vitals:   09/20/16 0206 09/20/16 0307  BP: 133/76 137/83  Pulse: (!) 120 (!) 116  Resp: 20 19  Temp: (!) 97.1 F (36.2 C) 98.9 F (37.2 C)    Vaginal exam:  Dilation: 2 Effacement (%): 50 Cervical Position: Posterior Station: -3 Presentation: Vertex Exam by:: Daevon Holdren fields rn ,   Also reviewed contraction pattern and that non-stress test is reactive.  It has been documented that patient is contracting every 2-3 minutes with no cervical change over 1.5 hours not indicating active labor.  Patient denies any other complaints.  Based on this report provider has given order for discharge.  A discharge order and diagnosis entered by a provider.   Labor discharge instructions reviewed with patient.      All Vitals were reviewed with provider; included review of maternal pulse, provider stated it was okay for patient to go home. Pt sent home with Labor Precautions.

## 2016-09-20 NOTE — Discharge Instructions (Signed)

## 2016-09-20 NOTE — Anesthesia Pain Management Evaluation Note (Signed)
  CRNA Pain Management Visit Note  Patient: Megan Schneider, 26 y.o., female  "Hello I am a member of the anesthesia team at [facility]. We have an anesthesia team available at all times to provide care throughout the hospital, including epidural management and anesthesia for C-section. I don't know your plan for the delivery whether it a natural birth, water birth, IV sedation, nitrous supplementation, doula or epidural, but we want to meet your pain goals."   1.Was your pain managed to your expectations on prior hospitalizations?   No prior hospitalizations  2.What is your expectation for pain management during this hospitalization?     Epidural  3.How can we help you reach that goal? epidural  Record the patient's initial score and the patient's pain goal.   Pain: 10  Pain Goal: 5 [facility] wants you to be able to say your pain was always managed very well.  [name] 09/20/2016

## 2016-09-20 NOTE — Progress Notes (Signed)
Nausea and mild pain. 9/90/0. Category 1 tracing. arom scant fluid. Expectant mgmt

## 2016-09-20 NOTE — Anesthesia Preprocedure Evaluation (Signed)
Anesthesia Evaluation  Patient identified by MRN, date of birth, ID band Patient awake    Reviewed: Allergy & Precautions, H&P , Patient's Chart, lab work & pertinent test results, reviewed documented beta blocker date and time   Airway Mallampati: II  TM Distance: >3 FB Neck ROM: full    Dental no notable dental hx.    Pulmonary    Pulmonary exam normal breath sounds clear to auscultation       Cardiovascular  Rhythm:regular Rate:Normal     Neuro/Psych    GI/Hepatic   Endo/Other    Renal/GU      Musculoskeletal   Abdominal   Peds  Hematology   Anesthesia Other Findings   Reproductive/Obstetrics                             Anesthesia Physical Anesthesia Plan  ASA: II  Anesthesia Plan: Epidural   Post-op Pain Management:    Induction:   PONV Risk Score and Plan:   Airway Management Planned:   Additional Equipment:   Intra-op Plan:   Post-operative Plan:   Informed Consent: I have reviewed the patients History and Physical, chart, labs and discussed the procedure including the risks, benefits and alternatives for the proposed anesthesia with the patient or authorized representative who has indicated his/her understanding and acceptance.   Dental Advisory Given  Plan Discussed with: CRNA and Surgeon  Anesthesia Plan Comments: (Labs checked- platelets confirmed with RN in room. Fetal heart tracing, per RN, reported to be stable enough for sitting procedure. Discussed epidural, and patient consents to the procedure:  included risk of possible headache,backache, failed block, allergic reaction, and nerve injury. This patient was asked if she had any questions or concerns before the procedure started.)        Anesthesia Quick Evaluation

## 2016-09-20 NOTE — MAU Note (Signed)
Patient taken directly from lobby to mau room.  C/o contractions 1-2 minutes Pain 10/10 Denies LOF or VB. +FM; FHR 130's. States was seen earlier at 2am and was 2cm/50%  SVE in mau 4-5cm/80% by Rush Farmer, RN. MD called about patient. Patient taken directly to L/D for admit .

## 2016-09-20 NOTE — Progress Notes (Signed)
Patient ID: Megan Schneider, female   DOB: 1990/07/28, 26 y.o.   MRN: [id number] Labor Progress Note  S: Patient examined for progress of labor. Patient comfortable with epidural.   O: BP (!) 126/92   Pulse (!) 102   Temp 98 F (36.7 C) (Oral)   Resp 20   LMP 12/14/2015 (Exact Date)   SpO2 98%   FHT: 120 bpm, mod var, +accels, no decels TOCO: q1-11min- hard to pick up on monitor- per patient, patient looks comfortable during contractions  CVE: Dilation: 7 Effacement (%): 100 Cervical Position: Anterior Station: 0 Presentation: Vertex Exam by:: Curly Sh[place], RN  A&P: 26 y.o. G1P0000 [redacted]w[redacted]d here for SOL  Pre- e labs wnl, Patient normotensive since epidural, PCR: .26 Continue expectant management Anticipate SVD  [place] Earnstine Meinders, DO FM Resident PGY-1 09/20/2016 12:18 PM

## 2016-09-20 NOTE — H&P (Signed)
LABOR ADMISSION HISTORY AND PHYSICAL  Saleema Weppler is a 26 y.o. female G1P0000 with IUP at [redacted]w[redacted]d by LMP presenting for SOL. She reports +FMs, No LOF, no VB, no blurry vision, no headaches, no peripheral edema, and no RUQ pain.  She plans on breast and bottle feeding. She is undecided for birth control.   Dating: By LMP --->  Estimated Date of Delivery: 09/19/16  Sono:   @[redacted]w[redacted]d , CWD, normal anatomy, cephalic presentation, 517G, 47% EFW  Prenatal History/Complications:  Past Medical History: Past Medical History:  Diagnosis Date  . Asthma    occasionally, exercise-induced; uses inhaler; not used "in a while"  . Scoliosis     Past Surgical History: Past Surgical History:  Procedure Laterality Date  . NO PAST SURGERIES      Obstetrical History: OB History    Gravida Para Term Preterm AB Living   1 0 0 0 0 0   SAB TAB Ectopic Multiple Live Births   0 0 0 0 0      Social History: Social History   Social History  . Marital status: Single    Spouse name: N/A  . Number of children: N/A  . Years of education: N/A   Social History Main Topics  . Smoking status: Never Smoker  . Smokeless tobacco: Never Used  . Alcohol use No  . Drug use: No  . Sexual activity: Yes    Birth control/ protection: None   Other Topics Concern  . None   Social History Narrative  . None    Family History: Family History  Problem Relation Age of Onset  . Diabetes Father     Allergies: No Known Allergies  Prescriptions Prior to Admission  Medication Sig Dispense Refill Last Dose  . clotrimazole (LOTRIMIN) 1 % cream Apply 1 application topically 2 (two) times daily. Use for 7-10 days (Patient not taking: Reported on 09/14/2016) 30 g 0 Not Taking  . Prenatal Multivit-Min-Fe-FA (PRENATAL VITAMINS) 0.8 MG tablet Take 1 tablet by mouth daily. 30 tablet 12 Past Week at Unknown time     Review of Systems   All systems reviewed and negative except as stated in HPI  Blood pressure (!)  152/81, pulse (!) 102, resp. rate 18, last menstrual period 12/14/2015. General appearance: alert, cooperative and no distress comfortable with epidural Lungs: normal work of breathing Extremities: Homans sign is negative, no sign of DVT Presentation: cephalic Fetal monitoringBaseline: 120 bpm, Variability: Good {> 6 bpm), Accelerations: Reactive and Decelerations: Absent Uterine activityFrequency: Every 2-4 minutes Dilation: 4.5 Effacement (%): 80 Station: 0 Exam by:: ginger morris rn   Prenatal labs: ABO, Rh: O/Positive/-- (01/24 1319) Antibody: Negative (01/24 1319) Rubella: Immune RPR: Non Reactive (05/16 1200)  HBsAg: Negative (01/24 1319)  HIV:   Non-reactive GBS: Positive (07/11 1206)  GTT: Fasting- 85, 1 hr- 113, 2 hr- 117  Prenatal Transfer Tool  Maternal Diabetes: No Genetic Screening: Normal Maternal Ultrasounds/Referrals: Normal Fetal Ultrasounds or other Referrals:  None Maternal Substance Abuse:  Yes:  Type: Marijuana Significant Maternal Medications:  None Significant Maternal Lab Results: Lab values include: Group B Strep positive  Results for orders placed or performed during the hospital encounter of 09/20/16 (from the past 24 hour(s))  CBC   Collection Time: 09/20/16  7:53 AM  Result Value Ref Range   WBC 14.6 (H) 4.0 - 10.5 K/uL   RBC 4.47 3.87 - 5.11 MIL/uL   Hemoglobin 13.8 12.0 - 15.0 g/dL   HCT 38.9 36.0 - 46.0 %  MCV 87.0 78.0 - 100.0 fL   MCH 30.9 26.0 - 34.0 pg   MCHC 35.5 30.0 - 36.0 g/dL   RDW 13.9 11.5 - 15.5 %   Platelets 152 150 - 400 K/uL    Patient Active Problem List   Diagnosis Date Noted  . Normal labor 09/20/2016  . GBS (group B Streptococcus carrier), +RV culture, currently pregnant 09/07/2016  . Marijuana use 08/24/2016  . Supervision of normal pregnancy, antepartum 03/08/2016    Assessment: Raileigh Sabater is a 26 y.o. G1P0000 at [redacted]w[redacted]d here for SOL.  #Labor: Expectant management #Pain: Epidural placed #FWB: Cat  1 #ID: GBS positive- being treated with PCN  #MOF: breast and bottle #MOC:Undecided- counseled on LARC's, OCP's #Circ: Outpatient  Patient has had some elevated blood pressures, will obtain urine PCR, pre-e labs. Not symptomatic.  [place] Izic Stfort, DO Family Medicine Resident PGY-1  09/20/2016, 8:56 AM

## 2016-09-20 NOTE — MAU Note (Signed)
Pt reports contractions since 9pm and now every 2-3 mins x2 hours. Pt denies LOF but reports bloody mucous. States she was 1cm yesterday in office. Reports good fetal movement.

## 2016-09-21 ENCOUNTER — Encounter (HOSPITAL_COMMUNITY): Payer: Self-pay

## 2016-09-21 MED ORDER — ZOLPIDEM TARTRATE 5 MG PO TABS: 5.0000 mg | ORAL_TABLET | Freq: Every evening | ORAL | Status: DC | PRN

## 2016-09-21 MED ORDER — TETANUS-DIPHTH-ACELL PERTUSSIS 5-2.5-18.5 LF-MCG/0.5 IM SUSP: 0.5000 mL | Freq: Once | INTRAMUSCULAR | Status: DC

## 2016-09-21 MED ORDER — BENZOCAINE-MENTHOL 20-0.5 % EX AERO
1.0000 "application " | INHALATION_SPRAY | CUTANEOUS | Status: DC | PRN
  Filled 2016-09-21: qty 56

## 2016-09-21 MED ORDER — DIPHENHYDRAMINE HCL 25 MG PO CAPS: 25.0000 mg | ORAL_CAPSULE | Freq: Four times a day (QID) | ORAL | Status: DC | PRN

## 2016-09-21 MED ORDER — OXYCODONE-ACETAMINOPHEN 5-325 MG PO TABS
1.0000 | ORAL_TABLET | ORAL | Status: DC | PRN
  Administered 2016-09-21 (×2): 1 via ORAL
  Filled 2016-09-21: qty 1

## 2016-09-21 MED ORDER — WITCH HAZEL-GLYCERIN EX PADS: 1.0000 "application " | MEDICATED_PAD | CUTANEOUS | Status: DC | PRN

## 2016-09-21 MED ORDER — ACETAMINOPHEN 325 MG PO TABS
650.0000 mg | ORAL_TABLET | ORAL | Status: DC | PRN
  Administered 2016-09-21: 650 mg via ORAL
  Filled 2016-09-21: qty 2

## 2016-09-21 MED ORDER — ONDANSETRON HCL 4 MG PO TABS: 4.0000 mg | ORAL_TABLET | ORAL | Status: DC | PRN

## 2016-09-21 MED ORDER — DIBUCAINE 1 % RE OINT
1.0000 | TOPICAL_OINTMENT | RECTAL | Status: DC | PRN
Start: 2016-09-21 — End: 2016-09-22

## 2016-09-21 MED ORDER — COCONUT OIL OIL: 1.0000 "application " | TOPICAL_OIL | Status: DC | PRN

## 2016-09-21 MED ORDER — SIMETHICONE 80 MG PO CHEW: 80.0000 mg | CHEWABLE_TABLET | ORAL | Status: DC | PRN

## 2016-09-21 MED ORDER — ONDANSETRON HCL 4 MG/2ML IJ SOLN: 4.0000 mg | INTRAMUSCULAR | Status: DC | PRN

## 2016-09-21 MED ORDER — SENNOSIDES-DOCUSATE SODIUM 8.6-50 MG PO TABS
2.0000 | ORAL_TABLET | ORAL | Status: DC
  Administered 2016-09-22: 2 via ORAL

## 2016-09-21 MED ORDER — PRENATAL MULTIVITAMIN CH
1.0000 | ORAL_TABLET | Freq: Every day | ORAL | Status: DC
  Administered 2016-09-21 – 2016-09-22 (×2): 1 via ORAL
  Filled 2016-09-21 (×2): qty 1

## 2016-09-21 NOTE — Lactation Note (Signed)
This note was copied from a baby's chart. Lactation Consultation Note  Patient Name: Megan Schneider Date: 09/21/2016 Reason for consult: Follow-up assessment  Baby is 33 hours old and has been to the breast and bottle in his life.  LC assisted and showed mom what depth at the breast should look like  And encouraged mom not to allow the baby to nibble onto the breast.  Swallows noted and increased with breast compressions, mom reported cramping  While the baby was feeding and was getting sleepy.  Since baby has had bottles , LC added a 79F SNS with the feeding and baby stayed in  A better pattern , released and was calm and satisfied.  LC encouraged and recommended to give the baby practice at the breast and to call  For assistance. Also mentioned RN's and LC's can assess latch. Mom aware of the importance of  Latch scores and feeding assessments  Mother informed of post-discharge support and given phone number to the lactation department, including services for phone call assistance; out-patient appointments; and breastfeeding support group. List of other breastfeeding resources in the community given in the handout. Encouraged mother to call for problems or concerns related to breastfeeding.   Maternal Data Has patient been taught Hand Expression?: Yes Does the patient have breastfeeding experience prior to this delivery?: No  Feeding Feeding Type: Formula Nipple Type: Slow - flow Length of feed: 7 min  LATCH Score Latch: Grasps breast easily, tongue down, lips flanged, rhythmical sucking.  Audible Swallowing: Spontaneous and intermittent  Type of Nipple: Everted at rest and after stimulation  Comfort (Breast/Nipple): Soft / non-tender  Hold (Positioning): Full assist, staff holds infant at breast  LATCH Score: 8  Interventions Interventions: Breast feeding basics reviewed;Assisted with latch;Skin to skin;Breast massage;Hand express;Breast compression;Adjust  position;Support pillows;Position options  Lactation Tools Discussed/Used Tools: 79F feeding tube / Syringe WIC Program: Yes   Consult Status Consult Status: Follow-up Date: 09/22/16 Follow-up type: In-patient    Megan Schneider 09/21/2016, 12:20 PM

## 2016-09-21 NOTE — Progress Notes (Signed)
Post Partum Day #1 Subjective: no complaints, up ad lib and tolerating PO; breast and bottlefeeding; undecided re contraception  Objective: Blood pressure 110/80, pulse 87, temperature 98.5 F (36.9 C), temperature source Oral, resp. rate 18, last menstrual period 12/14/2015, SpO2 99 %, unknown if currently breastfeeding.  Physical Exam:  General: alert, cooperative and no distress Lochia: appropriate Uterine Fundus: firm DVT Evaluation: No evidence of DVT seen on physical exam.   Recent Labs  09/20/16 0753  HGB 13.8  HCT 38.9    Assessment/Plan: Plan for discharge tomorrow  Elevated BP on admit- normotensive PP   LOS: 1 day   SHAW, KIMBERLY CNM 09/21/2016, 7:41 AM

## 2016-09-21 NOTE — Progress Notes (Signed)
CSW received consult for hx of marijuana use.  Referral was screened out due to the following: °~MOB had no documented substance use after initial prenatal visit/+UPT. °~MOB had no positive drug screens after initial prenatal visit/+UPT. °~Baby's UDS is negative. ° °Please consult CSW if current concerns arise or by MOB's request. ° °CSW will monitor CDS results and make report to Child Protective Services if warranted. ° °Emilynn Srinivasan Boyd-Gilyard, MSW, LCSW °Clinical Social Work °[phone] ° °

## 2016-09-21 NOTE — Anesthesia Postprocedure Evaluation (Signed)
Anesthesia Post Note  Patient: Megan Schneider  Procedure(s) Performed: * No procedures listed *     Patient location during evaluation: Mother Baby Anesthesia Type: Epidural Level of consciousness: awake and alert and oriented Pain management: pain level controlled Vital Signs Assessment: post-procedure vital signs reviewed and stable Respiratory status: spontaneous breathing and nonlabored ventilation Cardiovascular status: stable Postop Assessment: no headache, patient able to bend at knees, no backache, no signs of nausea or vomiting, epidural receding and adequate PO intake Anesthetic complications: no    Last Vitals:  Vitals:   09/21/16 0159 09/21/16 0601  BP: 128/64 110/80  Pulse: (!) 107 87  Resp: 18 18  Temp: 37.3 C 36.9 C    Last Pain:  Vitals:   09/21/16 0842  TempSrc:   PainSc: 1    Pain Goal: Patients Stated Pain Goal: 1 (09/21/16 0842)               Jabier Mutton

## 2016-09-22 MED ORDER — IBUPROFEN 600 MG PO TABS: 600.0000 mg | ORAL_TABLET | Freq: Four times a day (QID) | ORAL | 0 refills | Status: DC

## 2016-09-22 NOTE — Lactation Note (Signed)
This note was copied from a baby's chart. Lactation Consultation Note  Patient Name: Boy Leighana Neyman [id number] Date: 09/22/2016 Reason for consult: Follow-up assessment;Infant weight loss (1% weight loss )  Baby is 88 hours old,  And per mom has breast fed a few times and bottle fed all night.  LC reviewed supply and demand and the importance of giving the baby the opportunity  To breast feed. And offer the 2nd breast , if needing to supplement either relatch or supplement.  LC instructed mom on the use of hand pump. #27 flange provided for when the milk comes in if needed.  Sore nipple and engorgement prevention and tx reviewed.  Mom denies soreness.  Mother informed of post-discharge support and given phone number to the lactation department, including services for phone call assistance; out-patient appointments; and breastfeeding support group. List of other breastfeeding resources in the community given in the handout. Encouraged mother to call for problems or concerns related to breastfeeding.   Maternal Data    Feeding Feeding Type:  (per mom recently BF for short time and bottle fed ) Length of feed: 5 min  LATCH Score                   Interventions Interventions: Breast feeding basics reviewed  Lactation Tools Discussed/Used Tools: Pump;Flanges Flange Size: 27;24;Other (comment) (# 27 for when the milk comes in ) Breast pump type: Manual   Consult Status Consult Status: Complete Date: 09/22/16    Jerlyn Ly Dhanush Jokerst 09/22/2016, 12:10 PM

## 2016-09-22 NOTE — Discharge Summary (Signed)
OB Discharge Summary     Patient Name: Megan Schneider DOB: 1990/02/16 MRN: [id number]  Date of admission: 09/20/2016 Delivering MD: Gailen Shelter   Date of discharge: 09/22/2016  Admitting diagnosis: 40wks contraction 1-2 mins Intrauterine pregnancy: [redacted]w[redacted]d     Secondary diagnosis:  Active Problems:   Normal labor Elevated BP on admission     Discharge diagnosis: Term Pregnancy Delivered                                                                                                Post partum procedures:None  Augmentation: AROM  Complications: None  Hospital course:  Onset of Labor With Vaginal Delivery     26 y.o. yo G1P1001 at [redacted]w[redacted]d was admitted in Active Labor on 09/20/2016. She had some elevated BPs on admission with nl labs- not dx with gHTN as she became normotensive and asymptomatic for the remainder of her hospital stay. Patient had an uncomplicated labor course as follows:  Membrane Rupture Time/Date: 5:07 PM ,09/20/2016   Intrapartum Procedures: Episiotomy: None [1]                                         Lacerations:  2nd degree [3];Perineal [11]  Patient had a delivery of a Viable infant. 09/20/2016  Information for the patient's newborn:  Everline, Mahaffy [[id number]]  Delivery Method: Vaginal, Spontaneous Delivery (Filed from Delivery Summary)    Pateint had an uncomplicated postpartum course.  She is ambulating, tolerating a regular diet, passing flatus, and urinating well. Patient is discharged home in stable condition on 09/22/16.   Physical exam  Vitals:   09/21/16 0159 09/21/16 0601 09/21/16 1747 09/22/16 0559  BP: 128/64 110/80 118/65 118/66  Pulse: (!) 107 87 90 72  Resp: 18 18 18 16   Temp: 99.1 F (37.3 C) 98.5 F (36.9 C) 99.1 F (37.3 C) 97.9 F (36.6 C)  TempSrc: Oral Oral Oral Oral  SpO2:       General: alert, cooperative and no distress Lochia: appropriate Uterine Fundus: firm Incision: N/A DVT Evaluation: No evidence of DVT seen  on physical exam. Labs: Lab Results  Component Value Date   WBC 14.6 (H) 09/20/2016   HGB 13.8 09/20/2016   HCT 38.9 09/20/2016   MCV 87.0 09/20/2016   PLT 152 09/20/2016   CMP Latest Ref Rng & Units 09/20/2016  Glucose 65 - 99 mg/dL 112(H)  BUN 6 - 20 mg/dL 7  Creatinine 0.44 - 1.00 mg/dL 0.74  Sodium 135 - 145 mmol/L 135  Potassium 3.5 - 5.1 mmol/L 4.3  Chloride 101 - 111 mmol/L 105  CO2 22 - 32 mmol/L 19(L)  Calcium 8.9 - 10.3 mg/dL 9.5  Total Protein 6.5 - 8.1 g/dL 7.2  Total Bilirubin 0.3 - 1.2 mg/dL 0.9  Alkaline Phos 38 - 126 U/L 199(H)  AST 15 - 41 U/L 20  ALT 14 - 54 U/L 11(L)    Discharge instruction: per After Visit Summary and "Baby and Me Booklet".  After  visit meds:  Allergies as of 09/22/2016   No Known Allergies     Medication List    TAKE these medications   acetaminophen 500 MG tablet Commonly known as:  TYLENOL Take 1,000 mg by mouth every 6 (six) hours as needed for mild pain, moderate pain, fever or headache.   ibuprofen 600 MG tablet Commonly known as:  ADVIL,MOTRIN Take 1 tablet (600 mg total) by mouth every 6 (six) hours.   Prenatal Vitamins 0.8 MG tablet Take 1 tablet by mouth daily.       Diet: routine diet  Activity: Advance as tolerated. Pelvic rest for 6 weeks.   Outpatient follow up:4 weeks Follow up Appt:Future Appointments Date Time Provider [place]  10/24/2016 9:30 AM Morene Crocker, CNM [facility] None   Follow up Visit:No Follow-up on file.  Postpartum contraception: Undecided  Newborn Data: Live born female  Birth Weight: 6 lb 4.7 oz (2855 g) APGAR: 8, 9  Baby Feeding: Bottle Disposition:home with mother   09/22/2016 Windell Moment, MD   CNM attestation I have seen and examined this patient and agree with above documentation in the resident's note.   Jessicah Croll is a 26 y.o. G1P1001 s/p SVD.   Pain is well controlled.  Plan for birth control is undecided.  Method of Feeding: both  PE:  BP 118/66  (BP Location: Left Arm)   Pulse 72   Temp 97.9 F (36.6 C) (Oral)   Resp 16   LMP 12/14/2015 (Exact Date)   SpO2 99%   Breastfeeding? Unknown  Fundus firm   Recent Labs  09/20/16 0753  HGB 13.8  HCT 38.9     Plan: discharge today - postpartum care discussed - f/u clinic in 4 weeks for postpartum visit   Serita Grammes, CNM 9:04 AM 09/22/2016

## 2016-09-26 ENCOUNTER — Inpatient Hospital Stay (HOSPITAL_COMMUNITY): Admission: RE | Admit: 2016-09-26 | Payer: Medicaid Other | Source: Ambulatory Visit

## 2016-10-24 ENCOUNTER — Ambulatory Visit (INDEPENDENT_AMBULATORY_CARE_PROVIDER_SITE_OTHER): Payer: Medicaid Other | Admitting: Certified Nurse Midwife

## 2016-10-24 ENCOUNTER — Encounter: Payer: Self-pay | Admitting: Certified Nurse Midwife

## 2016-10-24 VITALS — BP 149/90 | HR 57 | Ht 67.0 in | Wt 189.0 lb

## 2016-10-24 DIAGNOSIS — Z30016 Encounter for initial prescription of transdermal patch hormonal contraceptive device: Secondary | ICD-10-CM | POA: Insufficient documentation

## 2016-10-24 DIAGNOSIS — Z1389 Encounter for screening for other disorder: Secondary | ICD-10-CM | POA: Diagnosis not present

## 2016-10-24 MED ORDER — NORELGESTROMIN-ETH ESTRADIOL 150-35 MCG/24HR TD PTWK: 1.0000 | MEDICATED_PATCH | TRANSDERMAL | 12 refills | Status: DC

## 2016-10-24 NOTE — Progress Notes (Signed)
..  Post Partum Exam  Megan Schneider is a 26 y.o. G72P1001 female who presents for a postpartum visit. She is 4 weeks postpartum following a spontaneous vaginal delivery. I have fully reviewed the prenatal and intrapartum course. The delivery was at 40.1 gestational weeks.  Anesthesia: epidural. Postpartum course has been good. Baby's course has been good. Baby is feeding by bottle - Similac Advance. Bleeding reports started period today. Bowel function is normal. Bladder function is normal. Patient is not sexually active. Contraception method is none. Postpartum depression screening:neg  The following portions of the patient's history were reviewed and updated as appropriate: allergies, current medications, past family history, past medical history, past social history, past surgical history and problem list.  Review of Systems Pertinent items are noted in HPI. Pertinent items noted in HPI and remainder of comprehensive ROS otherwise negative.    Objective:  unknown if currently breastfeeding.  General:  alert, cooperative and no distress   Breasts:  inspection negative, no nipple discharge or bleeding, no masses or nodularity palpable  Lungs: clear to auscultation bilaterally  Heart:  regular rate and rhythm, S1, S2 normal, no murmur, click, rub or gallop  Abdomen: soft, non-tender; bowel sounds normal; no masses,  no organomegaly  Pelvic Exam: Not performed.        Assessment:    normal 4 week postpartum exam. Pap smear not done at today's visit.  Pap smear 03/09/16: negative. Contraception management; rx patches  Plan:   1. Contraception: condoms and Ortho-Evra patches weekly 2. Reviewed ACHES.  Annual exam after the first of the year; late January. Return to work note completed.  3. Follow up in: 4 months or as needed.

## 2016-10-26 ENCOUNTER — Encounter: Payer: Self-pay | Admitting: *Deleted

## 2016-10-26 NOTE — Progress Notes (Signed)
Pt called to office stating that her employer needs letter to state that she may return to work with no restrictions. Previous letter did not state this. New letter composed to state no restriction and pt may begin work this week.

## 2017-05-13 ENCOUNTER — Other Ambulatory Visit: Payer: Self-pay

## 2017-05-13 ENCOUNTER — Encounter (HOSPITAL_COMMUNITY): Payer: Self-pay

## 2017-05-13 ENCOUNTER — Emergency Department (HOSPITAL_COMMUNITY): Payer: No Typology Code available for payment source

## 2017-05-13 ENCOUNTER — Emergency Department (HOSPITAL_COMMUNITY)
Admission: EM | Admit: 2017-05-13 | Discharge: 2017-05-13 | Disposition: A | Discharge status: Home / Self Care | Payer: No Typology Code available for payment source | Attending: Emergency Medicine | Admitting: Emergency Medicine

## 2017-05-13 DIAGNOSIS — Y92410 Unspecified street and highway as the place of occurrence of the external cause: Secondary | ICD-10-CM | POA: Insufficient documentation

## 2017-05-13 DIAGNOSIS — Z79899 Other long term (current) drug therapy: Secondary | ICD-10-CM | POA: Diagnosis not present

## 2017-05-13 DIAGNOSIS — Y998 Other external cause status: Secondary | ICD-10-CM | POA: Diagnosis not present

## 2017-05-13 DIAGNOSIS — M25571 Pain in right ankle and joints of right foot: Secondary | ICD-10-CM | POA: Diagnosis not present

## 2017-05-13 DIAGNOSIS — Y939 Activity, unspecified: Secondary | ICD-10-CM | POA: Insufficient documentation

## 2017-05-13 DIAGNOSIS — J45909 Unspecified asthma, uncomplicated: Secondary | ICD-10-CM | POA: Diagnosis not present

## 2017-05-13 MED ORDER — NAPROXEN 250 MG PO TABS
500.0000 mg | ORAL_TABLET | Freq: Once | ORAL | Status: AC
  Administered 2017-05-13: 500 mg via ORAL
  Filled 2017-05-13: qty 2

## 2017-05-13 MED ORDER — NAPROXEN 500 MG PO TABS: 500.0000 mg | ORAL_TABLET | Freq: Two times a day (BID) | ORAL | 0 refills | Status: DC
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
## 2018-02-14 NOTE — L&D Delivery Note (Addendum)
Called for delivery for Dr. Dione Plover. Pt found to be in hands and knees and involuntarily pushing.  Delivery Note At 2243 a viable female infant was delivered via SVD, presentation: OP. APGAR: 9, 9; weight pending.   Placenta status: spontaneously delivered intact with gentle cord traction. Fundus firm initially with massage and IM Pitocin although bleeding brisk, Cytotec 800 mcg po given. Fundus became firm but over several minutes was boggy again. Hemabate given and with fundal massage bleeding slowed and fundus firmed.   Anesthesia: local Lacerations: 1st degree perineal Suture used for repair: 3-0 Vicryl rapide Est. Blood Loss (mL): 564 Placenta to LD Complications none Cord ph n/a   Mom to postpartum. Baby to Couplet care / Skin to Skin.    Julianne Handler, CNM 01/26/2019 12:11 AM

## 2018-02-21 IMAGING — US US MFM OB COMP +14 WKS
1 series · 14 of 28 positions shown · non-contrast
Comparison: none

[Series 1: us mfm ob comp +14 wks · 63 acquisitions, 14 frames shown]
[im 3/63]
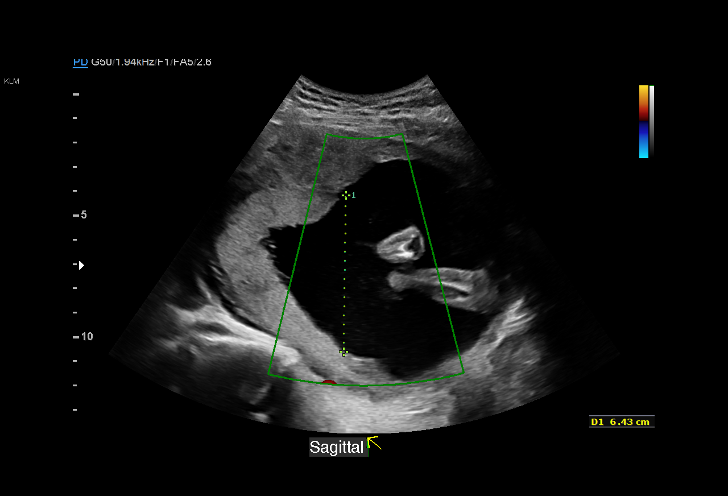
[im 7/63]
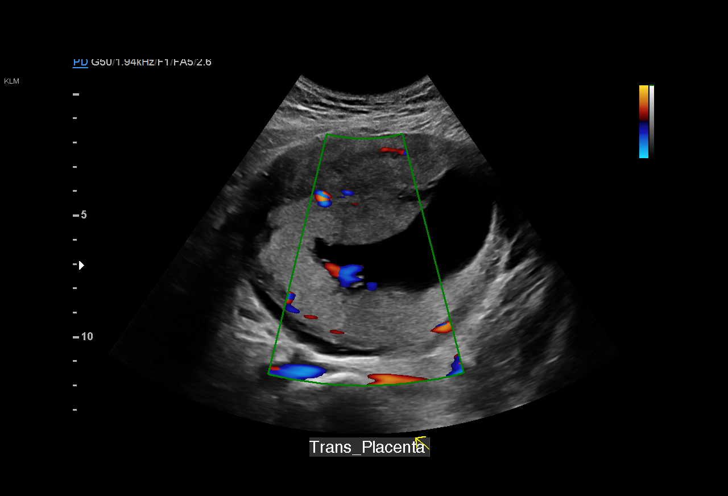
[im 12/63]
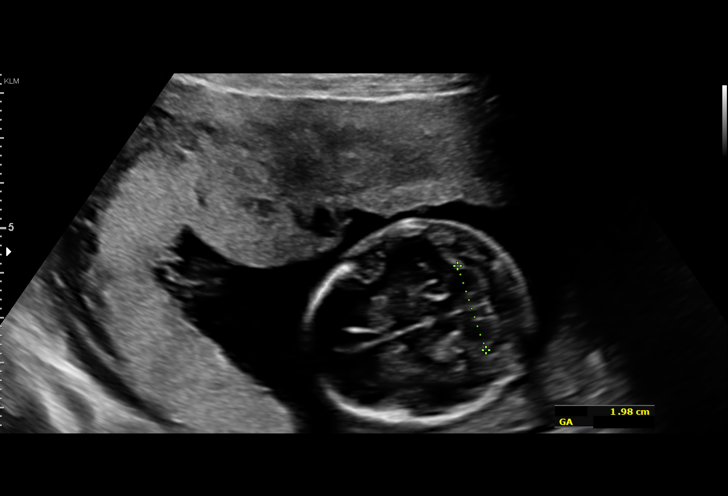
[im 17/63]
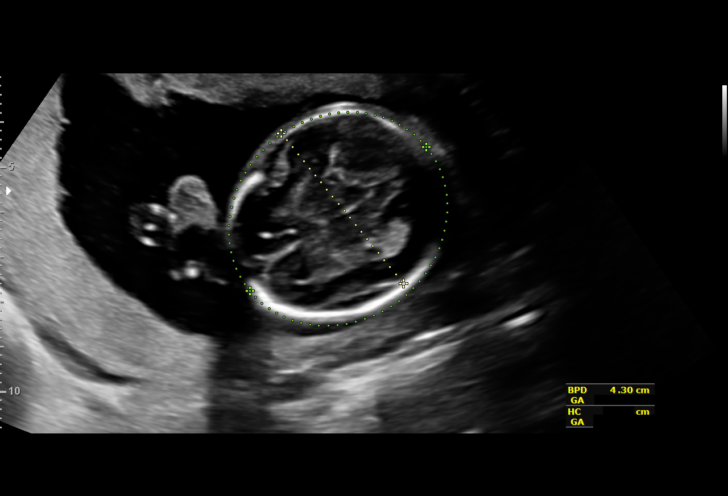
[im 21/63]
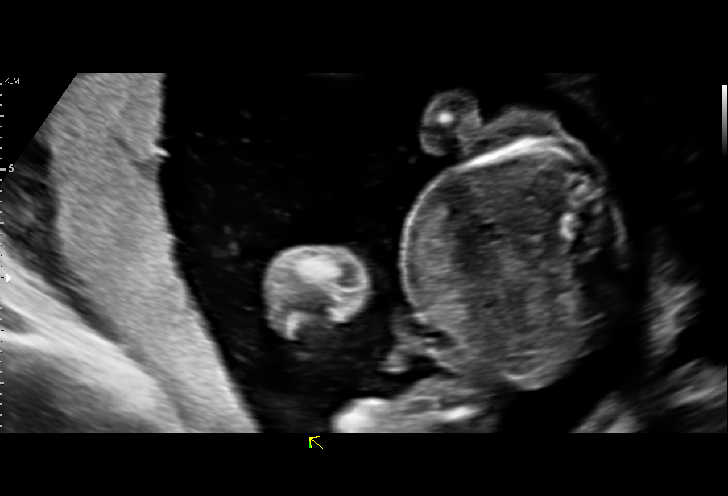
[im 26/63]
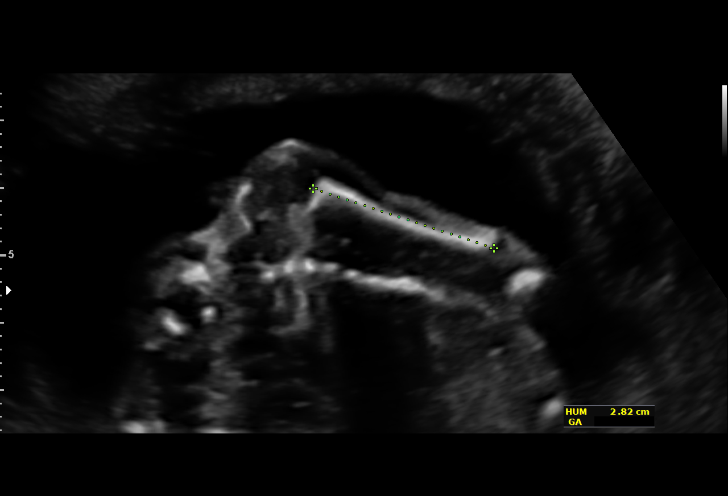
[im 30/63]
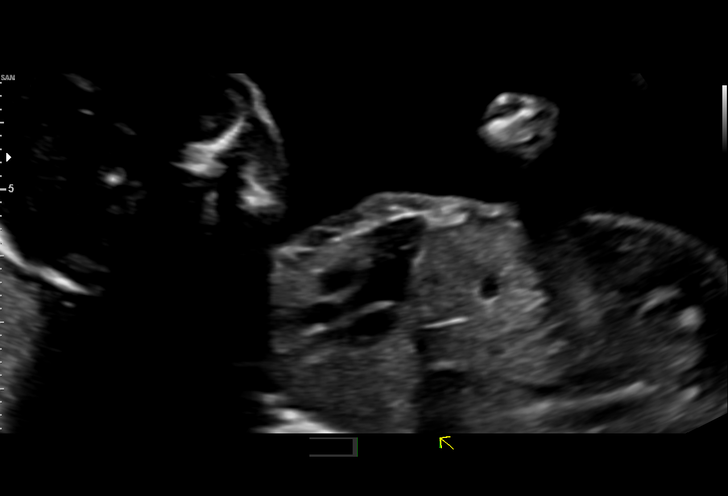
[im 35/63]
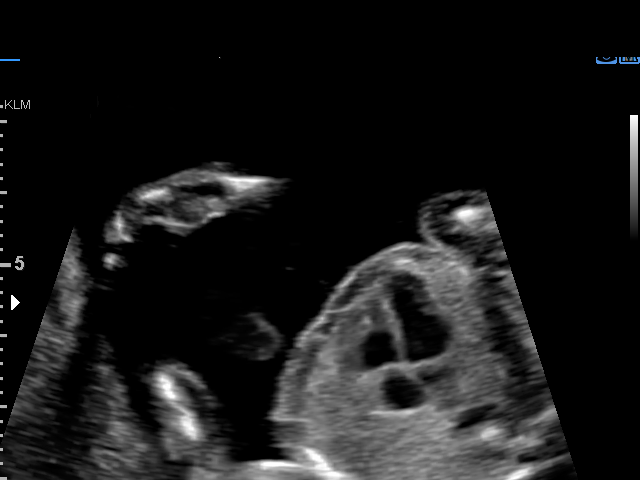
[im 40/63]
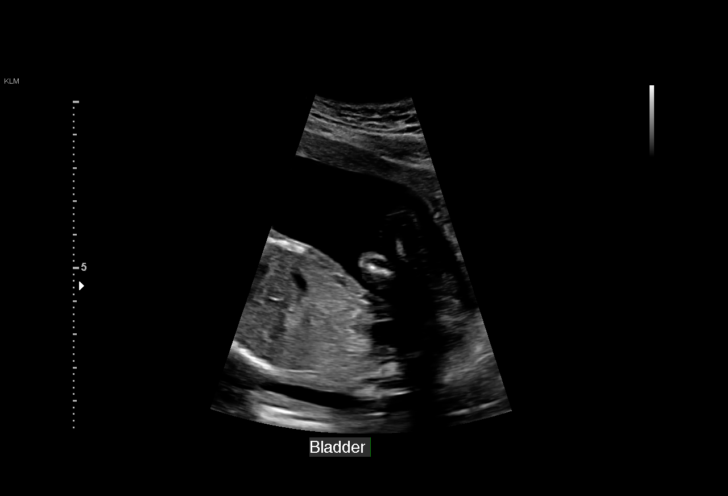
[im 44/63]
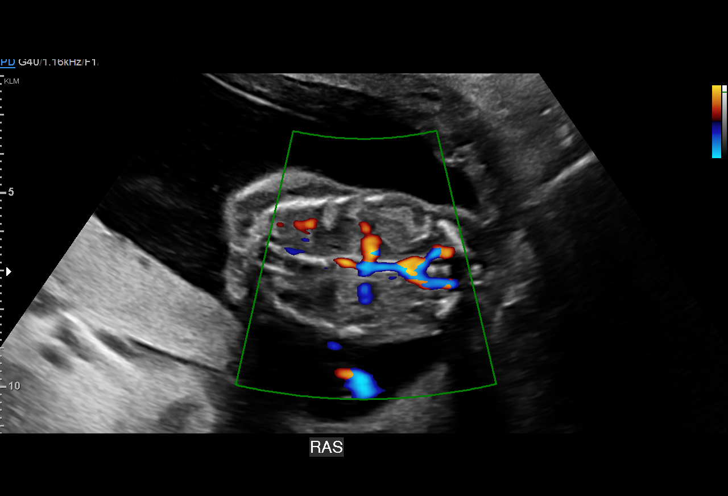
[im 49/63]
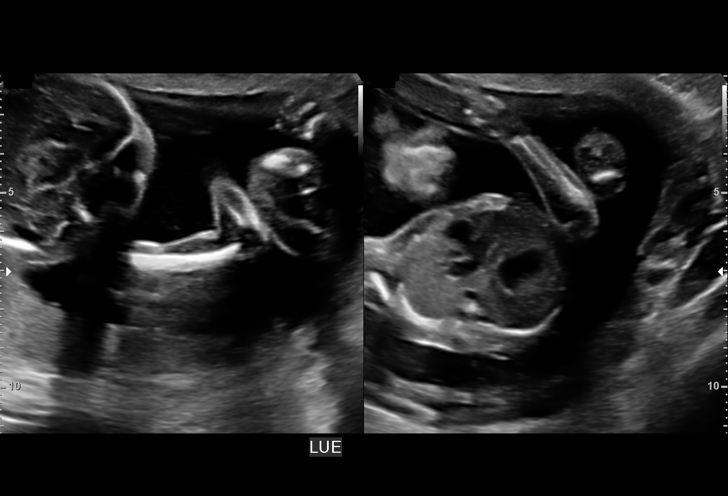
[im 53/63]
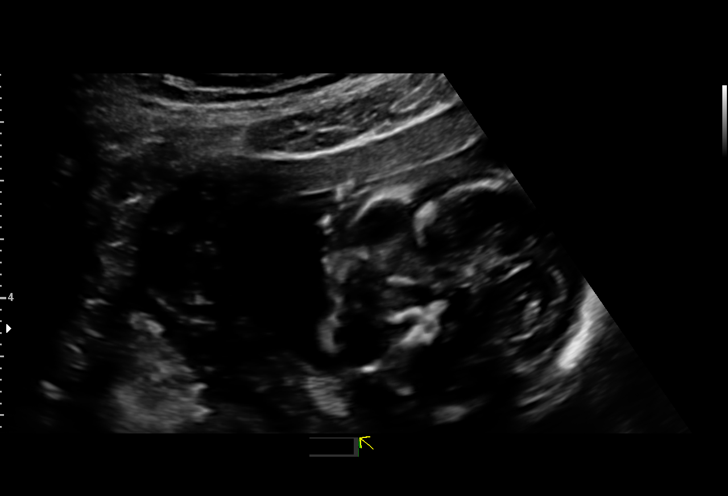
[im 58/63]
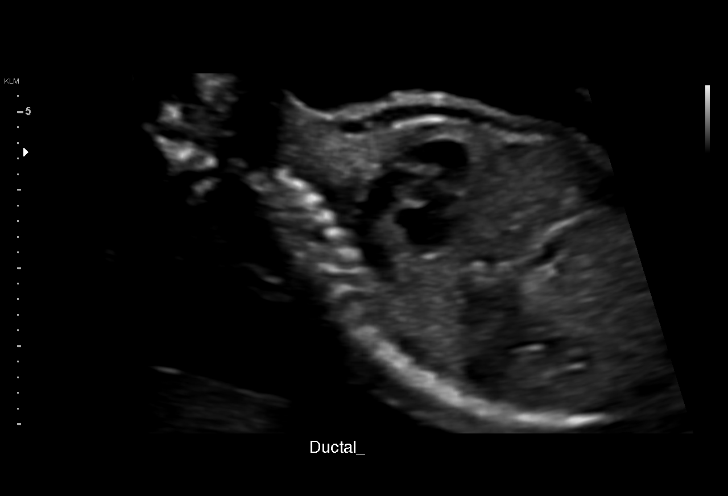
[im 63/63]
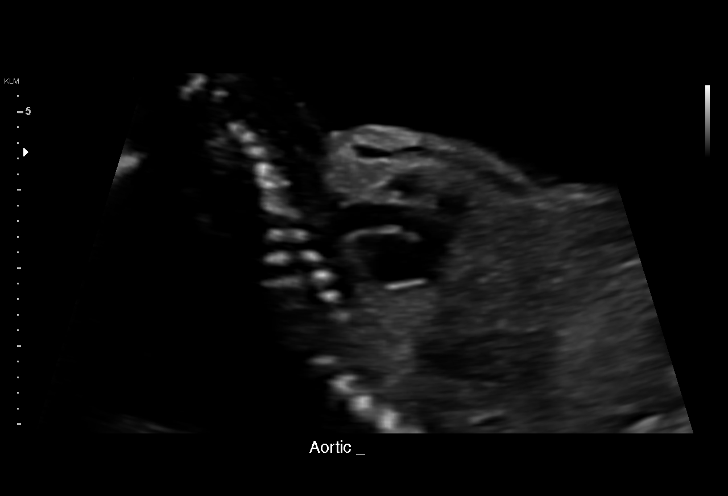

[14 of 28 positions shown; findings below may reference images not displayed]

for [REDACTED]care ([HOSPITAL])

1  JELANI ERIKA             [id number]      [id number]     [id number]
Indications

19 weeks gestation of pregnancy
Encounter for antenatal screening for
malformations
Asthma                                         MPP.ZP j69.363
OB History

Gravidity:    1         Term:   0        Prem:   0        SAB:   0
TOP:          0       Ectopic:  0        Living: 0
Fetal Evaluation

Num Of Fetuses:     1
Fetal Heart         136
Rate(bpm):
Cardiac Activity:   Observed
Presentation:       Breech
Placenta:           Posterior, above cervical os
P. Cord Insertion:  Visualized

Amniotic Fluid
AFI FV:      Subjectively within normal limits

Largest Pocket(cm)
6
Biometry
BPD:      43.1  mm     G. Age:  19w 0d         46  %    CI:        79.39   %   70 - 86
FL/HC:      18.2   %   16.1 -
HC:      152.9  mm     G. Age:  18w 2d         10  %    HC/AC:      1.06       1.09 -
AC:      144.4  mm     G. Age:  19w 5d         66  %    FL/BPD:     64.5   %
FL:       27.8  mm     G. Age:  18w 4d         22  %    FL/AC:      19.3   %   20 - 24
HUM:      27.8  mm     G. Age:  18w 6d         45  %
CER:      19.8  mm     G. Age:  18w 6d         45  %

Est. FW:     273  gm    0 lb 10 oz      44  %
Gestational Age

LMP:           19w 1d       Date:   12/14/15                 EDD:   09/19/16
U/S Today:     18w 6d                                        EDD:   09/21/16
Best:          19w 1d    Det. By:   LMP  (12/14/15)          EDD:   09/19/16
Anatomy

Cranium:               Appears normal         Aortic Arch:            Appears normal
Cavum:                 Appears normal         Ductal Arch:            Appears normal
Ventricles:            Appears normal         Diaphragm:              Appears normal
Choroid Plexus:        Appears normal         Stomach:                Appears normal, left
sided
Cerebellum:            Appears normal         Abdomen:                Appears normal
Posterior Fossa:       Appears normal         Abdominal Wall:         Appears nml (cord
insert, abd wall)
Nuchal Fold:           Appears normal         Cord Vessels:           Appears normal (3
vessel cord)
Face:                  Orbits nl; profile not Kidneys:                Appear normal
well visualized
Lips:                  Appears normal         Bladder:                Appears normal
Thoracic:              Appears normal         Spine:                  Not well visualized
Heart:                 Appears normal         Upper Extremities:      Appears normal
(4CH, axis, and situs
RVOT:                  Appears normal         Lower Extremities:      Appears normal
LVOT:                  Appears normal

Other:  Fetus appears to be a male. Heels visualized. Open hands visualized.
Technically difficult due to fetal position.
Cervix Uterus Adnexa

Cervix
Length:            3.6  cm.
Normal appearance by transabdominal scan.
Impression

Single IUP at 19w 1d
Limited views of the fetal spine obtained due to fetal position
The remainder of the fetal anatomy appears normal
Posterior placenta without previa
Ultrasound measurements are consistent with LMP
Normal amniotic fluid volume
Recommendations

Recommend follow-up ultrasound examination in 4 weeks to
reevaluate the fetal spine

## 2018-06-21 ENCOUNTER — Other Ambulatory Visit: Payer: Self-pay | Admitting: *Deleted

## 2018-06-21 NOTE — Progress Notes (Signed)
Spoke with pt regarding N&V. Pt states she has NOB appt in a couple weeks but she having severe N&V.  Pt states she is not able to keep much of anything down. Pt made aware that she may take Unisom and Vit B6 OTC due to current insurance coverage- no Rx could be sent in. Pt made aware that if she is not able to keep anything down, including fliuds, to be evaluated at [facility].  Pt states understanding.

## 2018-06-22 ENCOUNTER — Encounter: Payer: Self-pay | Admitting: Emergency Medicine

## 2018-06-22 ENCOUNTER — Inpatient Hospital Stay (HOSPITAL_COMMUNITY)
Admission: EM | Admit: 2018-06-22 | Discharge: 2018-06-22 | Disposition: A | Discharge status: Home / Self Care | Payer: PRIVATE HEALTH INSURANCE | Attending: Obstetrics & Gynecology | Admitting: Obstetrics & Gynecology

## 2018-06-22 ENCOUNTER — Other Ambulatory Visit: Payer: Self-pay

## 2018-06-22 DIAGNOSIS — O210 Mild hyperemesis gravidarum: Secondary | ICD-10-CM | POA: Diagnosis not present

## 2018-06-22 DIAGNOSIS — R112 Nausea with vomiting, unspecified: Secondary | ICD-10-CM | POA: Diagnosis present

## 2018-06-22 DIAGNOSIS — Z833 Family history of diabetes mellitus: Secondary | ICD-10-CM | POA: Diagnosis not present

## 2018-06-22 DIAGNOSIS — Z3A08 8 weeks gestation of pregnancy: Secondary | ICD-10-CM | POA: Insufficient documentation

## 2018-06-22 LAB — URINALYSIS, ROUTINE W REFLEX MICROSCOPIC
Bilirubin Urine: NEGATIVE
Glucose, UA: NEGATIVE mg/dL
Hgb urine dipstick: NEGATIVE
Ketones, ur: 20 mg/dL — AB
Leukocytes,Ua: NEGATIVE
Nitrite: NEGATIVE
Protein, ur: 30 mg/dL — AB
Specific Gravity, Urine: 1.029 (ref 1.005–1.030)
pH: 6 (ref 5.0–8.0)

## 2018-06-22 LAB — POCT PREGNANCY, URINE: Preg Test, Ur: POSITIVE — AB

## 2018-06-22 MED ORDER — PROMETHAZINE HCL 12.5 MG PO TABS
12.5000 mg | ORAL_TABLET | Freq: Four times a day (QID) | ORAL | 1 refills | Status: DC | PRN
Start: 2018-06-22 — End: 2018-11-05

## 2018-06-22 MED ORDER — GLYCOPYRROLATE 1 MG PO TABS
1.0000 mg | ORAL_TABLET | Freq: Once | ORAL | Status: AC
  Administered 2018-06-22: 1 mg via ORAL
  Filled 2018-06-22: qty 1

## 2018-06-22 MED ORDER — SODIUM CHLORIDE 0.9 % IV SOLN
25.0000 mg | Freq: Once | INTRAVENOUS | Status: AC
  Administered 2018-06-22: 25 mg via INTRAVENOUS
  Filled 2018-06-22: qty 1

## 2018-06-22 MED ORDER — GLYCOPYRROLATE 1 MG PO TABS
1.0000 mg | ORAL_TABLET | Freq: Three times a day (TID) | ORAL | 1 refills | Status: DC
Start: 2018-06-22 — End: 2018-07-13

## 2018-06-22 NOTE — Discharge Instructions (Signed)

## 2018-06-22 NOTE — MAU Note (Signed)
Presents with c/o N&V, denies diarhhea.  Reports emesis x5 in 24 hour period.  Denies VB or abdominal pain/cramping.

## 2018-06-22 NOTE — MAU Provider Note (Signed)
History     CSN: [id number]  Arrival date and time: 06/22/18 0830   First Provider Initiated Contact with Patient 06/22/18 [phone]      Chief Complaint  Patient presents with  . Emesis  . Nausea   HPI  Ms.  Megan Schneider is a 28 y.o. year old [id number] female at [redacted]w[redacted]d weeks gestation who presents to MAU reporting N/V and unable to keep anything down for several days. She has [facility], so was unable to get Diclegis Rx'd to her. She was told to take Unisom and Vitamin B6 for N/V. She states that she has nothing to throw up anymore, but she continues to spit. She last ate jello at 0730 this morning and drank Gatorade 30 mins before she arrived in MAU. She reports the jello did not stay down, but "so far the Gatorade has." She denies any abdominal pain, H/A, dizziness, blurry vision, SOB, CP, or numbness/tingling in extremities.   Past Medical History:  Diagnosis Date  . Asthma    occasionally, exercise-induced; uses inhaler; not used "in a while"  . Scoliosis     Past Surgical History:  Procedure Laterality Date  . NO PAST SURGERIES      Family History  Problem Relation Age of Onset  . Diabetes Father     Social History   Tobacco Use  . Smoking status: Never Smoker  . Smokeless tobacco: Never Used  Substance Use Topics  . Alcohol use: No    Alcohol/week: 0.0 standard drinks  . Drug use: No    Allergies: No Known Allergies  Medications Prior to Admission  Medication Sig Dispense Refill Last Dose  . Prenatal Multivit-Min-Fe-FA (PRENATAL VITAMINS) 0.8 MG tablet Take 1 tablet by mouth daily. 30 tablet 12 06/21/2018 at 0600  . acetaminophen (TYLENOL) 500 MG tablet Take 1,000 mg by mouth every 6 (six) hours as needed for mild pain, moderate pain, fever or headache.   Taking  . ibuprofen (ADVIL,MOTRIN) 600 MG tablet Take 1 tablet (600 mg total) by mouth every 6 (six) hours. 30 tablet 0 Taking  . naproxen (NAPROSYN) 500 MG tablet Take 1 tablet (500 mg total) by  mouth 2 (two) times daily. 30 tablet 0   . norelgestromin-ethinyl estradiol (ORTHO EVRA) 150-35 MCG/24HR transdermal patch Place 1 patch onto the skin once a week. 3 patch 12     Review of Systems  Constitutional: Negative.   HENT: Negative.   Eyes: Negative.   Respiratory: Negative.   Gastrointestinal: Positive for nausea and vomiting.       Lots of spitting  Endocrine: Negative.   Musculoskeletal: Negative.   Skin: Negative.   Allergic/Immunologic: Negative.   Neurological: Negative.   Hematological: Negative.   Psychiatric/Behavioral: Negative.    Physical Exam   Blood pressure 129/75, pulse 70, temperature 98.5 F (36.9 C), temperature source Oral, resp. rate 20, height 5\' 7"  (1.702 m), weight 73.6 kg, SpO2 100 %.  Physical Exam  Vitals reviewed. Constitutional: She is oriented to person, place, and time. She appears well-developed and well-nourished.  HENT:  Head: Normocephalic and atraumatic.  Eyes: Pupils are equal, round, and reactive to light.  Neck: Normal range of motion.  Cardiovascular: Normal rate.  Respiratory: Effort normal.  GI: Soft. Bowel sounds are decreased.  Genitourinary:    Genitourinary Comments: Pelvic deferred   Musculoskeletal: Normal range of motion.  Neurological: She is alert and oriented to person, place, and time. She has normal reflexes.  Skin: Skin is warm and dry.  Psychiatric: She has a normal mood and affect. Her behavior is normal. Judgment and thought content normal.    MAU Course  Procedures  MDM CCUA UPT IVFs: Phenergan 25 mg in LR 1000 ml @ 999 ml/hr -- resolved nausea/vomiting Robinul 1 mg -- spitting improved PO Challenge -- patient tolerated well    Results for orders placed or performed during the hospital encounter of 06/22/18 (from the past 24 hour(s))  Pregnancy, urine POC     Status: Abnormal   Collection Time: 06/22/18  9:15 AM  Result Value Ref Range   Preg Test, Ur POSITIVE (A) NEGATIVE  Urinalysis, Routine  w reflex microscopic     Status: Abnormal   Collection Time: 06/22/18  9:31 AM  Result Value Ref Range   Color, Urine YELLOW YELLOW   APPearance CLOUDY (A) CLEAR   Specific Gravity, Urine 1.029 1.005 - 1.030   pH 6.0 5.0 - 8.0   Glucose, UA NEGATIVE NEGATIVE mg/dL   Hgb urine dipstick NEGATIVE NEGATIVE   Bilirubin Urine NEGATIVE NEGATIVE   Ketones, ur 20 (A) NEGATIVE mg/dL   Protein, ur 30 (A) NEGATIVE mg/dL   Nitrite NEGATIVE NEGATIVE   Leukocytes,Ua NEGATIVE NEGATIVE   RBC / HPF 0-5 0 - 5 RBC/hpf   WBC, UA 0-5 0 - 5 WBC/hpf   Bacteria, UA FEW (A) NONE SEEN   Squamous Epithelial / LPF 21-50 0 - 5   Mucus PRESENT    Sperm, UA PRESENT     Assessment and Plan  Morning sickness - Plan: Discharge patient - Rx for Phenergan 12.5 mg every 6 hrs prn N/V - Rx for Robinul 1 mg TID - Advised to download GoodRx app to get discounted pricing on medications - Information provided on morning sickness and eating plan for pregnant women - Keep scheduled appt with [facility] on 07/05/2018 - Patient verbalized an understanding of the plan of care and agrees.    Laury Deep, MSN, CNM 06/22/2018, 9:39 AM

## 2018-07-04 ENCOUNTER — Encounter: Payer: Medicaid Other | Admitting: Nurse Practitioner

## 2018-07-05 ENCOUNTER — Other Ambulatory Visit: Payer: Self-pay

## 2018-07-05 ENCOUNTER — Other Ambulatory Visit (HOSPITAL_COMMUNITY)
Admission: RE | Admit: 2018-07-05 | Discharge: 2018-07-05 | Disposition: A | Discharge status: Home / Self Care | Payer: PRIVATE HEALTH INSURANCE | Source: Ambulatory Visit | Attending: Obstetrics and Gynecology | Admitting: Obstetrics and Gynecology

## 2018-07-05 ENCOUNTER — Encounter: Payer: Self-pay | Admitting: Obstetrics and Gynecology

## 2018-07-05 ENCOUNTER — Ambulatory Visit (INDEPENDENT_AMBULATORY_CARE_PROVIDER_SITE_OTHER): Payer: Medicaid Other | Admitting: Obstetrics and Gynecology

## 2018-07-05 VITALS — BP 137/80 | HR 87 | Temp 98.5°F | Wt 163.4 lb

## 2018-07-05 DIAGNOSIS — R7989 Other specified abnormal findings of blood chemistry: Secondary | ICD-10-CM

## 2018-07-05 DIAGNOSIS — Z3480 Encounter for supervision of other normal pregnancy, unspecified trimester: Secondary | ICD-10-CM

## 2018-07-05 DIAGNOSIS — O210 Mild hyperemesis gravidarum: Secondary | ICD-10-CM

## 2018-07-05 DIAGNOSIS — Z3490 Encounter for supervision of normal pregnancy, unspecified, unspecified trimester: Secondary | ICD-10-CM | POA: Insufficient documentation

## 2018-07-05 DIAGNOSIS — Z3481 Encounter for supervision of other normal pregnancy, first trimester: Secondary | ICD-10-CM | POA: Diagnosis not present

## 2018-07-05 DIAGNOSIS — Z3A10 10 weeks gestation of pregnancy: Secondary | ICD-10-CM

## 2018-07-05 MED ORDER — DOXYLAMINE-PYRIDOXINE 10-10 MG PO TBEC: 2.0000 | DELAYED_RELEASE_TABLET | Freq: Every day | ORAL | 5 refills | Status: DC

## 2018-07-05 MED ORDER — BLOOD PRESSURE KIT: PACK | 0 refills | Status: AC

## 2018-07-05 NOTE — Progress Notes (Signed)
INITIAL PRENATAL VISIT NOTE  Subjective:  Megan Schneider is a 28 y.o. G2P1001 at [redacted]w[redacted]d by LMP being seen today for her initial prenatal visit. This is an unplanned pregnancy. She and partner are happy with the pregnancy. She was using nothing for birth control previously. She has an obstetric history significant for SVD. She has a medical history significant for n/a.  Patient reports nausea.  Contractions: Not present. Vag. Bleeding: None.  Movement: Absent. Denies leaking of fluid.    Past Medical History:  Diagnosis Date  . Asthma    occasionally, exercise-induced; uses inhaler; not used "in a while"  . Scoliosis     Past Surgical History:  Procedure Laterality Date  . NO PAST SURGERIES      OB History  Gravida Para Term Preterm AB Living  2 1 1  0 0 1  SAB TAB Ectopic Multiple Live Births  0 0 0 0 1    # Outcome Date GA Lbr Len/2nd Weight Sex Delivery Anes PTL Lv  2 Current           1 Term 09/20/16 [redacted]w[redacted]d 17:44 / 04:03 6 lb 4.7 oz (2.855 kg) M Vag-Spont EPI, Local  LIV    Social History   Socioeconomic History  . Marital status: Single    Spouse name: Not on file  . Number of children: Not on file  . Years of education: Not on file  . Highest education level: Not on file  Occupational History  . Not on file  Social Needs  . Financial resource strain: Not on file  . Food insecurity:    Worry: Not on file    Inability: Not on file  . Transportation needs:    Medical: Not on file    Non-medical: Not on file  Tobacco Use  . Smoking status: Never Smoker  . Smokeless tobacco: Never Used  Substance and Sexual Activity  . Alcohol use: No    Alcohol/week: 0.0 standard drinks  . Drug use: No  . Sexual activity: Yes    Birth control/protection: None  Lifestyle  . Physical activity:    Days per week: Not on file    Minutes per session: Not on file  . Stress: Not on file  Relationships  . Social connections:    Talks on phone: Not on file    Gets  together: Not on file    Attends religious service: Not on file    Active member of club or organization: Not on file    Attends meetings of clubs or organizations: Not on file    Relationship status: Not on file  Other Topics Concern  . Not on file  Social History Narrative  . Not on file    Family History  Problem Relation Age of Onset  . Diabetes Father      Current Outpatient Medications:  .  promethazine (PHENERGAN) 12.5 MG tablet, Take 1 tablet (12.5 mg total) by mouth every 6 (six) hours as needed for nausea or vomiting., Disp: 30 tablet, Rfl: 1 .  acetaminophen (TYLENOL) 500 MG tablet, Take 1,000 mg by mouth every 6 (six) hours as needed for mild pain, moderate pain, fever or headache., Disp: , Rfl:  .  Doxylamine-Pyridoxine (DICLEGIS) 10-10 MG TBEC, Take 2 tablets by mouth at bedtime. If symptoms persist, add one tablet in the morning and one in the afternoon, Disp: 100 tablet, Rfl: 5 .  glycopyrrolate (ROBINUL) 1 MG tablet, Take 1 tablet (1 mg total) by mouth  3 (three) times daily., Disp: 90 tablet, Rfl: 1 .  Prenatal Multivit-Min-Fe-FA (PRENATAL VITAMINS) 0.8 MG tablet, Take 1 tablet by mouth daily. (Patient not taking: Reported on 07/05/2018), Disp: 30 tablet, Rfl: 12  No Known Allergies  Review of Systems: Negative except for what is mentioned in HPI.  Objective:   Vitals:   07/05/18 1445  BP: 137/80  Pulse: 87  Temp: 98.5 F (36.9 C)  Weight: 163 lb 6.4 oz (74.1 kg)    Fetal Status: Fetal Heart Rate (bpm): 179   Movement: Absent     Physical Exam: BP 137/80   Pulse 87   Temp 98.5 F (36.9 C)   Wt 163 lb 6.4 oz (74.1 kg)   LMP 04/23/2018   BMI 25.59 kg/m  CONSTITUTIONAL: Well-developed, well-nourished female in no acute distress.  NEUROLOGIC: Alert and oriented to person, place, and time. Normal reflexes, muscle tone coordination. No cranial nerve deficit noted. PSYCHIATRIC: Normal mood and affect. Normal behavior. Normal judgment and thought content.  SKIN: Skin is warm and dry. No rash noted. Not diaphoretic. No erythema. No pallor. HENT:  Normocephalic, atraumatic, External right and left ear normal. Oropharynx is clear and moist EYES: Conjunctivae and EOM are normal. Pupils are equal, round, and reactive to light. No scleral icterus.  NECK: Normal range of motion, supple, no masses CARDIOVASCULAR: Normal heart rate noted RESPIRATORY: Effort normal, no problems with respiration noted BREASTS: symmetric, non-tender, no masses palpable ABDOMEN: Soft, nontender, nondistended, gravid. GU: normal appearing external female genitalia, multiparous normal appearing cervix, scant white discharge in vagina, no lesions noted Bimanual: 10 weeks sized uterus, no adnexal tenderness or palpable lesions noted MUSCULOSKELETAL: Normal range of motion. EXT:  No edema and no tenderness. 2+ distal pulses.   Assessment and Plan:  Pregnancy: G2P1001 at [redacted]w[redacted]d by LMP  1. Morning sickness Phenergan not working Start diclegis To call if no improvement  2. Supervision of other normal pregnancy, antepartum - Obstetric Panel, Including HIV - Culture, OB Urine - [facility] AMB BABYSCRIPTS OPT IN - [facility] AMB BABYSCRIPTS SCHEDULE OPTIMIZATION - Genetic Screening - Cervicovaginal ancillary only( [place]) - Cytology - PAP   Preterm labor symptoms and general obstetric precautions including but not limited to vaginal bleeding, contractions, leaking of fluid and fetal movement were reviewed in detail with the patient.  Please refer to After Visit Summary for other counseling recommendations.   Return in about 1 month (around 08/05/2018) for OB visit, virtual.  Sloan Leiter 07/05/2018 3:22 PM

## 2018-07-05 NOTE — Progress Notes (Signed)
Pt presents for NOB visit. Pt c/o nausea no relief with Phenergan and also ptyalism.  Pt vomited during NOB intake.  This is not planned pregnancy but FOB is supportive.

## 2018-07-06 DIAGNOSIS — R7989 Other specified abnormal findings of blood chemistry: Secondary | ICD-10-CM | POA: Insufficient documentation

## 2018-07-06 LAB — OBSTETRIC PANEL, INCLUDING HIV
Antibody Screen: NEGATIVE
Basophils Absolute: 0.1 10*3/uL (ref 0.0–0.2)
Basos: 1 %
EOS (ABSOLUTE): 0.1 10*3/uL (ref 0.0–0.4)
Eos: 1 %
HIV Screen 4th Generation wRfx: NONREACTIVE
Hematocrit: 37.8 % (ref 34.0–46.6)
Hemoglobin: 13.3 g/dL (ref 11.1–15.9)
Hepatitis B Surface Ag: NEGATIVE
Immature Grans (Abs): 0 10*3/uL (ref 0.0–0.1)
Immature Granulocytes: 0 %
Lymphocytes Absolute: 1.9 10*3/uL (ref 0.7–3.1)
Lymphs: 22 %
MCH: 31.4 pg (ref 26.6–33.0)
MCHC: 35.2 g/dL (ref 31.5–35.7)
MCV: 89 fL (ref 79–97)
Monocytes Absolute: 0.9 10*3/uL (ref 0.1–0.9)
Monocytes: 10 %
Neutrophils Absolute: 5.5 10*3/uL (ref 1.4–7.0)
Neutrophils: 66 %
Platelets: 231 10*3/uL (ref 150–450)
RBC: 4.23 x10E6/uL (ref 3.77–5.28)
RDW: 13.1 % (ref 11.7–15.4)
RPR Ser Ql: NONREACTIVE
Rh Factor: POSITIVE
Rubella Antibodies, IGG: 3.43 index (ref >0.99)
WBC: 8.4 10*3/uL (ref 3.4–10.8)

## 2018-07-06 LAB — CERVICOVAGINAL ANCILLARY ONLY
Bacterial vaginitis: POSITIVE — AB
Candida vaginitis: NEGATIVE
Chlamydia: NEGATIVE
Neisseria Gonorrhea: NEGATIVE
Trichomonas: NEGATIVE

## 2018-07-06 LAB — TSH: TSH: 0.108 u[IU]/mL — ABNORMAL LOW (ref 0.450–4.500)

## 2018-07-06 LAB — CYTOLOGY - PAP: Diagnosis: NEGATIVE

## 2018-07-07 LAB — URINE CULTURE, OB REFLEX

## 2018-07-07 LAB — CULTURE, OB URINE

## 2018-07-10 ENCOUNTER — Other Ambulatory Visit: Payer: Medicaid Other

## 2018-07-10 ENCOUNTER — Other Ambulatory Visit: Payer: Self-pay

## 2018-07-10 DIAGNOSIS — Z3480 Encounter for supervision of other normal pregnancy, unspecified trimester: Secondary | ICD-10-CM

## 2018-07-11 MED ORDER — METRONIDAZOLE 500 MG PO TABS: 500.0000 mg | ORAL_TABLET | Freq: Two times a day (BID) | ORAL | 0 refills | Status: DC

## 2018-07-11 NOTE — Addendum Note (Signed)
Addended by: Vivien Rota on: 07/11/2018 04:24 PM   Modules accepted: Orders

## 2018-07-12 LAB — T4F+T3FREE
Free T-3: 3.5 pg/mL
Free Thyroxine: 1.5 ng/dL
Thyroxine (T4): 12.1 ug/dL
Triiodothyronine (T-3), Serum: 176 ng/dL — ABNORMAL HIGH

## 2018-07-13 ENCOUNTER — Other Ambulatory Visit: Payer: Self-pay | Admitting: *Deleted

## 2018-07-13 DIAGNOSIS — Z3480 Encounter for supervision of other normal pregnancy, unspecified trimester: Secondary | ICD-10-CM

## 2018-07-13 MED ORDER — GLYCOPYRROLATE 1 MG PO TABS: 1.0000 mg | ORAL_TABLET | Freq: Three times a day (TID) | ORAL | 1 refills | Status: DC

## 2018-07-13 NOTE — Progress Notes (Signed)
Pt called to office for Rx for spitting.  Pt states she did not have ins when Rx was initially discussed. Pt now has ins and request Rx be sent to pharmacy. Rx for Robinul was reordered to pharmacy today. Pt advised to call if any problems with Rx.

## 2018-07-14 ENCOUNTER — Encounter: Payer: Self-pay | Admitting: Obstetrics and Gynecology

## 2018-07-14 DIAGNOSIS — E0590 Thyrotoxicosis, unspecified without thyrotoxic crisis or storm: Secondary | ICD-10-CM | POA: Insufficient documentation

## 2018-07-17 ENCOUNTER — Encounter: Payer: Self-pay | Admitting: Obstetrics and Gynecology

## 2018-07-24 ENCOUNTER — Telehealth: Payer: Self-pay

## 2018-07-24 NOTE — Telephone Encounter (Signed)
Returned call, no answer left vm.

## 2018-07-26 ENCOUNTER — Telehealth: Payer: Self-pay

## 2018-07-26 NOTE — Telephone Encounter (Signed)
Returned call and pt complains of N&V despite taking phenergan and diclegis. Pt states that it is starting to affect her job performance. Routed to provider for review.

## 2018-07-27 ENCOUNTER — Telehealth: Payer: Self-pay

## 2018-07-27 ENCOUNTER — Other Ambulatory Visit: Payer: Self-pay | Admitting: Obstetrics and Gynecology

## 2018-07-27 MED ORDER — METOCLOPRAMIDE HCL 10 MG PO TABS: 10.0000 mg | ORAL_TABLET | Freq: Four times a day (QID) | ORAL | 2 refills | Status: DC | PRN

## 2018-07-27 NOTE — Progress Notes (Signed)
Reglan sent to pharmacy 

## 2018-07-27 NOTE — Telephone Encounter (Signed)
S/w pt and advised that provider states that she can try reglan for nausea and stop the phenergan, and take diclegis at night. Pt agreed, notified provider.

## 2018-08-02 ENCOUNTER — Inpatient Hospital Stay (HOSPITAL_COMMUNITY)
Admission: AD | Admit: 2018-08-02 | Discharge: 2018-08-02 | Disposition: A | Discharge status: Home / Self Care | Payer: PRIVATE HEALTH INSURANCE | Attending: Obstetrics and Gynecology | Admitting: Obstetrics and Gynecology

## 2018-08-02 ENCOUNTER — Other Ambulatory Visit: Payer: Self-pay

## 2018-08-02 ENCOUNTER — Encounter (HOSPITAL_COMMUNITY): Payer: Self-pay

## 2018-08-02 DIAGNOSIS — Z3A14 14 weeks gestation of pregnancy: Secondary | ICD-10-CM

## 2018-08-02 DIAGNOSIS — O26892 Other specified pregnancy related conditions, second trimester: Secondary | ICD-10-CM | POA: Diagnosis not present

## 2018-08-02 DIAGNOSIS — O99322 Drug use complicating pregnancy, second trimester: Secondary | ICD-10-CM | POA: Diagnosis not present

## 2018-08-02 DIAGNOSIS — F1210 Cannabis abuse, uncomplicated: Secondary | ICD-10-CM | POA: Diagnosis not present

## 2018-08-02 DIAGNOSIS — F12188 Cannabis abuse with other cannabis-induced disorder: Secondary | ICD-10-CM

## 2018-08-02 DIAGNOSIS — O210 Mild hyperemesis gravidarum: Secondary | ICD-10-CM

## 2018-08-02 DIAGNOSIS — R1110 Vomiting, unspecified: Secondary | ICD-10-CM | POA: Diagnosis present

## 2018-08-02 DIAGNOSIS — O219 Vomiting of pregnancy, unspecified: Secondary | ICD-10-CM | POA: Diagnosis not present

## 2018-08-02 DIAGNOSIS — Z79899 Other long term (current) drug therapy: Secondary | ICD-10-CM | POA: Insufficient documentation

## 2018-08-02 DIAGNOSIS — Z3492 Encounter for supervision of normal pregnancy, unspecified, second trimester: Secondary | ICD-10-CM

## 2018-08-02 DIAGNOSIS — Z833 Family history of diabetes mellitus: Secondary | ICD-10-CM | POA: Diagnosis not present

## 2018-08-02 HISTORY — DX: Cannabis abuse with other cannabis-induced disorder: F12.188

## 2018-08-02 LAB — URINALYSIS, ROUTINE W REFLEX MICROSCOPIC
Bilirubin Urine: NEGATIVE
Glucose, UA: NEGATIVE mg/dL
Hgb urine dipstick: NEGATIVE
Ketones, ur: 80 mg/dL — AB
Leukocytes,Ua: NEGATIVE
Nitrite: NEGATIVE
Protein, ur: 30 mg/dL — AB
Specific Gravity, Urine: 1.027 (ref 1.005–1.030)
pH: 5 (ref 5.0–8.0)

## 2018-08-02 LAB — RAPID URINE DRUG SCREEN, HOSP PERFORMED
Amphetamines: NOT DETECTED
Barbiturates: NOT DETECTED
Benzodiazepines: NOT DETECTED
Cocaine: NOT DETECTED
Opiates: NOT DETECTED
Tetrahydrocannabinol: POSITIVE — AB

## 2018-08-02 MED ORDER — LACTATED RINGERS IV BOLUS
1000.0000 mL | Freq: Once | INTRAVENOUS | Status: AC
  Administered 2018-08-02: 1000 mL via INTRAVENOUS

## 2018-08-02 MED ORDER — DIPHENHYDRAMINE HCL 25 MG PO TABS: 25.0000 mg | ORAL_TABLET | Freq: Four times a day (QID) | ORAL | 0 refills | Status: DC | PRN

## 2018-08-02 MED ORDER — M.V.I. ADULT IV INJ
Freq: Once | INTRAVENOUS | Status: AC
  Administered 2018-08-02: 19:00:00 via INTRAVENOUS
  Filled 2018-08-02: qty 10

## 2018-08-02 MED ORDER — DEXAMETHASONE SODIUM PHOSPHATE 10 MG/ML IJ SOLN
10.0000 mg | Freq: Once | INTRAMUSCULAR | Status: AC
  Administered 2018-08-02: 10 mg via INTRAVENOUS
  Filled 2018-08-02: qty 1

## 2018-08-02 MED ORDER — ONDANSETRON 4 MG PO TBDP
4.0000 mg | ORAL_TABLET | Freq: Once | ORAL | Status: AC
  Administered 2018-08-02: 4 mg via ORAL
  Filled 2018-08-02: qty 1

## 2018-08-02 MED ORDER — DIPHENHYDRAMINE HCL 50 MG/ML IJ SOLN
25.0000 mg | Freq: Once | INTRAMUSCULAR | Status: AC
  Administered 2018-08-02: 25 mg via INTRAVENOUS
  Filled 2018-08-02: qty 1

## 2018-08-02 MED ORDER — METOCLOPRAMIDE HCL 5 MG/ML IJ SOLN
10.0000 mg | Freq: Once | INTRAMUSCULAR | Status: AC
  Administered 2018-08-02: 10 mg via INTRAVENOUS
  Filled 2018-08-02: qty 2

## 2018-08-02 MED ORDER — FAMOTIDINE IN NACL 20-0.9 MG/50ML-% IV SOLN
20.0000 mg | Freq: Once | INTRAVENOUS | Status: AC
  Administered 2018-08-02: 20 mg via INTRAVENOUS
  Filled 2018-08-02: qty 50

## 2018-08-02 NOTE — MAU Note (Signed)
For the last 2 weeks, having nausea, vomiting and doctor keeps changing medications- 3 different prescriptions for vomiting and it's not helping.  No bleeding. No abd pain. Vomited 4 times. Nothing will stay down, tried chili last night- it's just sitting then came up last night, tried bagel and cereal, came up, and then lunch, chicken sandwich, it came up too.  Liquids, even water doesn't stay down.

## 2018-08-02 NOTE — Discharge Instructions (Signed)
Megan Schneider: you do not have Hyperemesis Gravidarum, but this handout contains the perfect tips on how to change your diet to minimize episodes of vomiting. Thank you! Mallie Snooks, CNM   Hyperemesis usually occurs during the first half (the first 20 weeks) of pregnancy. It often goes away once a woman is in her second half of pregnancy. However, sometimes hyperemesis continues through an entire pregnancy. What are the causes? The cause of this condition is not known. It may be related to changes in chemicals (hormones) in the body during pregnancy, such as the high level of pregnancy hormone (human chorionic gonadotropin) or the increase in the female sex hormone (estrogen). What are the signs or symptoms? Symptoms of this condition include:  Nausea that does not go away.  Vomiting that does not allow you to keep any food down.  Weight loss.  Body fluid loss (dehydration).  Having no desire to eat, or not liking food that you have previously enjoyed. How is this diagnosed? This condition may be diagnosed based on:  A physical exam.  Your medical history.  Your symptoms.  Blood tests.  Urine tests. How is this treated? This condition is managed by controlling symptoms. This may include:  Following an eating plan. This can help lessen nausea and vomiting.  Taking prescription medicines. An eating plan and medicines are often used together to help control symptoms. If medicines do not help relieve nausea and vomiting, you may need to receive fluids through an IV at the hospital. Follow these instructions at home: Eating and drinking   Avoid the following: ? Drinking fluids with meals. Try not to drink anything during the 30 minutes before and after your meals. ? Drinking more than 1 cup of fluid at a time. ? Eating foods that trigger your symptoms. These may include spicy foods, coffee, high-fat foods, very sweet foods, and acidic foods. ? Skipping meals. Nausea can  be more intense on an empty stomach. If you cannot tolerate food, do not force it. Try sucking on ice chips or other frozen items and make up for missed calories later. ? Lying down within 2 hours after eating. ? Being exposed to environmental triggers. These may include food smells, smoky rooms, closed spaces, rooms with strong smells, warm or humid places, overly loud and noisy rooms, and rooms with motion or flickering lights. Try eating meals in a well-ventilated area that is free of strong smells. ? Quick and sudden changes in your movement. ? Taking iron pills and multivitamins that contain iron. If you take prescription iron pills, do not stop taking them unless your health care provider approves. ? Preparing food. The smell of food can spoil your appetite or trigger nausea.  To help relieve your symptoms: ? Listen to your body. Everyone is different and has different preferences. Find what works best for you. ? Eat and drink slowly. ? Eat 5-6 small meals daily instead of 3 large meals. Eating small meals and snacks can help you avoid an empty stomach. ? In the morning, before getting out of bed, eat a couple of crackers to avoid moving around on an empty stomach. ? Try eating starchy foods as these are usually tolerated well. Examples include cereal, toast, bread, potatoes, pasta, rice, and pretzels. ? Include at least 1 serving of protein with your meals and snacks. Protein options include lean meats, poultry, seafood, beans, nuts, nut butters, eggs, cheese, and yogurt. ? Try eating a protein-rich snack before bed. Examples of a protein-rick  snack include cheese and crackers or a peanut butter sandwich made with 1 slice of whole-wheat bread and 1 tsp (5 g) of peanut butter. ? Eat or suck on things that have ginger in them. It may help relieve nausea. Add  tsp ground ginger to hot tea or choose ginger tea. ? Try drinking 100% fruit juice or an electrolyte drink. An electrolyte drink contains  sodium, potassium, and chloride. ? Drink fluids that are cold, clear, and carbonated or sour. Examples include lemonade, ginger ale, lemon-lime soda, ice water, and sparkling water. ? Brush your teeth or use a mouth rinse after meals. ? Talk with your health care provider about starting a supplement of vitamin B6. General instructions  Take over-the-counter and prescription medicines only as told by your health care provider.  Follow instructions from your health care provider about eating or drinking restrictions.  Continue to take your prenatal vitamins as told by your health care provider. If you are having trouble taking your prenatal vitamins, talk with your health care provider about different options.  Keep all follow-up and pre-birth (prenatal) visits as told by your health care provider. This is important. Contact a health care provider if:  You have pain in your abdomen.  You have a severe headache.  You have vision problems.  You are losing weight.  You feel weak or dizzy. Get help right away if:  You cannot drink fluids without vomiting.  You vomit blood.  You have constant nausea and vomiting.  You are very weak.  You faint.  You have a fever and your symptoms suddenly get worse. Summary  Cannabinoid Hyperemesis Syndrome Cannabinoid hyperemesis syndrome (CHS) is a condition that causes repeated nausea, vomiting, and abdominal pain after long-term (chronic) use of marijuana (cannabis). People with CHS typically use marijuana 3-5 times a day for many years before they have symptoms, although it is possible to develop CHS with as little as 1 use per day. Symptoms of CHS may be mild at first but can get worse and more frequent. In some cases, CHS may cause vomiting many times a day, which can lead to weight loss and dehydration. CHS may go away and come back many times (recur). People may not have symptoms or may otherwise be healthy in between [facility] attacks. What are  the causes? The exact cause of this condition is not known. Long-term use of marijuana may over-stimulate certain proteins in the brain that react with chemicals in marijuana (cannabinoid receptors). This over-stimulation may cause CHS. What are the signs or symptoms? Symptoms of this condition are often mild during the first few attacks, but they can get worse over time. Symptoms may include: Frequent nausea, especially early in the morning. Vomiting. Abdominal pain. Taking several hot showers throughout the day can also be a sign of this condition. People with CHS may do this because it relieves symptoms. How is this diagnosed? This condition may be diagnosed based on: Your symptoms and medical history, including any drug use. A physical exam. You may have tests done to rule out other problems. These tests may include: Blood tests. Urine tests. Imaging tests, such as an X-ray or CT scan. How is this treated? Treatment for this condition involves stopping marijuana use. Your health care provider may recommend: A drug rehabilitation program, if you have trouble stopping marijuana use. Medicines for nausea. Hot showers to help relieve symptoms. Certain creams that contain a substance called capsaicin may improve symptoms when applied to the abdomen. Ask your  health care provider before starting any medicines or other treatments. Severe nausea and vomiting may require you to stay at the hospital. You may need IV fluids to prevent or treat dehydration. You may also need certain medicines that must be given at the hospital. Follow these instructions at home: During an attack  Stay in bed and rest in a dark, quiet room. Take anti-nausea medicine as told by your health care provider. Try taking hot showers to relieve your symptoms. After an attack Drink small amounts of clear fluids slowly. Gradually add more. Once you are able to eat without vomiting, eat soft foods in small amounts every  3-4 hours. General instructions  Do not use any products that contain marijuana.If you need help quitting, ask your health care provider for resources and treatment options. Drink enough fluid to keep your urine pale yellow. Avoid drinking fluids that have a lot of sugar or caffeine, such as coffee and soda. Take and apply over-the-counter and prescription medicines only as told by your health care provider. Ask your health care provider before starting any new medicines or treatments. Keep all follow-up visits as told by your health care provider. This is important. Contact a health care provider if: Your symptoms get worse. You cannot drink fluids without vomiting. You have pain and trouble swallowing after an attack. Get help right away if: You cannot stop vomiting. You have blood in your vomit or your vomit looks like coffee grounds. You have severe abdominal pain. You have stools that are bloody or black, or stools that look like tar. You have symptoms of dehydration, such as: Sunken eyes. Inability to make tears. Cracked lips. Dry mouth. Decreased urine production. Weakness. Sleepiness. Fainting. Summary Cannabinoid hyperemesis syndrome (CHS) is a condition that causes repeated nausea, vomiting, and abdominal pain after long-term use of marijuana. People with CHS typically use marijuana 3-5 times a day for many years before they have symptoms, although it is possible to develop CHS with as little as 1 use per day. Treatment for this condition involves stopping marijuana use. Hot showers and capsaicin creams may also help relieve symptoms. Ask your health care provider before starting any medicines or other treatments. Your health care provider may prescribe medicines to help with nausea. Get help right away if you have signs of dehydration, such as dry mouth, decreased urine production, or weakness. This information is not intended to replace advice given to you by your health  care provider. Make sure you discuss any questions you have with your health care provider. Document Released: 05/11/2016 Document Revised: 05/11/2016 Document Reviewed: 05/11/2016 Elsevier Interactive Patient Education  2019 [place] some changes to your eating habits may help relieve nausea and vomiting.  This condition may be managed with medicine.  If medicines do not help relieve nausea and vomiting, you may need to receive fluids through an IV at the hospital. This information is not intended to replace advice given to you by your health care provider. Make sure you discuss any questions you have with your health care provider. Document Released: 01/31/2005 Document Revised: 02/20/2017 Document Reviewed: 09/30/2015 Elsevier Interactive Patient Education  2019 [employer].

## 2018-08-02 NOTE — MAU Provider Note (Signed)
History     CSN: [id number]  Arrival date and time: 08/02/18 1556   First Provider Initiated Contact with Patient 08/02/18 1644     Chief Complaint  Patient presents with  . Emesis During Pregnancy   HPI   Megan Schneider is a 28 y.o. G2P1001 at 29w3dwho presents to MAU with chief complaint of recurrent and severe nausea and vomiting. Patient has had multiple visits to MAU and clinic and multiple changes to her medication for this complaint. She reports that she vomits an average of 4 times each morning but continues to experience nausea with occasional vomiting throughout the day every day.  She verbalizes frustration with her medication regimen and lack of success. Patient states her insurance prevented her from filling many of the medications she was prescribed. She states Reglan worked well for her for two days "then it's like I became immune to it". She continues to take her PNV and Flagyl. She states she has only taken those two medications plus Robinul today.  Patient states she was successful with Phenergan but it made her too sleepy to care for her 27year old so she stopped taking it.  Patient's 24 hours diet recall is significant for chili last night and a chicken sandwich for dinner. Patient acknowledges smoking THC today.  OB History    Gravida  2   Para  1   Term  1   Preterm  0   AB  0   Living  1     SAB  0   TAB  0   Ectopic  0   Multiple  0   Live Births  1          Patient Active Problem List   Diagnosis Date Noted  . Cannabis hyperemesis syndrome concurrent with and due to cannabis abuse ([place] 08/02/2018  . Subclinical hyperthyroidism 07/14/2018  . Low TSH level 07/06/2018  . Encounter for supervision of normal pregnancy, antepartum 07/05/2018  . Morning sickness 06/22/2018   Past Medical History:  Diagnosis Date  . Asthma    occasionally, exercise-induced; uses inhaler; not used "in a while"  . Scoliosis     Past Surgical  History:  Procedure Laterality Date  . NO PAST SURGERIES      Family History  Problem Relation Age of Onset  . Diabetes Father     Social History   Tobacco Use  . Smoking status: Never Smoker  . Smokeless tobacco: Never Used  Substance Use Topics  . Alcohol use: No    Alcohol/week: 0.0 standard drinks  . Drug use: Not Currently    Types: Marijuana    Comment: reported last time was when "[redacted] weeks pregnant"    Allergies: No Known Allergies  Medications Prior to Admission  Medication Sig Dispense Refill Last Dose  . Doxylamine-Pyridoxine (DICLEGIS) 10-10 MG TBEC Take 2 tablets by mouth at bedtime. If symptoms persist, add one tablet in the morning and one in the afternoon 100 tablet 5 08/01/2018 at Unknown time  . glycopyrrolate (ROBINUL) 1 MG tablet Take 1 tablet (1 mg total) by mouth 3 (three) times daily. 90 tablet 1 08/01/2018 at Unknown time  . metroNIDAZOLE (FLAGYL) 500 MG tablet Take 1 tablet (500 mg total) by mouth 2 (two) times daily. 14 tablet 0 08/01/2018 at Unknown time  . Prenatal Multivit-Min-Fe-FA (PRENATAL VITAMINS) 0.8 MG tablet Take 1 tablet by mouth daily. 30 tablet 12 08/02/2018 at Unknown time  . promethazine (PHENERGAN) 12.5 MG tablet  Take 1 tablet (12.5 mg total) by mouth every 6 (six) hours as needed for nausea or vomiting. 30 tablet 1 Past Month at Unknown time  . acetaminophen (TYLENOL) 500 MG tablet Take 1,000 mg by mouth every 6 (six) hours as needed for mild pain, moderate pain, fever or headache.   More than a month at Unknown time  . Blood Pressure KIT Z34.90 To monitored regularly at home Large 1 each 0   . metoCLOPramide (REGLAN) 10 MG tablet Take 1 tablet (10 mg total) by mouth 4 (four) times daily as needed for nausea or vomiting. 30 tablet 2     Review of Systems  Constitutional: Positive for fatigue. Negative for chills and fever.  Gastrointestinal: Positive for nausea and vomiting. Negative for abdominal pain.  Genitourinary: Negative for  decreased urine volume, difficulty urinating, dysuria, flank pain and vaginal bleeding.  Musculoskeletal: Negative for back pain.  Neurological: Negative for dizziness, syncope, weakness and headaches.  All other systems reviewed and are negative.  Physical Exam   Blood pressure 136/77, pulse 94, temperature 98.1 F (36.7 C), temperature source Oral, resp. rate 16, height '5\' 7"'  (1.702 m), weight 72.6 kg, last menstrual period 04/23/2018, not currently breastfeeding.  Physical Exam  Nursing note and vitals reviewed. Constitutional: She is oriented to person, place, and time. She appears well-developed and well-nourished.  Cardiovascular: Normal rate.  Respiratory: Effort normal.  GI: Soft. Bowel sounds are normal. She exhibits no distension. There is no abdominal tenderness. There is no rigidity, no rebound, no guarding and no CVA tenderness.  Genitourinary:    Genitourinary Comments: Not evaluated based on chief complaint   Musculoskeletal: Normal range of motion.  Neurological: She is alert and oriented to person, place, and time.  Skin: Skin is warm and dry.  Psychiatric: She has a normal mood and affect. Her behavior is normal. Judgment and thought content normal.   MAU Course/MDM  Procedures   --Patient spitting but not vomiting during evaluation in MAU --Greater than 20 minutes spent at bedside discussing diet modification, realistic expectations for food selection, focus on bland solids and protein, slow eating. Summarized in AVS  Patient Vitals for the past 24 hrs:  BP Temp Temp src Pulse Resp SpO2 Height Weight  08/02/18 2032 (!) 117/58 98.3 F (36.8 C) Oral 77 16 100 % - -  08/02/18 1631 136/77 - - 94 - - - -  08/02/18 1629 - 98.1 F (36.7 C) Oral - 16 - '5\' 7"'  (1.702 m) 72.6 kg   Meds ordered this encounter  Medications  . ondansetron (ZOFRAN-ODT) disintegrating tablet 4 mg  . lactated ringers bolus 1,000 mL  . multivitamins adult (INFUVITE ADULT) 10 mL in lactated  ringers 1,000 mL infusion  . metoCLOPramide (REGLAN) injection 10 mg  . diphenhydrAMINE (BENADRYL) injection 25 mg  . famotidine (PEPCID) IVPB 20 mg premix  . dexamethasone (DECADRON) injection 10 mg  . diphenhydrAMINE (BENADRYL) 25 MG tablet    Sig: Take 1 tablet (25 mg total) by mouth every 6 (six) hours as needed. Take alone or with Reglan for management of nausea and vomiting in second trimester    Dispense:  30 tablet    Refill:  0    Order Specific Question:   Supervising Provider    Answer:   Merrily Pew   Results for orders placed or performed during the hospital encounter of 08/02/18 (from the past 24 hour(s))  Rapid urine drug screen (hospital performed)     Status:  Abnormal   Collection Time: 08/02/18  4:10 PM  Result Value Ref Range   Opiates NONE DETECTED NONE DETECTED   Cocaine NONE DETECTED NONE DETECTED   Benzodiazepines NONE DETECTED NONE DETECTED   Amphetamines NONE DETECTED NONE DETECTED   Tetrahydrocannabinol POSITIVE (A) NONE DETECTED   Barbiturates NONE DETECTED NONE DETECTED  Urinalysis, Routine w reflex microscopic     Status: Abnormal   Collection Time: 08/02/18  4:40 PM  Result Value Ref Range   Color, Urine YELLOW YELLOW   APPearance CLOUDY (A) CLEAR   Specific Gravity, Urine 1.027 1.005 - 1.030   pH 5.0 5.0 - 8.0   Glucose, UA NEGATIVE NEGATIVE mg/dL   Hgb urine dipstick NEGATIVE NEGATIVE   Bilirubin Urine NEGATIVE NEGATIVE   Ketones, ur 80 (A) NEGATIVE mg/dL   Protein, ur 30 (A) NEGATIVE mg/dL   Nitrite NEGATIVE NEGATIVE   Leukocytes,Ua NEGATIVE NEGATIVE   RBC / HPF 0-5 0 - 5 RBC/hpf   WBC, UA 0-5 0 - 5 WBC/hpf   Bacteria, UA FEW (A) NONE SEEN   Squamous Epithelial / LPF 6-10 0 - 5   Mucus PRESENT    Assessment and Plan  --28 y.o. G2P1001 at [redacted]w[redacted]d --FHT 156 by Doppler --+ THC in MAU, patient report of THC use --Cannabis Hyperemesis exacerbated by poor diet modification --Patient to restart Reglan, continue Robinul as previously  advised by Dr. DRosana Hoes--Patient may add Benadryl to replicate treatment in MAU PRN, rx to pharmacy --Patient tolerating PO prior to discharge --Discharge home in stable condition  SDarlina Rumpf CNM 08/02/2018, 8:54 PM

## 2018-08-06 ENCOUNTER — Ambulatory Visit (INDEPENDENT_AMBULATORY_CARE_PROVIDER_SITE_OTHER): Payer: Medicaid Other | Admitting: Obstetrics and Gynecology

## 2018-08-06 ENCOUNTER — Encounter: Payer: Self-pay | Admitting: Obstetrics and Gynecology

## 2018-08-06 DIAGNOSIS — Z3480 Encounter for supervision of other normal pregnancy, unspecified trimester: Secondary | ICD-10-CM

## 2018-08-06 DIAGNOSIS — Z3482 Encounter for supervision of other normal pregnancy, second trimester: Secondary | ICD-10-CM

## 2018-08-06 DIAGNOSIS — Z3A15 15 weeks gestation of pregnancy: Secondary | ICD-10-CM

## 2018-08-06 DIAGNOSIS — E0590 Thyrotoxicosis, unspecified without thyrotoxic crisis or storm: Secondary | ICD-10-CM

## 2018-08-06 NOTE — Progress Notes (Signed)
   TELEHEALTH OBSTETRICS PRENATAL VIRTUAL VIDEO VISIT ENCOUNTER NOTE  Provider location: [facility] at [place]   I connected with Particia Nearing on 08/06/18 at  3:45 PM EDT by WebEx Video Encounter at home and verified that I am speaking with the correct person using two identifiers.   I discussed the limitations, risks, security and privacy concerns of performing an evaluation and management service by telephone and the availability of in person appointments. I also discussed with the patient that there may be a patient responsible charge related to this service. The patient expressed understanding and agreed to proceed. Subjective:  Megan Schneider is a 28 y.o. G2P1001 at [redacted]w[redacted]d being seen today for ongoing prenatal care.  She is currently monitored for the following issues for this low-risk pregnancy and has Morning sickness; Encounter for supervision of normal pregnancy, antepartum; Low TSH level; Subclinical hyperthyroidism; and Cannabis hyperemesis syndrome concurrent with and due to cannabis abuse ([place]) on their problem list.  Patient reports persistent nausea/emesis.  Contractions: Not present. Vag. Bleeding: None.  Movement: Present. Denies any leaking of fluid.   The following portions of the patient's history were reviewed and updated as appropriate: allergies, current medications, past family history, past medical history, past social history, past surgical history and problem list.   Objective:  There were no vitals filed for this visit.  Fetal Status:     Movement: Present     General:  Alert, oriented and cooperative. Patient is in no acute distress.  Respiratory: Normal respiratory effort, no problems with respiration noted  Mental Status: Normal mood and affect. Normal behavior. Normal judgment and thought content.  Rest of physical exam deferred due to type of encounter  Imaging: No results found.  Assessment and Plan:  Pregnancy: G2P1001 at [redacted]w[redacted]d  1. Supervision of other normal pregnancy, antepartum Patient is doing well Continue diclegis, reglan and phenergan Anatomy ultrasound and AFP on 7/21  2. Subclinical hyperthyroidism Repeat labs on 7/21 with ultrasound Orders in EPIC  Preterm labor symptoms and general obstetric precautions including but not limited to vaginal bleeding, contractions, leaking of fluid and fetal movement were reviewed in detail with the patient. I discussed the assessment and treatment plan with the patient. The patient was provided an opportunity to ask questions and all were answered. The patient agreed with the plan and demonstrated an understanding of the instructions. The patient was advised to call back or seek an in-person office evaluation/go to MAU at [facility] for any urgent or concerning symptoms. Please refer to After Visit Summary for other counseling recommendations.   I provided 15 minutes of face-to-face time during this encounter.  Return in about 4 weeks (around 09/03/2018) for [facility], [place].  Future Appointments  Date Time Provider [place]  09/03/2018  3:45 PM Kartik Fernando, Vickii Chafe, MD [place] None  09/04/2018  2:30 PM WH-MFC [place] 1 WH-MFCUS MFC-US    Shad Ledvina, MD [facility], [place]

## 2018-08-06 NOTE — Progress Notes (Signed)
Pt is on the phone preparing for virtual visit with provider. [redacted]w[redacted]d. Anatomy US scheduled for 09/04/18. Pt has not received BP cuff that was ordered 07/05/18. I advised pt I will look into what is going on with that. Pt denies any HA or blurry vision.

## 2018-08-30 ENCOUNTER — Telehealth: Payer: Self-pay | Admitting: *Deleted

## 2018-08-30 NOTE — Telephone Encounter (Signed)
Pt called to office stating that she is still having severe N&V, she is not able to keep anything down. Pt wants to know if there is other medication she may take.  Return call to pt. Pt states she is having severe N&V, vomited multiple times yesterday. Pt states she is having to now miss work due to this. Pt states she is taking Reglan, Diclegis and ?Phenergan with no relief.  Pt states she doesn't feel that she wants to eat. Pt was advised that if she is not able to tolerate any solids/ fluids, she will need to be seen at the hospital for eval and possible fluids.  Pt advised to discuss with hospital provider medication she is taking and determine plan before being discharged. Pt has Webex appt on Monday and was advised to keep as follow up and medication management.   Pt states understanding.

## 2018-09-03 ENCOUNTER — Ambulatory Visit (INDEPENDENT_AMBULATORY_CARE_PROVIDER_SITE_OTHER): Payer: PRIVATE HEALTH INSURANCE | Admitting: Obstetrics and Gynecology

## 2018-09-03 ENCOUNTER — Encounter: Payer: Self-pay | Admitting: Obstetrics and Gynecology

## 2018-09-03 ENCOUNTER — Telehealth: Payer: Self-pay | Admitting: Family Medicine

## 2018-09-03 VITALS — BP 121/79 | HR 89

## 2018-09-03 DIAGNOSIS — Z3482 Encounter for supervision of other normal pregnancy, second trimester: Secondary | ICD-10-CM

## 2018-09-03 DIAGNOSIS — Z3A19 19 weeks gestation of pregnancy: Secondary | ICD-10-CM

## 2018-09-03 DIAGNOSIS — Z3480 Encounter for supervision of other normal pregnancy, unspecified trimester: Secondary | ICD-10-CM

## 2018-09-03 DIAGNOSIS — E0590 Thyrotoxicosis, unspecified without thyrotoxic crisis or storm: Secondary | ICD-10-CM

## 2018-09-03 NOTE — Progress Notes (Signed)
Pt is on the phone preparing for virtual visit with provider. [redacted]w[redacted]d. Lab appt and anatomy scan [place] scheduled for tomorrow.

## 2018-09-03 NOTE — Telephone Encounter (Signed)
Spoke to patient about her appointment on 7/21 @ 1:30. Patient instructed to wear a face mask for the entire appointment and no visitors are allowed with her during the visit. Patient screened for covid symptoms and denied having any

## 2018-09-03 NOTE — Progress Notes (Signed)
   TELEHEALTH OBSTETRICS PRENATAL VIRTUAL VIDEO VISIT ENCOUNTER NOTE  Provider location: [facility] at [employer]   I connected with Particia Nearing on 09/03/18 at  3:45 PM EDT by WebEx Video Encounter at home and verified that I am speaking with the correct person using two identifiers.   I discussed the limitations, risks, security and privacy concerns of performing an evaluation and management service virtually and the availability of in person appointments. I also discussed with the patient that there may be a patient responsible charge related to this service. The patient expressed understanding and agreed to proceed. Subjective:  Megan Schneider is a 28 y.o. G2P1001 at [redacted]w[redacted]d being seen today for ongoing prenatal care.  She is currently monitored for the following issues for this low-risk pregnancy and has Morning sickness; Encounter for supervision of normal pregnancy, antepartum; Low TSH level; Subclinical hyperthyroidism; and Cannabis hyperemesis syndrome concurrent with and due to cannabis abuse ([place]) on their problem list.  Patient reports no complaints.  Contractions: Not present. Vag. Bleeding: None.   . Denies any leaking of fluid.   The following portions of the patient's history were reviewed and updated as appropriate: allergies, current medications, past family history, past medical history, past social history, past surgical history and problem list.   Objective:   Vitals:   09/03/18 1551  BP: 121/79  Pulse: 89    Fetal Status:           General:  Alert, oriented and cooperative. Patient is in no acute distress.  Respiratory: Normal respiratory effort, no problems with respiration noted  Mental Status: Normal mood and affect. Normal behavior. Normal judgment and thought content.  Rest of physical exam deferred due to type of encounter  Imaging: No results found.  Assessment and Plan:  Pregnancy: G2P1001 at [redacted]w[redacted]d 1. Supervision of other normal  pregnancy, antepartum Patient is doing well  Follow up anatomy ultrasound tomorrow AFP tomorrow Third trimester labs next visit  2. Subclinical hyperthyroidism Repeat labs tomorrow  Preterm labor symptoms and general obstetric precautions including but not limited to vaginal bleeding, contractions, leaking of fluid and fetal movement were reviewed in detail with the patient. I discussed the assessment and treatment plan with the patient. The patient was provided an opportunity to ask questions and all were answered. The patient agreed with the plan and demonstrated an understanding of the instructions. The patient was advised to call back or seek an in-person office evaluation/go to [facility]U at [facility] for any urgent or concerning symptoms. Please refer to After Visit Summary for other counseling recommendations.   I provided 11 minutes of face-to-face time during this encounter.  Return in about 8 weeks (around 10/29/2018) for IN PERSON: , ROB, 2 hr glucola next visit.  Future Appointments  Date Time Provider [place]  09/04/2018  1:30 PM [facility] LAB WOC-WOCA WOC  09/04/2018  2:30 PM [place] 1 WH-MFCUS MFC-US    Mora Bellman, MD [facility], [place]

## 2018-09-04 ENCOUNTER — Ambulatory Visit (HOSPITAL_COMMUNITY)
Admission: RE | Admit: 2018-09-04 | Discharge: 2018-09-04 | Disposition: A | Discharge status: Home / Self Care | Payer: PRIVATE HEALTH INSURANCE | Source: Ambulatory Visit | Attending: Obstetrics and Gynecology | Admitting: Obstetrics and Gynecology

## 2018-09-04 ENCOUNTER — Other Ambulatory Visit: Payer: Self-pay

## 2018-09-04 ENCOUNTER — Other Ambulatory Visit: Payer: Self-pay | Admitting: Obstetrics and Gynecology

## 2018-09-04 ENCOUNTER — Other Ambulatory Visit: Payer: PRIVATE HEALTH INSURANCE

## 2018-09-04 DIAGNOSIS — Z3A17 17 weeks gestation of pregnancy: Secondary | ICD-10-CM | POA: Diagnosis not present

## 2018-09-04 DIAGNOSIS — Z363 Encounter for antenatal screening for malformations: Secondary | ICD-10-CM | POA: Diagnosis not present

## 2018-09-04 DIAGNOSIS — Z3689 Encounter for other specified antenatal screening: Secondary | ICD-10-CM

## 2018-09-04 DIAGNOSIS — E0590 Thyrotoxicosis, unspecified without thyrotoxic crisis or storm: Secondary | ICD-10-CM

## 2018-09-04 DIAGNOSIS — Z3480 Encounter for supervision of other normal pregnancy, unspecified trimester: Secondary | ICD-10-CM

## 2018-09-04 DIAGNOSIS — Z3687 Encounter for antenatal screening for uncertain dates: Secondary | ICD-10-CM

## 2018-09-05 ENCOUNTER — Other Ambulatory Visit: Payer: Self-pay | Admitting: Obstetrics and Gynecology

## 2018-09-05 ENCOUNTER — Other Ambulatory Visit (HOSPITAL_COMMUNITY): Payer: Self-pay | Admitting: *Deleted

## 2018-09-05 DIAGNOSIS — Z3480 Encounter for supervision of other normal pregnancy, unspecified trimester: Secondary | ICD-10-CM

## 2018-09-05 DIAGNOSIS — Z362 Encounter for other antenatal screening follow-up: Secondary | ICD-10-CM

## 2018-09-05 DIAGNOSIS — Z3687 Encounter for antenatal screening for uncertain dates: Secondary | ICD-10-CM

## 2018-09-05 DIAGNOSIS — Z3A17 17 weeks gestation of pregnancy: Secondary | ICD-10-CM

## 2018-09-05 DIAGNOSIS — Z363 Encounter for antenatal screening for malformations: Secondary | ICD-10-CM

## 2018-09-13 LAB — AFP, SERUM, OPEN SPINA BIFIDA
AFP MoM: 1.22
AFP Value: 65.3 ng/mL
Gest. Age on Collection Date: 19.1 weeks
Maternal Age At EDD: 26.9 yr
OSBR Risk 1 IN: 10000
Test Results:: NEGATIVE
Weight: 154 [lb_av]

## 2018-09-13 LAB — T4, FREE: Free T4: 1.11 ng/dL (ref 0.82–1.77)

## 2018-09-13 LAB — T3, FREE: T3, Free: 2.8 pg/mL (ref 2.0–4.4)

## 2018-09-13 LAB — TSH: TSH: 1.68 u[IU]/mL (ref 0.450–4.500)

## 2018-09-20 ENCOUNTER — Telehealth: Payer: Self-pay | Admitting: Obstetrics and Gynecology

## 2018-09-20 DIAGNOSIS — O23592 Infection of other part of genital tract in pregnancy, second trimester: Secondary | ICD-10-CM

## 2018-09-20 DIAGNOSIS — O4692 Antepartum hemorrhage, unspecified, second trimester: Secondary | ICD-10-CM

## 2018-09-20 DIAGNOSIS — N760 Acute vaginitis: Secondary | ICD-10-CM

## 2018-09-20 MED ORDER — CLOTRIMAZOLE 1 % VA CREA: 1.0000 | TOPICAL_CREAM | Freq: Every day | VAGINAL | 2 refills | Status: AC

## 2018-09-20 MED ORDER — HYDROCORTISONE ACETATE 25 MG RE SUPP: 25.0000 mg | Freq: Two times a day (BID) | RECTAL | 1 refills | Status: DC

## 2018-09-20 NOTE — Telephone Encounter (Signed)
Patient called requesting something for her hemorrhoids, she is using over the counter cream and it is not working.    She is also "broken out" on her vagina.  She has been using cortizone cream for this area but feels it is not helping  She had the similar genital rash last pregnancy and was given Lotrimin cream.    Routing to provider for prescriptions.

## 2018-09-20 NOTE — Addendum Note (Signed)
Addended by: Verita Schneiders A on: 09/20/2018 03:38 PM   Modules accepted: Orders

## 2018-09-20 NOTE — Telephone Encounter (Signed)
Prescriptions sent in as requested, patient notified via [place].   Verita Schneiders, MD

## 2018-10-02 ENCOUNTER — Ambulatory Visit (HOSPITAL_COMMUNITY)
Admission: RE | Admit: 2018-10-02 | Discharge: 2018-10-02 | Disposition: A | Discharge status: Home / Self Care | Payer: PRIVATE HEALTH INSURANCE | Source: Ambulatory Visit | Attending: Obstetrics and Gynecology | Admitting: Obstetrics and Gynecology

## 2018-10-02 ENCOUNTER — Ambulatory Visit (HOSPITAL_COMMUNITY): Payer: PRIVATE HEALTH INSURANCE | Admitting: *Deleted

## 2018-10-02 ENCOUNTER — Other Ambulatory Visit: Payer: Self-pay

## 2018-10-02 ENCOUNTER — Encounter (HOSPITAL_COMMUNITY): Payer: Self-pay | Admitting: *Deleted

## 2018-10-02 DIAGNOSIS — F12188 Cannabis abuse with other cannabis-induced disorder: Secondary | ICD-10-CM

## 2018-10-02 DIAGNOSIS — O210 Mild hyperemesis gravidarum: Secondary | ICD-10-CM | POA: Insufficient documentation

## 2018-10-02 DIAGNOSIS — Z362 Encounter for other antenatal screening follow-up: Secondary | ICD-10-CM | POA: Diagnosis not present

## 2018-10-02 DIAGNOSIS — Z3A21 21 weeks gestation of pregnancy: Secondary | ICD-10-CM

## 2018-10-29 ENCOUNTER — Ambulatory Visit (INDEPENDENT_AMBULATORY_CARE_PROVIDER_SITE_OTHER): Payer: PRIVATE HEALTH INSURANCE | Admitting: Obstetrics and Gynecology

## 2018-10-29 ENCOUNTER — Other Ambulatory Visit: Payer: PRIVATE HEALTH INSURANCE

## 2018-10-29 ENCOUNTER — Encounter: Payer: Self-pay | Admitting: Obstetrics and Gynecology

## 2018-10-29 ENCOUNTER — Other Ambulatory Visit: Payer: Self-pay

## 2018-10-29 VITALS — BP 126/74 | HR 71 | Wt 167.4 lb

## 2018-10-29 DIAGNOSIS — Z23 Encounter for immunization: Secondary | ICD-10-CM

## 2018-10-29 DIAGNOSIS — E0590 Thyrotoxicosis, unspecified without thyrotoxic crisis or storm: Secondary | ICD-10-CM

## 2018-10-29 DIAGNOSIS — Z3A25 25 weeks gestation of pregnancy: Secondary | ICD-10-CM

## 2018-10-29 DIAGNOSIS — Z3480 Encounter for supervision of other normal pregnancy, unspecified trimester: Secondary | ICD-10-CM

## 2018-10-29 DIAGNOSIS — Z3482 Encounter for supervision of other normal pregnancy, second trimester: Secondary | ICD-10-CM

## 2018-10-29 NOTE — Progress Notes (Signed)
Could not complete the 2 hour gtt due to being sick Decline  tdap

## 2018-10-29 NOTE — Progress Notes (Addendum)
   PRENATAL VISIT NOTE  Subjective:  Megan Schneider is a 28 y.o. G2P1001 at [redacted]w[redacted]d being seen today for ongoing prenatal care.  She is currently monitored for the following issues for this low-risk pregnancy and has Morning sickness; Encounter for supervision of normal pregnancy, antepartum; Low TSH level; Subclinical hyperthyroidism; and Cannabis hyperemesis syndrome concurrent with and due to cannabis abuse ([place]) on their problem list.  Patient reports no complaints.  Contractions: Not present.  .  Movement: Present. Denies leaking of fluid.   The following portions of the patient's history were reviewed and updated as appropriate: allergies, current medications, past family history, past medical history, past social history, past surgical history and problem list.   Objective:   Vitals:   10/29/18 0917  BP: 126/74  Pulse: 71  Weight: 167 lb 6.4 oz (75.9 kg)    Fetal Status: Fetal Heart Rate (bpm): 146   Movement: Present     General:  Alert, oriented and cooperative. Patient is in no acute distress.  Skin: Skin is warm and dry. No rash noted.   Cardiovascular: Normal heart rate noted  Respiratory: Normal respiratory effort, no problems with respiration noted  Abdomen: Soft, gravid, appropriate for gestational age.  Pain/Pressure: Absent     Pelvic: Cervical exam deferred        Extremities: Normal range of motion.  Edema: None  Mental Status: Normal mood and affect. Normal behavior. Normal judgment and thought content.   Assessment and Plan:  Pregnancy: G2P1001 at [redacted]w[redacted]d  1. Supervision of other normal pregnancy, antepartum Vomited with GTT To return to do jelly beans for GTT  - HIV Antibody (routine testing w rflx) - RPR - CBC - Hemoglobin A1c Counseled regarding risks/benefits of flu vaccine, patient accepts vaccine.   2. Subclinical hyperthyroidism - TSH - T3 - T4   Preterm labor symptoms and general obstetric precautions including but not limited to vaginal  bleeding, contractions, leaking of fluid and fetal movement were reviewed in detail with the patient. Please refer to After Visit Summary for other counseling recommendations.   Return in about 1 week (around 11/05/2018) for high OB.  Future Appointments  Date Time Provider [place]  11/02/2018  8:45 AM [facility] None    Sloan Leiter, MD

## 2018-10-30 LAB — CBC
Hematocrit: 33.9 % — ABNORMAL LOW (ref 34.0–46.6)
Hemoglobin: 11.4 g/dL (ref 11.1–15.9)
MCH: 31.7 pg (ref 26.6–33.0)
MCHC: 33.6 g/dL (ref 31.5–35.7)
MCV: 94 fL (ref 79–97)
Platelets: 174 10*3/uL (ref 150–450)
RBC: 3.6 x10E6/uL — ABNORMAL LOW (ref 3.77–5.28)
RDW: 13.1 % (ref 11.7–15.4)
WBC: 10.8 10*3/uL (ref 3.4–10.8)

## 2018-10-30 LAB — HEMOGLOBIN A1C
Est. average glucose Bld gHb Est-mCnc: 91 mg/dL
Hgb A1c MFr Bld: 4.8 % (ref 4.8–5.6)

## 2018-10-30 LAB — TSH: TSH: 1.5 u[IU]/mL (ref 0.450–4.500)

## 2018-10-30 LAB — T3: T3, Total: 167 ng/dL (ref 71–180)

## 2018-10-30 LAB — HIV ANTIBODY (ROUTINE TESTING W REFLEX): HIV Screen 4th Generation wRfx: NONREACTIVE

## 2018-10-30 LAB — T4: T4, Total: 9.8 ug/dL (ref 4.5–12.0)

## 2018-10-30 LAB — GLUCOSE, FASTING: Glucose, Plasma: 76 mg/dL (ref 65–99)

## 2018-10-30 LAB — RPR: RPR Ser Ql: NONREACTIVE

## 2018-11-02 ENCOUNTER — Other Ambulatory Visit: Payer: Self-pay

## 2018-11-02 ENCOUNTER — Other Ambulatory Visit: Payer: PRIVATE HEALTH INSURANCE

## 2018-11-02 DIAGNOSIS — Z3480 Encounter for supervision of other normal pregnancy, unspecified trimester: Secondary | ICD-10-CM

## 2018-11-03 LAB — GLUCOSE TOLERANCE, 2 HOURS W/ 1HR
Glucose, 1 hour: 88 mg/dL (ref 65–179)
Glucose, 2 hour: 74 mg/dL (ref 65–152)
Glucose, Fasting: 72 mg/dL (ref 65–91)

## 2018-11-05 ENCOUNTER — Ambulatory Visit (INDEPENDENT_AMBULATORY_CARE_PROVIDER_SITE_OTHER): Payer: PRIVATE HEALTH INSURANCE | Admitting: Obstetrics & Gynecology

## 2018-11-05 ENCOUNTER — Other Ambulatory Visit: Payer: Self-pay

## 2018-11-05 DIAGNOSIS — Z3A26 26 weeks gestation of pregnancy: Secondary | ICD-10-CM

## 2018-11-05 DIAGNOSIS — Z3482 Encounter for supervision of other normal pregnancy, second trimester: Secondary | ICD-10-CM

## 2018-11-05 DIAGNOSIS — Z3480 Encounter for supervision of other normal pregnancy, unspecified trimester: Secondary | ICD-10-CM

## 2018-11-05 NOTE — Progress Notes (Signed)
   PRENATAL VISIT NOTE  Subjective:  Megan Schneider is a 28 y.o. G2P1001 at [redacted]w[redacted]d being seen today for ongoing prenatal care.  She is currently monitored for the following issues for this low-risk pregnancy and has Morning sickness; Encounter for supervision of normal pregnancy, antepartum; Low TSH level; Subclinical hyperthyroidism; and Cannabis hyperemesis syndrome concurrent with and due to cannabis abuse ([place]) on their problem list.  Patient reports no complaints.  Contractions: Not present. Vag. Bleeding: None.  Movement: Present. Denies leaking of fluid.   The following portions of the patient's history were reviewed and updated as appropriate: allergies, current medications, past family history, past medical history, past social history, past surgical history and problem list.   Objective:   Vitals:   11/05/18 1426  BP: 134/76  Pulse: (!) 114  Weight: 171 lb 9.6 oz (77.8 kg)    Fetal Status: Fetal Heart Rate (bpm): 140 Fundal Height: 26 cm Movement: Present     General:  Alert, oriented and cooperative. Patient is in no acute distress.  Skin: Skin is warm and dry. No rash noted.   Cardiovascular: Normal heart rate noted  Respiratory: Normal respiratory effort, no problems with respiration noted  Abdomen: Soft, gravid, appropriate for gestational age.  Pain/Pressure: Absent     Pelvic: Cervical exam deferred        Extremities: Normal range of motion.  Edema: None  Mental Status: Normal mood and affect. Normal behavior. Normal judgment and thought content.    Results for orders placed or performed in visit on 11/02/18 (from the past 168 hour(s))  Glucose Tolerance, 2 Hours w/1 Hour   Collection Time: 11/02/18  9:09 AM  Result Value Ref Range   Glucose, Fasting 72 65 - 91 mg/dL   Glucose, 1 hour 88 65 - 179 mg/dL   Glucose, 2 hour 74 65 - 152 mg/dL    Assessment and Plan:  Pregnancy: G2P1001 at [redacted]w[redacted]d 1. Supervision of other normal pregnancy, antepartum Normal GTT  results discussed with patient. No other concerns. Can resume virtual visits for now, taking BP at home weekly. Told to call for any BP >140/90.  Preterm labor symptoms and general obstetric precautions including but not limited to vaginal bleeding, contractions, leaking of fluid and fetal movement were reviewed in detail with the patient. Please refer to After Visit Summary for other counseling recommendations.   Return in about 2 weeks (around 11/19/2018) for Virtual OB Visit.  Future Appointments  Date Time Provider [place]  11/19/2018 10:30 AM Keiyon Plack, Sallyanne Havers, MD [facility] None    Verita Schneiders, MD

## 2018-11-05 NOTE — Patient Instructions (Signed)
Return to office for any scheduled appointments. Call the office or go to the MA[facility] at [facility] [place] if:  You begin to have strong, frequent contractions  Your water breaks.  Sometimes it is a big gush of fluid, sometimes it is just a trickle that keeps getting your panties wet or running down your legs  You have vaginal bleeding.  It is normal to have a small amount of spotting if your cervix was checked.   You do not feel your baby moving like normal.  If you do not, get something to eat and drink and lay down and focus on feeling your baby move.   If your baby is still not moving like normal, you should call the office or go to MAU.  Any other obstetric concerns.   

## 2018-11-19 ENCOUNTER — Encounter: Payer: PRIVATE HEALTH INSURANCE | Admitting: Advanced Practice Midwife

## 2018-11-19 ENCOUNTER — Telehealth (INDEPENDENT_AMBULATORY_CARE_PROVIDER_SITE_OTHER): Payer: PRIVATE HEALTH INSURANCE | Admitting: Obstetrics & Gynecology

## 2018-11-19 DIAGNOSIS — Z3A28 28 weeks gestation of pregnancy: Secondary | ICD-10-CM

## 2018-11-19 DIAGNOSIS — Z3480 Encounter for supervision of other normal pregnancy, unspecified trimester: Secondary | ICD-10-CM

## 2018-11-19 DIAGNOSIS — Z3483 Encounter for supervision of other normal pregnancy, third trimester: Secondary | ICD-10-CM

## 2018-11-19 NOTE — Progress Notes (Signed)
   TELEHEALTH OBSTETRICS PRENATAL VIRTUAL VIDEO VISIT ENCOUNTER NOTE  Provider location: [facility] at [place]   I connected with Megan Schneider on 11/19/18 at 10:30 AM EDT by MyChart Video Encounter at home and verified that I am speaking with the correct person using two identifiers.   I discussed the limitations, risks, security and privacy concerns of performing an evaluation and management service virtually and the availability of in person appointments. I also discussed with the patient that there may be a patient responsible charge related to this service. The patient expressed understanding and agreed to proceed. Subjective:  Megan Schneider is a 28 y.o. G2P1001 at [redacted]w[redacted]d being seen today for ongoing prenatal care.  She is currently monitored for the following issues for this low-risk pregnancy and has Morning sickness; Encounter for supervision of normal pregnancy, antepartum; Low TSH level; Subclinical hyperthyroidism; and Cannabis hyperemesis syndrome concurrent with and due to cannabis abuse ([place]) on their problem list.  Patient reports no complaints.  Contractions: Not present. Vag. Bleeding: None.  Movement: Present. Denies any leaking of fluid.   The following portions of the patient's history were reviewed and updated as appropriate: allergies, current medications, past family history, past medical history, past social history, past surgical history and problem list.   Objective:   Vitals:   11/19/18 1053  BP: 134/75  Pulse: 75    Fetal Status:     Movement: Present     General:  Alert, oriented and cooperative. Patient is in no acute distress.  Respiratory: Normal respiratory effort, no problems with respiration noted  Mental Status: Normal mood and affect. Normal behavior. Normal judgment and thought content.  Rest of physical exam deferred due to type of encounter  Imaging: No results found.  Assessment and Plan:  Pregnancy: G2P1001 at [redacted]w[redacted]d  1. Supervision of other normal pregnancy, antepartum Discussed immediate PP LARCs  In detail, patient will think about this. Otherwise, no other concerns. Preterm labor symptoms and general obstetric precautions including but not limited to vaginal bleeding, contractions, leaking of fluid and fetal movement were reviewed in detail with the patient. I discussed the assessment and treatment plan with the patient. The patient was provided an opportunity to ask questions and all were answered. The patient agreed with the plan and demonstrated an understanding of the instructions. The patient was advised to call back or seek an in-person office evaluation/go to MAU at [facility] for any urgent or concerning symptoms. Please refer to After Visit Summary for other counseling recommendations.   I provided 10 minutes of face-to-face time during this encounter.  Return in about 2 weeks (around 12/03/2018) for Virtual OB Visit.  Needs visits every 2 weeks until around 36 weeks (in person visit), then weekly..  No future appointments.  Verita Schneiders, MD [facility], [place]

## 2018-11-19 NOTE — Patient Instructions (Signed)
Return to office for any scheduled appointments. Call the office or go to the MAU at [facility] [place] if:  You begin to have strong, frequent contractions  Your water breaks.  Sometimes it is a big gush of fluid, sometimes it is just a trickle that keeps getting your panties wet or running down your legs  You have vaginal bleeding.  It is normal to have a small amount of spotting if your cervix was checked.   You do not feel your baby moving like normal.  If you do not, get something to eat and drink and lay down and focus on feeling your baby move.   If your baby is still not moving like normal, you should call the office or go to MAU.  Any other obstetric concerns.   Third Trimester of Pregnancy The third trimester is from week 28 through week 40 (months 7 through 9). The third trimester is a time when the unborn baby (fetus) is growing rapidly. At the end of the ninth month, the fetus is about 20 inches in length and weighs 6-10 pounds. Body changes during your third trimester Your body will continue to go through many changes during pregnancy. The changes vary from woman to woman. During the third trimester:  Your weight will continue to increase. You can expect to gain 25-35 pounds (11-16 kg) by the end of the pregnancy.  You may begin to get stretch marks on your hips, abdomen, and breasts.  You may urinate more often because the fetus is moving lower into your pelvis and pressing on your bladder.  You may develop or continue to have heartburn. This is caused by increased hormones that slow down muscles in the digestive tract.  You may develop or continue to have constipation because increased hormones slow digestion and cause the muscles that push waste through your intestines to relax.  You may develop hemorrhoids. These are swollen veins (varicose veins) in the rectum that can itch or be painful.  You may develop swollen, bulging veins (varicose veins)  in your legs.  You may have increased body aches in the pelvis, back, or thighs. This is due to weight gain and increased hormones that are relaxing your joints.  You may have changes in your hair. These can include thickening of your hair, rapid growth, and changes in texture. Some women also have hair loss during or after pregnancy, or hair that feels dry or thin. Your hair will most likely return to normal after your baby is born.  Your breasts will continue to grow and they will continue to become tender. A yellow fluid (colostrum) may leak from your breasts. This is the first milk you are producing for your baby.  Your belly button may stick out.  You may notice more swelling in your hands, face, or ankles.  You may have increased tingling or numbness in your hands, arms, and legs. The skin on your belly may also feel numb.  You may feel short of breath because of your expanding uterus.  You may have more problems sleeping. This can be caused by the size of your belly, increased need to urinate, and an increase in your body's metabolism.  You may notice the fetus "dropping," or moving lower in your abdomen (lightening).  You may have increased vaginal discharge.  You may notice your joints feel loose and you may have pain around your pelvic bone. What to expect at prenatal visits You will have   prenatal exams every 2 weeks until week 36. Then you will have weekly prenatal exams. During a routine prenatal visit:  You will be weighed to make sure you and the baby are growing normally.  Your blood pressure will be taken.  Your abdomen will be measured to track your baby's growth.  The fetal heartbeat will be listened to.  Any test results from the previous visit will be discussed.  You may have a cervical check near your due date to see if your cervix has softened or thinned (effaced).  You will be tested for Group B streptococcus. This happens between 35 and 37 weeks. Your  health care provider may ask you:  What your birth plan is.  How you are feeling.  If you are feeling the baby move.  If you have had any abnormal symptoms, such as leaking fluid, bleeding, severe headaches, or abdominal cramping.  If you are using any tobacco products, including cigarettes, chewing tobacco, and electronic cigarettes.  If you have any questions. Other tests or screenings that may be performed during your third trimester include:  Blood tests that check for low iron levels (anemia).  Fetal testing to check the health, activity level, and growth of the fetus. Testing is done if you have certain medical conditions or if there are problems during the pregnancy.  Nonstress test (NST). This test checks the health of your baby to make sure there are no signs of problems, such as the baby not getting enough oxygen. During this test, a belt is placed around your belly. The baby is made to move, and its heart rate is monitored during movement. What is false labor? False labor is a condition in which you feel small, irregular tightenings of the muscles in the womb (contractions) that usually go away with rest, changing position, or drinking water. These are called Braxton Hicks contractions. Contractions may last for hours, days, or even weeks before true labor sets in. If contractions come at regular intervals, become more frequent, increase in intensity, or become painful, you should see your health care provider. What are the signs of labor?  Abdominal cramps.  Regular contractions that start at 10 minutes apart and become stronger and more frequent with time.  Contractions that start on the top of the uterus and spread down to the lower abdomen and back.  Increased pelvic pressure and dull back pain.  A watery or bloody mucus discharge that comes from the vagina.  Leaking of amniotic fluid. This is also known as your "water breaking." It could be a slow trickle or a gush.  Let your health care provider know if it has a color or strange odor. If you have any of these signs, call your health care provider right away, even if it is before your due date. Follow these instructions at home: Medicines  Follow your health care provider's instructions regarding medicine use. Specific medicines may be either safe or unsafe to take during pregnancy.  Take a prenatal vitamin that contains at least 600 micrograms (mcg) of folic acid.  If you develop constipation, try taking a stool softener if your health care provider approves. Eating and drinking   Eat a balanced diet that includes fresh fruits and vegetables, whole grains, good sources of protein such as meat, eggs, or tofu, and low-fat dairy. Your health care provider will help you determine the amount of weight gain that is right for you.  Avoid raw meat and uncooked cheese. These carry germs that   can cause birth defects in the baby.  If you have low calcium intake from food, talk to your health care provider about whether you should take a daily calcium supplement.  Eat four or five small meals rather than three large meals a day.  Limit foods that are high in fat and processed sugars, such as fried and sweet foods.  To prevent constipation: ? Drink enough fluid to keep your urine clear or pale yellow. ? Eat foods that are high in fiber, such as fresh fruits and vegetables, whole grains, and beans. Activity  Exercise only as directed by your health care provider. Most women can continue their usual exercise routine during pregnancy. Try to exercise for 30 minutes at least 5 days a week. Stop exercising if you experience uterine contractions.  Avoid heavy lifting.  Do not exercise in extreme heat or humidity, or at high altitudes.  Wear low-heel, comfortable shoes.  Practice good posture.  You may continue to have sex unless your health care provider tells you otherwise. Relieving pain and discomfort   Take frequent breaks and rest with your legs elevated if you have leg cramps or low back pain.  Take warm sitz baths to soothe any pain or discomfort caused by hemorrhoids. Use hemorrhoid cream if your health care provider approves.  Wear a good support bra to prevent discomfort from breast tenderness.  If you develop varicose veins: ? Wear support pantyhose or compression stockings as told by your healthcare provider. ? Elevate your feet for 15 minutes, 3-4 times a day. Prenatal care  Write down your questions. Take them to your prenatal visits.  Keep all your prenatal visits as told by your health care provider. This is important. Safety  Wear your seat belt at all times when driving.  Make a list of emergency phone numbers, including numbers for family, friends, the hospital, and police and fire departments. General instructions  Avoid cat litter boxes and soil used by cats. These carry germs that can cause birth defects in the baby. If you have a cat, ask someone to clean the litter box for you.  Do not travel far distances unless it is absolutely necessary and only with the approval of your health care provider.  Do not use hot tubs, steam rooms, or saunas.  Do not drink alcohol.  Do not use any products that contain nicotine or tobacco, such as cigarettes and e-cigarettes. If you need help quitting, ask your health care provider.  Do not use any medicinal herbs or unprescribed drugs. These chemicals affect the formation and growth of the baby.  Do not douche or use tampons or scented sanitary pads.  Do not cross your legs for long periods of time.  To prepare for the arrival of your baby: ? Take prenatal classes to understand, practice, and ask questions about labor and delivery. ? Make a trial run to the hospital. ? Visit the hospital and tour the maternity area. ? Arrange for maternity or paternity leave through employers. ? Arrange for family and friends to take  care of pets while you are in the hospital. ? Purchase a rear-facing car seat and make sure you know how to install it in your car. ? Pack your hospital bag. ? Prepare the baby's nursery. Make sure to remove all pillows and stuffed animals from the baby's crib to prevent suffocation.  Visit your dentist if you have not gone during your pregnancy. Use a soft toothbrush to brush your teeth and be   gentle when you floss. Contact a health care provider if:  You are unsure if you are in labor or if your water has broken.  You become dizzy.  You have mild pelvic cramps, pelvic pressure, or nagging pain in your abdominal area.  You have lower back pain.  You have persistent nausea, vomiting, or diarrhea.  You have an unusual or bad smelling vaginal discharge.  You have pain when you urinate. Get help right away if:  Your water breaks before 37 weeks.  You have regular contractions less than 5 minutes apart before 37 weeks.  You have a fever.  You are leaking fluid from your vagina.  You have spotting or bleeding from your vagina.  You have severe abdominal pain or cramping.  You have rapid weight loss or weight gain.  You have shortness of breath with chest pain.  You notice sudden or extreme swelling of your face, hands, ankles, feet, or legs.  Your baby makes fewer than 10 movements in 2 hours.  You have severe headaches that do not go away when you take medicine.  You have vision changes. Summary  The third trimester is from week 28 through week 40, months 7 through 9. The third trimester is a time when the unborn baby (fetus) is growing rapidly.  During the third trimester, your discomfort may increase as you and your baby continue to gain weight. You may have abdominal, leg, and back pain, sleeping problems, and an increased need to urinate.  During the third trimester your breasts will keep growing and they will continue to become tender. A yellow fluid (colostrum)  may leak from your breasts. This is the first milk you are producing for your baby.  False labor is a condition in which you feel small, irregular tightenings of the muscles in the womb (contractions) that eventually go away. These are called Braxton Hicks contractions. Contractions may last for hours, days, or even weeks before true labor sets in.  Signs of labor can include: abdominal cramps; regular contractions that start at 10 minutes apart and become stronger and more frequent with time; watery or bloody mucus discharge that comes from the vagina; increased pelvic pressure and dull back pain; and leaking of amniotic fluid. This information is not intended to replace advice given to you by your health care provider. Make sure you discuss any questions you have with your health care provider. Document Released: 01/25/2001 Document Revised: 05/24/2018 Document Reviewed: 03/08/2016 Elsevier Patient Education  2020 [employer].

## 2018-12-03 ENCOUNTER — Encounter: Payer: Self-pay | Admitting: Obstetrics and Gynecology

## 2018-12-03 ENCOUNTER — Other Ambulatory Visit: Payer: Self-pay

## 2018-12-03 ENCOUNTER — Telehealth (INDEPENDENT_AMBULATORY_CARE_PROVIDER_SITE_OTHER): Payer: PRIVATE HEALTH INSURANCE | Admitting: Obstetrics and Gynecology

## 2018-12-03 VITALS — BP 144/95 | HR 85

## 2018-12-03 DIAGNOSIS — Z3A30 30 weeks gestation of pregnancy: Secondary | ICD-10-CM

## 2018-12-03 DIAGNOSIS — O99323 Drug use complicating pregnancy, third trimester: Secondary | ICD-10-CM

## 2018-12-03 DIAGNOSIS — F12188 Cannabis abuse with other cannabis-induced disorder: Secondary | ICD-10-CM

## 2018-12-03 DIAGNOSIS — Z3480 Encounter for supervision of other normal pregnancy, unspecified trimester: Secondary | ICD-10-CM

## 2018-12-03 DIAGNOSIS — O210 Mild hyperemesis gravidarum: Secondary | ICD-10-CM

## 2018-12-03 NOTE — Progress Notes (Signed)
I connected with  Megan Schneider on 12/03/18 by a video enabled telemedicine application and verified that I am speaking with the correct person using two identifiers.   MyChart HOB.  C/o headaches 8/10 x 2 weeks.

## 2018-12-03 NOTE — Progress Notes (Signed)
   TELEHEALTH OBSTETRICS PRENATAL VIRTUAL VIDEO VISIT ENCOUNTER NOTE  Provider location: [facility] at [place]   I connected with Megan Schneider on 12/03/18 at  8:45 AM EDT by MyChart Video Encounter at home and verified that I am speaking with the correct person using two identifiers.   I discussed the limitations, risks, security and privacy concerns of performing an evaluation and management service virtually and the availability of in person appointments. I also discussed with the patient that there may be a patient responsible charge related to this service. The patient expressed understanding and agreed to proceed. Subjective:  Megan Schneider is a 28 y.o. G2P1001 at [redacted]w[redacted]d being seen today for ongoing prenatal care.  She is currently monitored for the following issues for this low-risk pregnancy and has Morning sickness; Encounter for supervision of normal pregnancy, antepartum; Low TSH level; Subclinical hyperthyroidism; and Cannabis hyperemesis syndrome concurrent with and due to cannabis abuse ([place]) on their problem list.  Patient reports occasional headaches controlled with tylenol.  Contractions: Not present. Vag. Bleeding: None.  Movement: Present. Denies any leaking of fluid.   The following portions of the patient's history were reviewed and updated as appropriate: allergies, current medications, past family history, past medical history, past social history, past surgical history and problem list.   Objective:   Vitals:   12/03/18 0852  BP: (!) 144/95  Pulse: 85    Fetal Status:     Movement: Present     General:  Alert, oriented and cooperative. Patient is in no acute distress.  Respiratory: Normal respiratory effort, no problems with respiration noted  Mental Status: Normal mood and affect. Normal behavior. Normal judgment and thought content.  Rest of physical exam deferred due to type of encounter  Imaging: No results found.  Assessment and  Plan:  Pregnancy: G2P1001 at [redacted]w[redacted]d 1. Supervision of other normal pregnancy, antepartum Patient is doing well Reviewed si/sx of preeclampsia. Patient considering contraceptive patch   Preterm labor symptoms and general obstetric precautions including but not limited to vaginal bleeding, contractions, leaking of fluid and fetal movement were reviewed in detail with the patient. I discussed the assessment and treatment plan with the patient. The patient was provided an opportunity to ask questions and all were answered. The patient agreed with the plan and demonstrated an understanding of the instructions. The patient was advised to call back or seek an in-person office evaluation/go to MAU at [facility] for any urgent or concerning symptoms. Please refer to After Visit Summary for other counseling recommendations.   I provided 11 minutes of face-to-face time during this encounter.  Return in about 2 weeks (around 12/17/2018) for in person, ROB, Low risk.  No future appointments.  Mora Bellman, MD [facility]

## 2018-12-17 ENCOUNTER — Other Ambulatory Visit: Payer: Self-pay

## 2018-12-17 ENCOUNTER — Encounter: Payer: Self-pay | Admitting: Advanced Practice Midwife

## 2018-12-17 ENCOUNTER — Ambulatory Visit (INDEPENDENT_AMBULATORY_CARE_PROVIDER_SITE_OTHER): Payer: Medicaid Other | Admitting: Advanced Practice Midwife

## 2018-12-17 DIAGNOSIS — Z3483 Encounter for supervision of other normal pregnancy, third trimester: Secondary | ICD-10-CM

## 2018-12-17 DIAGNOSIS — Z3A32 32 weeks gestation of pregnancy: Secondary | ICD-10-CM

## 2018-12-17 DIAGNOSIS — Z3480 Encounter for supervision of other normal pregnancy, unspecified trimester: Secondary | ICD-10-CM

## 2018-12-17 NOTE — Progress Notes (Signed)
ROB   CC: None   *Pt consents to student being in exam room

## 2018-12-17 NOTE — Progress Notes (Signed)
   PRENATAL VISIT NOTE  Subjective:  Megan Schneider is a 28 y.o. G2P1001 at [redacted]w[redacted]d being seen today for ongoing prenatal care.  She is currently monitored for the following issues for this low-risk pregnancy and has Morning sickness; Encounter for supervision of normal pregnancy, antepartum; Low TSH level; Subclinical hyperthyroidism; and Cannabis hyperemesis syndrome concurrent with and due to cannabis abuse ([place]) on their problem list.  Patient reports no complaints.  Contractions: Not present. Vag. Bleeding: None.  Movement: Present. Denies leaking of fluid.   The following portions of the patient's history were reviewed and updated as appropriate: allergies, current medications, past family history, past medical history, past social history, past surgical history and problem list.   Objective:   Vitals:   12/17/18 1105  BP: 134/77  Pulse: 91  Weight: 81.2 kg    Fetal Status: Fetal Heart Rate (bpm): 150 Fundal Height: 33 cm Movement: Present     General:  Alert, oriented and cooperative. Patient is in no acute distress.  Skin: Skin is warm and dry. No rash noted.   Cardiovascular: Normal heart rate noted  Respiratory: Normal respiratory effort, no problems with respiration noted  Abdomen: Soft, gravid, appropriate for gestational age.  Pain/Pressure: Absent     Pelvic: Cervical exam deferred        Extremities: Normal range of motion.  Edema: None  Mental Status: Normal mood and affect. Normal behavior. Normal judgment and thought content.   Assessment and Plan:  Pregnancy: G2P1001 at [redacted]w[redacted]d 1. Supervision of other normal pregnancy, antepartum Patient is doing well.  Fundal height measured at 33cm.   Preterm labor symptoms and general obstetric precautions including but not limited to vaginal bleeding, contractions, leaking of fluid and fetal movement were reviewed in detail with the patient. Please refer to After Visit Summary for other counseling recommendations.   Return  in about 4 weeks (around 01/14/2019).  Future Appointments  Date Time Provider [place]  01/14/2019  9:55 AM [place], Kathie Dike, CNM [place] None    Dorothyann Peng, Student-PA

## 2019-01-02 DIAGNOSIS — Z3493 Encounter for supervision of normal pregnancy, unspecified, third trimester: Secondary | ICD-10-CM

## 2019-01-14 ENCOUNTER — Other Ambulatory Visit (HOSPITAL_COMMUNITY)
Admission: RE | Admit: 2019-01-14 | Discharge: 2019-01-14 | Disposition: A | Discharge status: Home / Self Care | Payer: PRIVATE HEALTH INSURANCE | Source: Ambulatory Visit | Attending: Advanced Practice Midwife | Admitting: Advanced Practice Midwife

## 2019-01-14 ENCOUNTER — Ambulatory Visit (INDEPENDENT_AMBULATORY_CARE_PROVIDER_SITE_OTHER): Payer: Medicaid Other | Admitting: Advanced Practice Midwife

## 2019-01-14 ENCOUNTER — Other Ambulatory Visit: Payer: Self-pay

## 2019-01-14 VITALS — BP 119/75 | HR 82 | Wt 186.0 lb

## 2019-01-14 DIAGNOSIS — O99323 Drug use complicating pregnancy, third trimester: Secondary | ICD-10-CM

## 2019-01-14 DIAGNOSIS — K117 Disturbances of salivary secretion: Secondary | ICD-10-CM

## 2019-01-14 DIAGNOSIS — O99283 Endocrine, nutritional and metabolic diseases complicating pregnancy, third trimester: Secondary | ICD-10-CM

## 2019-01-14 DIAGNOSIS — Z3480 Encounter for supervision of other normal pregnancy, unspecified trimester: Secondary | ICD-10-CM | POA: Insufficient documentation

## 2019-01-14 DIAGNOSIS — F12188 Cannabis abuse with other cannabis-induced disorder: Secondary | ICD-10-CM

## 2019-01-14 DIAGNOSIS — E0590 Thyrotoxicosis, unspecified without thyrotoxic crisis or storm: Secondary | ICD-10-CM

## 2019-01-14 DIAGNOSIS — Z3A36 36 weeks gestation of pregnancy: Secondary | ICD-10-CM

## 2019-01-14 NOTE — Progress Notes (Signed)
   PRENATAL VISIT NOTE  Subjective:  Megan Schneider is a 28 y.o. G2P1001 at [redacted]w[redacted]d being seen today for ongoing prenatal care.  She is currently monitored for the following issues for this low-risk pregnancy and has Morning sickness; Encounter for supervision of normal pregnancy, antepartum; Low TSH level; Subclinical hyperthyroidism; and Cannabis hyperemesis syndrome concurrent with and due to cannabis abuse ([place]) on their problem list.  Patient reports no complaints other than mild sialorrhea. She has been taking PO Robinul without relief. She reports minimal complaints with it and reports she does not need any Tx at this time. Nausea and vomiting have subsided and she has not experienced any during the third trimester so far.  Contractions: Not present. Vag. Bleeding: None.  Movement: Present. Denies leaking of fluid.  Patient reports she has been checking her BP at home daily and the readings have been 120's/60-70's. Patient denies any HA, chest pain, swelling, or visual changes.   The following portions of the patient's history were reviewed and updated as appropriate: allergies, current medications, past family history, past medical history, past social history, past surgical history and problem list.    Objective:   Vitals:   01/14/19 1002  BP: 119/75  Pulse: 82  Weight: 84.4 kg    Fetal Status: Fetal Heart Rate (bpm): 160 Fundal Height: 35 cm Movement: Present     General:  Alert, oriented and cooperative. Patient is in no acute distress.  Skin: Skin is warm and dry. No rash noted.   Cardiovascular: Normal heart rate noted  Respiratory: Normal respiratory effort, no problems with respiration noted  Abdomen: Soft, gravid, appropriate for gestational age.  Pain/Pressure: Absent, 35cm Fundal Height    Pelvic: Cervical exam performed      1cm, 50%, -2, vertex  Extremities: Normal range of motion.  Edema: None  Mental Status: Normal mood and affect. Normal behavior. Normal  judgment and thought content.   Assessment and Plan:  Pregnancy: G2P1001 at [redacted]w[redacted]d 1. Subclinical hyperthyroidism - Patient continues to be asymptomatic. Will check TSH, T3, and T4 today as recommended by her previous provider  2. Supervision of other normal pregnancy, antepartum - Patient doing well and FHT WNL.  - GBS sample taken today. CT/GC taken today. - Cervicovaginal ancillary only( [place])  3. Cannabis hyperemesis syndrome concurrent with and due to cannabis abuse ([place]) - Not symptomatic at this time  4. Ptyalism - Mild, patient states it is tolerable at this time and does not need any new Tx - Continue Robinul as needed  Term labor symptoms and general obstetric precautions including but not limited to vaginal bleeding, contractions, leaking of fluid and fetal movement were reviewed in detail with the patient. Please refer to After Visit Summary for other counseling recommendations.   Return in about 2 weeks (around 01/28/2019).  Future Appointments  Date Time Provider [place]  01/28/2019 10:55 AM Leftwich-Kirby, Kathie Dike, CNM [facility] None    Hessie Dibble, Student-PA

## 2019-01-14 NOTE — Progress Notes (Signed)
Pt is here for ROB. [redacted]w[redacted]d.

## 2019-01-14 NOTE — Patient Instructions (Signed)
After 37 weeks:  Try the [employer] Oil at [email or web address] daily to improve baby's position and encourage the onset of labor.   Cervical Ripening: May try one or both  Red Raspberry Leaf capsules:  two 300mg  or 400mg  tablets with each meal, 2-3 times a day  Potential Side Effects Of Raspberry Leaf:  Most women do not experience any side effects from drinking raspberry leaf tea. However, nausea and loose stools are possible     Evening Primrose Oil capsules: may take 1 to 3 capsules daily. May also prick one to release the oil and insert it into your vagina at night.  Some of the potential side effects:  Upset stomach  Loose stools or diarrhea  Headaches  Nausea

## 2019-01-15 LAB — CERVICOVAGINAL ANCILLARY ONLY
Chlamydia: NEGATIVE
Comment: NEGATIVE
Comment: NORMAL
Neisseria Gonorrhea: NEGATIVE

## 2019-01-15 LAB — T3: T3, Total: 172 ng/dL (ref 71–180)

## 2019-01-15 LAB — T4: T4, Total: 9.2 ug/dL (ref 4.5–12.0)

## 2019-01-15 LAB — TSH: TSH: 1.1 u[IU]/mL (ref 0.450–4.500)

## 2019-01-16 LAB — STREP GP B NAA: Strep Gp B NAA: NEGATIVE

## 2019-01-25 ENCOUNTER — Encounter (HOSPITAL_COMMUNITY): Payer: Self-pay | Admitting: Obstetrics and Gynecology

## 2019-01-25 ENCOUNTER — Inpatient Hospital Stay (HOSPITAL_COMMUNITY)
Admission: AD | Admit: 2019-01-25 | Discharge: 2019-01-27 | DRG: 807 | Disposition: A | Discharge status: Home / Self Care | Payer: PRIVATE HEALTH INSURANCE | Attending: Obstetrics & Gynecology | Admitting: Obstetrics & Gynecology

## 2019-01-25 ENCOUNTER — Other Ambulatory Visit: Payer: Self-pay

## 2019-01-25 DIAGNOSIS — M545 Low back pain, unspecified: Secondary | ICD-10-CM | POA: Diagnosis present

## 2019-01-25 DIAGNOSIS — O210 Mild hyperemesis gravidarum: Secondary | ICD-10-CM

## 2019-01-25 DIAGNOSIS — O99324 Drug use complicating childbirth: Secondary | ICD-10-CM | POA: Diagnosis present

## 2019-01-25 DIAGNOSIS — O134 Gestational [pregnancy-induced] hypertension without significant proteinuria, complicating childbirth: Secondary | ICD-10-CM | POA: Diagnosis present

## 2019-01-25 DIAGNOSIS — F1220 Cannabis dependence, uncomplicated: Secondary | ICD-10-CM | POA: Diagnosis not present

## 2019-01-25 DIAGNOSIS — Z20828 Contact with and (suspected) exposure to other viral communicable diseases: Secondary | ICD-10-CM | POA: Diagnosis present

## 2019-01-25 DIAGNOSIS — Z3A38 38 weeks gestation of pregnancy: Secondary | ICD-10-CM | POA: Diagnosis not present

## 2019-01-25 DIAGNOSIS — O99284 Endocrine, nutritional and metabolic diseases complicating childbirth: Secondary | ICD-10-CM | POA: Diagnosis present

## 2019-01-25 DIAGNOSIS — E0590 Thyrotoxicosis, unspecified without thyrotoxic crisis or storm: Secondary | ICD-10-CM | POA: Diagnosis present

## 2019-01-25 DIAGNOSIS — G8929 Other chronic pain: Secondary | ICD-10-CM | POA: Diagnosis present

## 2019-01-25 DIAGNOSIS — O139 Gestational [pregnancy-induced] hypertension without significant proteinuria, unspecified trimester: Secondary | ICD-10-CM

## 2019-01-25 DIAGNOSIS — F1290 Cannabis use, unspecified, uncomplicated: Secondary | ICD-10-CM | POA: Diagnosis present

## 2019-01-25 DIAGNOSIS — F12188 Cannabis abuse with other cannabis-induced disorder: Secondary | ICD-10-CM

## 2019-01-25 DIAGNOSIS — O26893 Other specified pregnancy related conditions, third trimester: Secondary | ICD-10-CM | POA: Diagnosis present

## 2019-01-25 HISTORY — DX: Gestational (pregnancy-induced) hypertension without significant proteinuria, unspecified trimester: O13.9

## 2019-01-25 LAB — CBC
HCT: 33 % — ABNORMAL LOW (ref 36.0–46.0)
Hemoglobin: 11.3 g/dL — ABNORMAL LOW (ref 12.0–15.0)
MCH: 31 pg (ref 26.0–34.0)
MCHC: 34.2 g/dL (ref 30.0–36.0)
MCV: 90.7 fL (ref 80.0–100.0)
Platelets: 156 10*3/uL (ref 150–400)
RBC: 3.64 MIL/uL — ABNORMAL LOW (ref 3.87–5.11)
RDW: 12.8 % (ref 11.5–15.5)
WBC: 10.3 10*3/uL (ref 4.0–10.5)
nRBC: 0 % (ref 0.0–0.2)

## 2019-01-25 LAB — COMPREHENSIVE METABOLIC PANEL
ALT: 9 U/L (ref 0–44)
AST: 14 U/L — ABNORMAL LOW (ref 15–41)
Albumin: 2.6 g/dL — ABNORMAL LOW (ref 3.5–5.0)
Alkaline Phosphatase: 151 U/L — ABNORMAL HIGH (ref 38–126)
Anion gap: 6 (ref 5–15)
BUN: 7 mg/dL (ref 6–20)
CO2: 23 mmol/L (ref 22–32)
Calcium: 8.3 mg/dL — ABNORMAL LOW (ref 8.9–10.3)
Chloride: 109 mmol/L (ref 98–111)
Creatinine, Ser: 0.71 mg/dL (ref 0.44–1.00)
GFR calc Af Amer: >60 mL/min (ref >60)
GFR calc non Af Amer: >60 mL/min (ref >60)
Glucose, Bld: 80 mg/dL (ref 70–99)
Potassium: 3.9 mmol/L (ref 3.5–5.1)
Sodium: 138 mmol/L (ref 135–145)
Total Bilirubin: 0.8 mg/dL (ref 0.3–1.2)
Total Protein: 5.6 g/dL — ABNORMAL LOW (ref 6.5–8.1)

## 2019-01-25 LAB — AMNISURE RUPTURE OF MEMBRANE (ROM) NOT AT ARMC: Amnisure ROM: NEGATIVE

## 2019-01-25 LAB — ABO/RH: ABO/RH(D): O POS

## 2019-01-25 LAB — URINALYSIS, ROUTINE W REFLEX MICROSCOPIC
Bilirubin Urine: NEGATIVE
Glucose, UA: NEGATIVE mg/dL
Hgb urine dipstick: NEGATIVE
Ketones, ur: NEGATIVE mg/dL
Leukocytes,Ua: NEGATIVE
Nitrite: NEGATIVE
Protein, ur: NEGATIVE mg/dL
Specific Gravity, Urine: 1.024 (ref 1.005–1.030)
pH: 6 (ref 5.0–8.0)

## 2019-01-25 LAB — POCT FERN TEST: POCT Fern Test: NEGATIVE

## 2019-01-25 LAB — PROTEIN / CREATININE RATIO, URINE
Creatinine, Urine: 167.67 mg/dL
Protein Creatinine Ratio: 0.22 mg/mg{Cre} — ABNORMAL HIGH (ref 0.00–0.15)
Total Protein, Urine: 37 mg/dL

## 2019-01-25 LAB — TYPE AND SCREEN
ABO/RH(D): O POS
Antibody Screen: NEGATIVE

## 2019-01-25 LAB — SARS CORONAVIRUS 2 (TAT 6-24 HRS): SARS Coronavirus 2: NEGATIVE

## 2019-01-25 MED ORDER — FENTANYL CITRATE (PF) 100 MCG/2ML IJ SOLN
100.0000 ug | INTRAMUSCULAR | Status: DC | PRN
  Administered 2019-01-25 (×3): 100 ug via INTRAVENOUS
  Filled 2019-01-25 (×2): qty 2

## 2019-01-25 MED ORDER — EPHEDRINE 5 MG/ML INJ: 10.0000 mg | INTRAVENOUS | Status: DC | PRN

## 2019-01-25 MED ORDER — PHENYLEPHRINE 40 MCG/ML (10ML) SYRINGE FOR IV PUSH (FOR BLOOD PRESSURE SUPPORT): 80.0000 ug | PREFILLED_SYRINGE | INTRAVENOUS | Status: DC | PRN

## 2019-01-25 MED ORDER — OXYTOCIN BOLUS FROM INFUSION: 500.0000 mL | Freq: Once | INTRAVENOUS | Status: DC

## 2019-01-25 MED ORDER — MISOPROSTOL 200 MCG PO TABS
ORAL_TABLET | ORAL | Status: AC
  Filled 2019-01-25: qty 4

## 2019-01-25 MED ORDER — MORPHINE SULFATE (PF) 4 MG/ML IV SOLN
INTRAVENOUS | Status: AC
  Filled 2019-01-25: qty 1

## 2019-01-25 MED ORDER — ACETAMINOPHEN 325 MG PO TABS: 650.0000 mg | ORAL_TABLET | ORAL | Status: DC | PRN

## 2019-01-25 MED ORDER — TERBUTALINE SULFATE 1 MG/ML IJ SOLN: 0.2500 mg | Freq: Once | INTRAMUSCULAR | Status: DC | PRN

## 2019-01-25 MED ORDER — MISOPROSTOL 200 MCG PO TABS
800.0000 ug | ORAL_TABLET | Freq: Once | ORAL | Status: AC
  Administered 2019-01-25: 800 ug via RECTAL

## 2019-01-25 MED ORDER — OXYTOCIN 10 UNIT/ML IJ SOLN
INTRAMUSCULAR | Status: AC
  Administered 2019-01-25: 10 [IU]
  Filled 2019-01-25: qty 1

## 2019-01-25 MED ORDER — ONDANSETRON HCL 4 MG/2ML IJ SOLN
4.0000 mg | Freq: Four times a day (QID) | INTRAMUSCULAR | Status: DC | PRN
  Administered 2019-01-25: 4 mg via INTRAVENOUS
  Filled 2019-01-25: qty 2

## 2019-01-25 MED ORDER — MORPHINE SULFATE (PF) 4 MG/ML IV SOLN
4.0000 mg | Freq: Once | INTRAVENOUS | Status: AC
  Administered 2019-01-25: 4 mg via INTRAMUSCULAR

## 2019-01-25 MED ORDER — SOD CITRATE-CITRIC ACID 500-334 MG/5ML PO SOLN: 30.0000 mL | ORAL | Status: DC | PRN

## 2019-01-25 MED ORDER — MISOPROSTOL 50MCG HALF TABLET
50.0000 ug | ORAL_TABLET | ORAL | Status: DC | PRN
  Administered 2019-01-25: 50 ug via BUCCAL
  Filled 2019-01-25: qty 1

## 2019-01-25 MED ORDER — OXYTOCIN 40 UNITS IN NORMAL SALINE INFUSION - SIMPLE MED: 2.5000 [IU]/h | INTRAVENOUS | Status: DC

## 2019-01-25 MED ORDER — LIDOCAINE HCL (PF) 1 % IJ SOLN
30.0000 mL | INTRAMUSCULAR | Status: AC | PRN
  Administered 2019-01-25: 30 mL via SUBCUTANEOUS
  Filled 2019-01-25: qty 30

## 2019-01-25 MED ORDER — DIPHENHYDRAMINE HCL 50 MG/ML IJ SOLN: 12.5000 mg | INTRAMUSCULAR | Status: DC | PRN

## 2019-01-25 MED ORDER — FENTANYL CITRATE (PF) 100 MCG/2ML IJ SOLN
INTRAMUSCULAR | Status: AC
  Filled 2019-01-25: qty 2

## 2019-01-25 MED ORDER — CARBOPROST TROMETHAMINE 250 MCG/ML IM SOLN
INTRAMUSCULAR | Status: AC
  Filled 2019-01-25: qty 1

## 2019-01-25 MED ORDER — LACTATED RINGERS IV SOLN: 500.0000 mL | INTRAVENOUS | Status: DC | PRN

## 2019-01-25 MED ORDER — CARBOPROST TROMETHAMINE 250 MCG/ML IM SOLN
250.0000 ug | Freq: Once | INTRAMUSCULAR | Status: AC
  Administered 2019-01-25: 250 ug via INTRAMUSCULAR

## 2019-01-25 MED ORDER — MISOPROSTOL 200 MCG PO TABS
ORAL_TABLET | ORAL | Status: AC
  Filled 2019-01-25: qty 2

## 2019-01-25 MED ORDER — LACTATED RINGERS IV SOLN
INTRAVENOUS | Status: DC
  Administered 2019-01-25 (×3): via INTRAVENOUS

## 2019-01-25 MED ORDER — OXYTOCIN 40 UNITS IN NORMAL SALINE INFUSION - SIMPLE MED
1.0000 m[IU]/min | INTRAVENOUS | Status: DC
  Administered 2019-01-25: 2 m[IU]/min via INTRAVENOUS
  Filled 2019-01-25: qty 1000

## 2019-01-25 MED ORDER — FENTANYL-BUPIVACAINE-NACL 0.5-0.125-0.9 MG/250ML-% EP SOLN: 12.0000 mL/h | EPIDURAL | Status: DC | PRN

## 2019-01-25 MED ORDER — LACTATED RINGERS IV SOLN: 500.0000 mL | Freq: Once | INTRAVENOUS | Status: DC

## 2019-01-25 NOTE — H&P (Addendum)
OBSTETRIC ADMISSION HISTORY AND PHYSICAL  Megan Schneider is a 28 y.o. female G2P1001 with IUP at 22w2dby ultrasound presenting for possible rupture of membranes. She reports +FMs, No VB, no blurry vision, headaches or peripheral edema, and RUQ pain. Presented to MAU this AM for leakage of clear vaginal fluid. She states that she has had only minimal leakage since then. She plans on formula feeding. States that she had difficulty expressing milk during first pregnancy. She requests the patch for birth control. [facility]  Patient's prior pregnancy was uncomplicated. She reports that she had an AROM with active labor for 11 hrs, pushed for 4 hrs with unassisted delivery.   Dating: By U/S --->  Estimated Date of Delivery: 02/06/19  Sono:   _0 , CWD, normal anatomy, cephalic presentation, 5008QP 75% EFW, no low lying placenta noted, no placental abruption or previa, AFI wnl.  Prenatal History/Complications: - Subclinical hyperthyroidism (TSH 1.1 11/30 <-- 0.108 6 mo ago) - Cannabis hyperemesis syndrome affecting 1st through beginning of 3rd trimester of pregnancy - Gestational HTN - not on antihypertensives.   Past Medical History: Past Medical History:  Diagnosis Date  . Asthma    occasionally, exercise-induced; uses inhaler; not used "in a while"  . Marijuana use 08/24/2016   Positive tox screen in Jan 2018  . Scoliosis     Past Surgical History: Past Surgical History:  Procedure Laterality Date  . NO PAST SURGERIES      Obstetrical History: OB History    Gravida  2   Para  1   Term  1   Preterm  0   AB  0   Living  1     SAB  0   TAB  0   Ectopic  0   Multiple  0   Live Births  1           Social History [place] 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 She denies tobacco or alcohol use. Endorses marijuana use during  pregnancy. Social History   Socioeconomic History  . Marital status: Single    Spouse name: Not on file  . Number of children: Not on file  . Years of education: Not on file  . Highest education level: Not on file  Occupational History  . Not on file  Tobacco Use  . Smoking status: Never Smoker  . Smokeless tobacco: Never Used  Substance and Sexual Activity  . Alcohol use: No    Alcohol/week: 0.0 standard drinks  . Drug use: Not Currently    Types: Marijuana    Comment: reported last time was when "[redacted] weeks pregnant"  . Sexual activity: Yes    Birth control/protection: None  Other Topics Concern  . Not on file  Social History Narrative  . Not on file   Social Determinants of Health   Financial Resource Strain:   . Difficulty of Paying Living Expenses: Not on file  Food Insecurity:   . Worried About RCharity fundraiserin the Last Year: Not on file  . Ran Out of Food in the Last Year: Not on file  Transportation Needs:   . Lack of Transportation (Medical): Not on file  . Lack of Transportation (Non-Medical): Not on file  Physical Activity:   . Days of Exercise per Week: Not on file  . Minutes of Exercise per Session: Not on file  Stress:   . Feeling of Stress : Not on file  Social Connections:   . Frequency of Communication with Friends and Family: Not on file  . Frequency of Social Gatherings with Friends and Family: Not on file  . Attends Religious Services: Not on file  . Active Member of Clubs or Organizations: Not on file  . Attends Archivist Meetings: Not on file  . Marital Status: Not on file    Family History: Family History  Problem Relation Age of Onset  . Diabetes Father     Allergies: No Known Allergies  Medications Prior to Admission  Medication Sig Dispense Refill Last Dose  . Prenatal Multivit-Min-Fe-FA (PRENATAL VITAMINS) 0.8 MG tablet Take 1 tablet by mouth daily. 30 tablet 12 01/24/2019 at Unknown time  . Blood Pressure KIT  Z34.90 To monitored regularly at home Large 1 each 0      Review of Systems   All systems reviewed and negative except as stated in HPI  Blood pressure (!) 138/92, pulse 68, temperature (!) 97.5 F (36.4 C), temperature source Oral, resp. rate 20, height _0  (1.702 m), weight 88.3 kg, last menstrual period 04/23/2018, SpO2 100 %. General appearance: alert, well-appearing, no apparent distress Lungs: clear to auscultation bilaterally Heart: regular rate and rhythm Abdomen: soft, non-tender; bowel sounds normal Extremities: Homans sign is negative, no sign of DVT Presentation: cephalic Fetal monitoring: External, FHR 115, moderate variability, 15x15 accelerations, no decelerations Uterine activity: none via toco    Prenatal labs: ABO, Rh: --/--/O POS (12/11 1015) Antibody: NEG (12/11 1015) Rubella: 3.43 (05/21 1509) RPR: Non Reactive (09/14 0952)  HBsAg: Negative (05/21 1509)  HIV: Non Reactive (09/14 0233)  GBS: --[place] Cloud (11/30 1037)  2 hr GTT: 72/88/74 Genetic screening: negative AFP, normal Horizon Actuary [place]: normal  Prenatal Transfer Tool  Maternal Diabetes: No Genetic Screening: Negative AFP screen, normal Horizon screen Maternal Ultrasounds/Referrals: Normal Fetal Ultrasounds or other Referrals:  None, Referred to Materal Fetal Medicine  Maternal Substance Abuse:  Yes:  Type: Marijuana Significant Maternal Medications:  None Significant Maternal Lab Results: None  Results for orders placed or performed during the hospital encounter of 01/25/19 (from the past 24 hour(s))  Urinalysis, Routine w reflex microscopic   Collection Time: 01/25/19  9:04 AM  Result Value Ref Range   Color, Urine YELLOW YELLOW   APPearance CLEAR CLEAR   Specific Gravity, Urine 1.024 1.005 - 1.030   pH 6.0 5.0 - 8.0   Glucose, UA NEGATIVE NEGATIVE mg/dL   Hgb urine dipstick NEGATIVE NEGATIVE   Bilirubin Urine NEGATIVE NEGATIVE   Ketones, ur NEGATIVE NEGATIVE mg/dL   Protein,  ur NEGATIVE NEGATIVE mg/dL   Nitrite NEGATIVE NEGATIVE   Leukocytes,Ua NEGATIVE NEGATIVE  Protein / creatinine ratio, urine   Collection Time: 01/25/19  9:04 AM  Result Value Ref Range   Creatinine, Urine 167.67 mg/dL   Total Protein, Urine 37 mg/dL   Protein Creatinine Ratio 0.22 (H) 0.00 - 0.15 mg/mg[Cre]  POCT fern test   Collection Time: 01/25/19  9:20 AM  Result Value Ref Range   POCT Fern Test Negative = intact amniotic membranes   Amnisure rupture of membrane (rom)not at [facility]   Collection Time: 01/25/19  9:43 AM  Result Value Ref Range   Amnisure ROM NEGATIVE   CBC   Collection Time: 01/25/19 10:15 AM  Result Value Ref Range   WBC 10.3 4.0 - 10.5 K/uL   RBC 3.64 (L) 3.87 - 5.11 MIL/uL  Hemoglobin 11.3 (L) 12.0 - 15.0 g/dL   HCT 33.0 (L) 36.0 - 46.0 %   MCV 90.7 80.0 - 100.0 fL   MCH 31.0 26.0 - 34.0 pg   MCHC 34.2 30.0 - 36.0 g/dL   RDW 12.8 11.5 - 15.5 %   Platelets 156 150 - 400 K/uL   nRBC 0.0 0.0 - 0.2 %  Comprehensive metabolic panel   Collection Time: 01/25/19 10:15 AM  Result Value Ref Range   Sodium 138 135 - 145 mmol/L   Potassium 3.9 3.5 - 5.1 mmol/L   Chloride 109 98 - 111 mmol/L   CO2 23 22 - 32 mmol/L   Glucose, Bld 80 70 - 99 mg/dL   BUN 7 6 - 20 mg/dL   Creatinine, Ser 0.71 0.44 - 1.00 mg/dL   Calcium 8.3 (L) 8.9 - 10.3 mg/dL   Total Protein 5.6 (L) 6.5 - 8.1 g/dL   Albumin 2.6 (L) 3.5 - 5.0 g/dL   AST 14 (L) 15 - 41 U/L   ALT 9 0 - 44 U/L   Alkaline Phosphatase 151 (H) 38 - 126 U/L   Total Bilirubin 0.8 0.3 - 1.2 mg/dL   GFR calc non Af Amer >60 >60 mL/min   GFR calc Af Amer >60 >60 mL/min   Anion gap 6 5 - 15  Type and screen [place]   Collection Time: 01/25/19 10:15 AM  Result Value Ref Range   ABO/RH(D) O POS    Antibody Screen NEG    Sample Expiration      01/28/2019,2359 Performed at [facility], [address], [place], [place] [address]     Patient Active Problem List   Diagnosis Date Noted  .  Chronic bilateral low back pain without sciatica 01/25/2019  . Gestational hypertension 01/25/2019  . [redacted] weeks gestation of pregnancy 01/25/2019  . Cannabis hyperemesis syndrome concurrent with and due to cannabis abuse ([place]) 08/02/2018  . Subclinical hyperthyroidism 07/14/2018  . Low TSH level 07/06/2018  . Encounter for supervision of normal pregnancy, antepartum 07/05/2018  . Morning sickness 06/22/2018    Assessment/Plan:  Megan Schneider is a 28 y.o. G2P1001 at 62w2dhere for possible onset of labor. She is stabl  1. Labor: No contractions at present. Stable and normal results of external fetal heart monitoring. 2. FWB:Cat I 3. Pain: Epidural as needed 4. GMEB:RAXENMMH5. Rh positive  Plan - Admit to L&D - Expectant Management. Consider Cytotec and AROM as appropriate.  - Anticipate vaginal delivery - Regular diet - Gestational HTN: Monitor BP. Normal PEC labs this AM. BP 131/85. Repeat PEC labs as needed.   NCristela Felt Medical Student  01/25/2019, 12:51 PM  I saw and evaluated the patient. I agree with the findings and the plan of care as documented in the student's note. IOL for gHTN; Pre-E labs normal. S/p Cytotec x1 and foley bulb placed now. EFW 3600g.  CBarrington Ellison MD OSum[facility] LPFamily Medicine Fellow, F[facility] LP[facility] Healthcarefor WDean Foods [employer] CSt. Edward

## 2019-01-25 NOTE — MAU Provider Note (Signed)
Chief Complaint  Patient presents with   Rupture of Membranes     None     S: Megan Schneider  is a 28 y.o. y.o. year old G68P1001 female at [redacted]w[redacted]d weeks gestation who presents to MAU for r/o SROM & had elevated blood pressures. Denies Hx of hypertension. Current blood pressure medication: n/a   Associated symptoms: denies Headache, denies vision changes, denies epigastric pain Contractions: denies Vaginal bleeding: denies  Fetal movement: good  O:  Patient Vitals for the past 24 hrs:  BP Temp Temp src Pulse Resp SpO2 Height Weight  01/25/19 0946 129/76 -- -- 65 -- 99 % -- --  01/25/19 0934 129/80 -- -- 71 -- -- -- --  01/25/19 0900 135/80 -- -- 70 -- 99 % -- --  01/25/19 0839 (!) 144/74 98.4 F (36.9 C) Oral 69 16 100 % 5\' 7"  (1.702 m) 88.3 kg   General: NAD Heart: Regular rate Lungs: Normal rate and effort Abd: Soft, NT, Gravid, S=D Extremities: non pittingPedal edema Neuro: 2+ deep tendon reflexes, No clonus   NST:  Baseline: 125 bpm, Variability: Good {> 6 bpm), Accelerations: Reactive and Decelerations: Absent  Results for orders placed or performed during the hospital encounter of 01/25/19 (from the past 24 hour(s))  Urinalysis, Routine w reflex microscopic     Status: None   Collection Time: 01/25/19  9:04 AM  Result Value Ref Range   Color, Urine YELLOW YELLOW   APPearance CLEAR CLEAR   Specific Gravity, Urine 1.024 1.005 - 1.030   pH 6.0 5.0 - 8.0   Glucose, UA NEGATIVE NEGATIVE mg/dL   Hgb urine dipstick NEGATIVE NEGATIVE   Bilirubin Urine NEGATIVE NEGATIVE   Ketones, ur NEGATIVE NEGATIVE mg/dL   Protein, ur NEGATIVE NEGATIVE mg/dL   Nitrite NEGATIVE NEGATIVE   Leukocytes,Ua NEGATIVE NEGATIVE  POCT fern test     Status: None   Collection Time: 01/25/19  9:20 AM  Result Value Ref Range   POCT Fern Test Negative = intact amniotic membranes    MDM Inconclusive fern slide, amnisure ordered and is negative  Intake BP today is elevated. Per review of  Epic, pt had an elevated BP at 30 wks and again on 12/9 via [place]. PEC labs pending. Will admit for GHTN.   A:  Gestational hypertension in third trimester 38 week IUP   P:  Admit to birthing suites per consult with Dr. Laurence Ferrari, Junie Panning, NP 01/25/2019 10:29 AM

## 2019-01-25 NOTE — Progress Notes (Signed)
Labor Progress Note Fennie Panetta is a 28 y.o. G2P1001 at [redacted]w[redacted]d presented for IOL for gHTN. S: Foley bulb out and patient very uncomfortable.  O:  BP 131/85   Pulse 89   Temp (!) 97.5 F (36.4 C) (Oral)   Resp 18   Ht 5\' 7"  (1.702 m)   Wt 88.3 kg   LMP 04/23/2018   SpO2 100%   BMI 30.48 kg/m  EFM: 135, moderate variability, reactive, pos accels, no decels Ctx: q2-71m  CVE: Dilation: (P) 4.5 Effacement (%): (P) 50 Cervical Position: (P) Posterior Station: (P) -3 Presentation: (P) Vertex Exam by:: (P) Samentha Perham   A&P: 28 y.o. G2P1001 [redacted]w[redacted]d here for IOL for gHTN. #Labor: Progressing well. S/p Cytotec and Foley bulb. Now laboring on her own. Expectant management. Anticipate SVD. #Pain: per patient request #FWB: Cat I #GBS negative #gHTN: Pre-E labs negative; serial BP's  Chauncey Mann, MD 3:37 PM

## 2019-01-25 NOTE — Progress Notes (Signed)
Megan Schneider is a 28 y.o. G2P1001 at [redacted]w[redacted]d admitted for induction of labor due to Gestational Hypertension.  Subjective: Feeling fetal movement.  No LOF.  Considering Epidural  Objective: BP 126/76   Pulse 62   Temp 97.6 F (36.4 C) (Oral)   Resp 18   Ht 5\' 7"  (1.702 m)   Wt 88.3 kg   LMP 04/23/2018   SpO2 100%   BMI 30.48 kg/m  No intake/output data recorded.  FHT:  FHR: 115 bpm, variability: moderate,  accelerations:  Present,  decelerations:  Absent UC: Moderate on Toco  SVE:   Dilation: 5 Effacement (%): 50 Station: -2 Exam by:: Jerrell Mylar, RN/ Dr. Volanda Napoleon, Resident    Labs: Lab Results  Component Value Date   WBC 10.3 01/25/2019   HGB 11.3 (L) 01/25/2019   HCT 33.0 (L) 01/25/2019   MCV 90.7 01/25/2019   PLT 156 01/25/2019    Assessment / Plan: Megan Schneider is a 28 y.o G2P1001  Labor: s/p Cytotec, FB.  Little cervical change. Start Pitocin.  Consider AROM as appropriate Fetal Wellbeing:  Category I Pain Control:  Epidural and IV pain meds at patients request I/D:  GBS negative GHTN: monitor BP closely Anticipated MOD:  Vaginal Delivery  Carollee Leitz MD PGY1 Family Med Practice 01/25/2019, 7:37 PM

## 2019-01-25 NOTE — MAU Note (Signed)
Pt is G2P1 at [redacted]w[redacted]d.  Pt reporting intermittent leaking of clear, warm fluid since Wednesday night. Denies VB, +FM.  Denies any contractions or cramping.

## 2019-01-26 ENCOUNTER — Encounter (HOSPITAL_COMMUNITY): Payer: Self-pay | Admitting: Obstetrics & Gynecology

## 2019-01-26 DIAGNOSIS — O99324 Drug use complicating childbirth: Secondary | ICD-10-CM

## 2019-01-26 DIAGNOSIS — Z3A38 38 weeks gestation of pregnancy: Secondary | ICD-10-CM

## 2019-01-26 DIAGNOSIS — F1220 Cannabis dependence, uncomplicated: Secondary | ICD-10-CM

## 2019-01-26 DIAGNOSIS — O134 Gestational [pregnancy-induced] hypertension without significant proteinuria, complicating childbirth: Secondary | ICD-10-CM

## 2019-01-26 LAB — RPR: RPR Ser Ql: NONREACTIVE

## 2019-01-26 MED ORDER — PRENATAL MULTIVITAMIN CH
1.0000 | ORAL_TABLET | Freq: Every day | ORAL | Status: DC
  Administered 2019-01-26 – 2019-01-27 (×2): 1 via ORAL
  Filled 2019-01-26: qty 1

## 2019-01-26 MED ORDER — OXYCODONE HCL 5 MG PO TABS: 5.0000 mg | ORAL_TABLET | ORAL | Status: DC | PRN

## 2019-01-26 MED ORDER — ACETAMINOPHEN 325 MG PO TABS: 650.0000 mg | ORAL_TABLET | ORAL | Status: DC | PRN

## 2019-01-26 MED ORDER — IBUPROFEN 600 MG PO TABS
600.0000 mg | ORAL_TABLET | Freq: Four times a day (QID) | ORAL | Status: DC
  Administered 2019-01-26 – 2019-01-27 (×6): 600 mg via ORAL
  Filled 2019-01-26 (×6): qty 1

## 2019-01-26 MED ORDER — DIPHENHYDRAMINE HCL 25 MG PO CAPS: 25.0000 mg | ORAL_CAPSULE | Freq: Four times a day (QID) | ORAL | Status: DC | PRN

## 2019-01-26 MED ORDER — BENZOCAINE-MENTHOL 20-0.5 % EX AERO
1.0000 "application " | INHALATION_SPRAY | CUTANEOUS | Status: DC | PRN
  Administered 2019-01-26: 1 via TOPICAL
  Filled 2019-01-26: qty 56

## 2019-01-26 MED ORDER — DIBUCAINE (PERIANAL) 1 % EX OINT
1.0000 "application " | TOPICAL_OINTMENT | CUTANEOUS | Status: DC | PRN
  Administered 2019-01-26: 1 via RECTAL
  Filled 2019-01-26: qty 28

## 2019-01-26 MED ORDER — WITCH HAZEL-GLYCERIN EX PADS
1.0000 "application " | MEDICATED_PAD | CUTANEOUS | Status: DC | PRN
  Administered 2019-01-26: 1 via TOPICAL

## 2019-01-26 MED ORDER — SENNOSIDES-DOCUSATE SODIUM 8.6-50 MG PO TABS
2.0000 | ORAL_TABLET | ORAL | Status: DC
  Administered 2019-01-27: 2 via ORAL
  Filled 2019-01-26: qty 2

## 2019-01-26 MED ORDER — OXYCODONE HCL 5 MG PO TABS
5.0000 mg | ORAL_TABLET | ORAL | Status: DC | PRN
  Administered 2019-01-26: 5 mg via ORAL
  Filled 2019-01-26: qty 1

## 2019-01-26 MED ORDER — COCONUT OIL OIL: 1.0000 "application " | TOPICAL_OIL | Status: DC | PRN

## 2019-01-26 MED ORDER — TETANUS-DIPHTH-ACELL PERTUSSIS 5-2.5-18.5 LF-MCG/0.5 IM SUSP: 0.5000 mL | Freq: Once | INTRAMUSCULAR | Status: DC

## 2019-01-26 MED ORDER — MEASLES, MUMPS & RUBELLA VAC IJ SOLR: 0.5000 mL | Freq: Once | INTRAMUSCULAR | Status: DC

## 2019-01-26 MED ORDER — SIMETHICONE 80 MG PO CHEW
80.0000 mg | CHEWABLE_TABLET | ORAL | Status: DC | PRN
  Administered 2019-01-26: 80 mg via ORAL
  Filled 2019-01-26: qty 1

## 2019-01-26 MED ORDER — ONDANSETRON HCL 4 MG/2ML IJ SOLN: 4.0000 mg | INTRAMUSCULAR | Status: DC | PRN

## 2019-01-26 MED ORDER — ONDANSETRON HCL 4 MG PO TABS: 4.0000 mg | ORAL_TABLET | ORAL | Status: DC | PRN

## 2019-01-26 NOTE — Discharge Summary (Signed)
Postpartum Discharge Summary      Patient Name: Megan Schneider DOB: 02-18-1990 MRN: [id number]  Date of admission: 01/25/2019 Delivering Provider: Julianne Handler   Date of discharge: 01/27/2019  Admitting diagnosis: Chronic bilateral low back pain without sciatica [M54.5, G89.29] Intrauterine pregnancy: [redacted]w[redacted]d    Secondary diagnosis:  Active Problems:   Subclinical hyperthyroidism   Chronic bilateral low back pain without sciatica   Gestational hypertension   [redacted] weeks gestation of pregnancy   SVD (spontaneous vaginal delivery)  Additional problems: none     Discharge diagnosis: Term Pregnancy Delivered and Gestational Hypertension                                                                                                Post partum procedures:none  Augmentation: AROM and Pitocin  Complications: None  Hospital course:  Induction of Labor With Vaginal Delivery   28y.o. yo G2P1001 at 364w3das admitted to the hospital 01/25/2019 for induction of labor.  Indication for induction: Gestational hypertension.  Patient had an uncomplicated labor course as follows: Membrane Rupture Time/Date: 10:07 PM ,01/25/2019   Intrapartum Procedures: Episiotomy: None [1]                                         Lacerations:  1st degree [2];Perineal [11]  Patient had delivery of a Viable infant.  Information for the patient's newborn:  WiMaloni, Musleh0[[id number]]Delivery Method: Vaginal, Spontaneous(Filed from Delivery Summary)    01/25/2019  Details of delivery can be found in separate delivery note.  Patient had a routine postpartum course. Patient is discharged home 01/27/19. Delivery time: 10:43 PM    Magnesium Sulfate received: No BMZ received: No Rhophylac:N/A MMR:N/A Transfusion:No  Physical exam  Vitals:   01/26/19 1145 01/26/19 1540 01/26/19 2156 01/27/19 0539  BP: 132/77 133/77 120/74 128/90  Pulse: (!) 107 60 82 64  Resp: '16 18 18 18  ' Temp: 99.3 F  (37.4 C) 98.3 F (36.8 C) 98.3 F (36.8 C) 97.7 F (36.5 C)  TempSrc: Oral Oral Oral Oral  SpO2: 100% 99% 97% 100%  Weight:      Height:       General: alert, cooperative and no distress Lochia: appropriate Uterine Fundus: firm Incision: N/A DVT Evaluation: No evidence of DVT seen on physical exam. Labs: Lab Results  Component Value Date   WBC 10.3 01/25/2019   HGB 11.3 (L) 01/25/2019   HCT 33.0 (L) 01/25/2019   MCV 90.7 01/25/2019   PLT 156 01/25/2019   CMP Latest Ref Rng & Units 01/25/2019  Glucose 70 - 99 mg/dL 80  BUN 6 - 20 mg/dL 7  Creatinine 0.44 - 1.00 mg/dL 0.71  Sodium 135 - 145 mmol/L 138  Potassium 3.5 - 5.1 mmol/L 3.9  Chloride 98 - 111 mmol/L 109  CO2 22 - 32 mmol/L 23  Calcium 8.9 - 10.3 mg/dL 8.3(L)  Total Protein 6.5 - 8.1 g/dL 5.6(L)  Total Bilirubin 0.3 - 1.2 mg/dL 0.8  Alkaline Phos 38 - 126 U/L 151(H)  AST 15 - 41 U/L 14(L)  ALT 0 - 44 U/L 9    Discharge instruction: per After Visit Summary and "Baby and Me Booklet".  After visit meds:  Allergies as of 01/27/2019   No Known Allergies     Medication List    TAKE these medications   Blood Pressure Kit Z34.90 To monitored regularly at home Large   ibuprofen 600 MG tablet Commonly known as: ADVIL Take 1 tablet (600 mg total) by mouth every 6 (six) hours.   Prenatal Vitamins 0.8 MG tablet Take 1 tablet by mouth daily.       Diet: routine diet  Activity: Advance as tolerated. Pelvic rest for 6 weeks.   Outpatient follow up:6 weeks Follow up Appt: Future Appointments  Date Time Provider [place]  01/28/2019 10:55 AM Leftwich-Kirby, Kathie Dike, CNM [place] None   Follow up Visit: [place] Follow up.   Why: In 1 week for blood pressure check, and in 6 weeks for postpartum visit Contact information: [address] [place] [address] [phone]           Please schedule this patient for  Postpartum visit in: 6 weeks with the following provider: Any provider For C/S patients schedule nurse incision check in weeks 2 weeks: no High risk pregnancy complicated by: HTN Delivery mode:  SVD Anticipated Birth Control:  patch. Discussed LARCs as most effective forms of birth control on 01/27/19.   PP Procedures needed: BP check  Schedule Integrated BH visit: no      Newborn Data: Live born female  Birth Weight:   APGAR: 52, 9  Newborn Delivery   Birth date/time: 01/25/2019 22:43:00 Delivery type: Vaginal, Spontaneous      Baby Feeding: Bottle Disposition:home with mother   01/27/2019 Fatima Blank, CNM

## 2019-01-26 NOTE — Clinical Social Work Maternal (Signed)
CLINICAL SOCIAL WORK MATERNAL/CHILD NOTE  Patient Details  Name: Megan Schneider  MRN: [id number]  Date of Birth: 01/25/2018  Date: 01/26/2019  Clinical Social Worker Initiating Note: Madilyn Fireman, MSW, LCSW-A Date/Time: Initiated: 01/26/19/1031  Child's Name: Megan Schneider  Biological Parents: Father, Mother  Need for Interpreter:  Reason for Referral: Current Substance Use/Substance Use During Pregnancy  Address: [place] [address]  Phone number: [phone] (home)  Additional phone number:  Household Members/Support Persons (HM/SP): Household Member/Support Person 1, Household Member/Support Person 2  HM/SP Name Relationship DOB or Age  HM/SP -1 Megan Schneider FOB   HM/SP -2 Megan Schneider Child 09-20-2016  HM/SP -3     HM/SP -4     HM/SP -5     HM/SP -6     HM/SP -7     HM/SP -8     Natural Supports (not living in the home): Extended Family, Friends, Immediate Family  Professional Supports: None  Employment: Full-time  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[employer], retail: Vocation/technical training  Homebound arranged:  Museum/gallery curator Resources: Medicaid  Other Resources: Physicist, medical , [place] Considerations Which May Impact Care: None  Strengths: Home prepared for child , Ability to meet basic needs , Pediatrician chosen  Psychotropic Medications:  Pediatrician: [place] area  Pediatrician List:  [facility] [place]   Pediatrician Fax Number:  Risk Factors/Current Problems: None  Cognitive State: Alert  Mood/Affect: Calm , Happy , Interested  CSW Assessment:  CSW received consult for Megan Schneider due to current substance use. CSW met with Megan Schneider and infant Megan Schneider at bedside to complete discussion. CSW explained hospital drug screening policies to [facility] and she stated understanding without any questions or concerns. Megan Schneider reports smoking  marijuana during pregnancy to help with her appetite. CSW explained that a CPS report will be made regarding Megan Schneider's positive UDS for marijuana. Megan Schneider denies any history of CPS involvement. Megan Schneider reports this is her second child, her oldest is a two year old female named Megan Schneider. Megan Schneider reports FOB is Megan Schneider who is supportive and caring. Megan Schneider reports FOB is at home with their 27 year old. 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 Megan Schneider reports she has a car seat for safe transportation with knowledge of installation and use. Megan Schneider reports having a crib and bassinet at home for safe sleeping. SIDS precautions were thoroughly reviewed. Megan Schneider reports choosing [facility] for pediatric care. Megan Schneider reports she receives Medicaid, [employer], and WIC. Megan Schneider reports she is bottle feeding. Megan Schneider denies any mental health history.  CSW made CPS report to Megan Schneider of [facility]. CPS report was accepted, no barriers to discharge mentioned. CSW will continue monitoring chart for cord results and will notify CPS if necessary.  CSW Plan/Description: CSW Will Continue to Monitor Umbilical Cord Tissue Drug Screen Results and Make Report if Megan Counts, LCSW  01/26/2019, 10:41 AM

## 2019-01-26 NOTE — Progress Notes (Signed)
Post Partum Day 1 S/p SVD 01/25/19 at 2243  Subjective: no complaints, up ad lib, voiding, tolerating PO and + flatus  Objective: Blood pressure 128/76, pulse (!) 59, temperature 98.5 F (36.9 C), temperature source Oral, resp. rate 16, height 5\' 7"  (1.702 m), weight 88.3 kg, last menstrual period 04/23/2018, SpO2 100 %, unknown if currently breastfeeding.  Physical Exam:  General: alert, cooperative, appears stated age and no distress Lochia: appropriate Uterine Fundus: firm DVT Evaluation: No evidence of DVT seen on physical exam.  Recent Labs    01/25/19 1015  HGB 11.3*  HCT 33.0*    Assessment/Plan: Plan for discharge tomorrow   LOS: 1 day   Darlina Rumpf, CNM 01/26/2019, [phone]

## 2019-01-27 MED ORDER — IBUPROFEN 600 MG PO TABS: 600.0000 mg | ORAL_TABLET | Freq: Four times a day (QID) | ORAL | 0 refills | Status: DC

## 2019-01-27 NOTE — Progress Notes (Signed)
[place]  01/27/2019  Megan Schneider Apr 10, 1990 [id number]   [place]  01/27/2019  Megan Schneider 01/25/2018 [id number]   CSW noted social work consult performed by Madilyn Fireman, LCSW, on Saturday, January 26, 2019, around 10:41am.  [name] filed Child Protective Services report to Demetrius Charity at the [place]. CPS report was accepted, due to [facility] admitting to Marijuana use during pregnancy to try and help increase appetite.  Rapid Urine Drug Screen, performed on Saturday, January 26, 2019, yielded positive results for Tetrahydrocannabinol.  MOB is aware of report filed with Child Protective Services.  CSW will continue to monitor THC - COOH, Cord Qualitative and Drug Detection Panel, Umbilical Cord Qualitative and notify [name] if results are positive.  Megan Schneider, BSW, MSW, [employer]  Licensed Holiday representative  Cell # [phone]  [email or web address]

## 2019-01-28 ENCOUNTER — Other Ambulatory Visit: Payer: Self-pay | Admitting: Advanced Practice Midwife

## 2019-01-28 ENCOUNTER — Telehealth: Payer: Medicaid Other | Admitting: Advanced Practice Midwife

## 2019-01-28 DIAGNOSIS — O872 Hemorrhoids in the puerperium: Secondary | ICD-10-CM

## 2019-01-28 MED ORDER — LIDOCAINE HCL 2 % EX GEL: 1.0000 "application " | CUTANEOUS | 0 refills | Status: DC | PRN

## 2019-01-31 ENCOUNTER — Other Ambulatory Visit: Payer: Self-pay

## 2019-01-31 ENCOUNTER — Ambulatory Visit (INDEPENDENT_AMBULATORY_CARE_PROVIDER_SITE_OTHER): Payer: Medicaid Other | Admitting: Obstetrics & Gynecology

## 2019-01-31 ENCOUNTER — Encounter: Payer: Self-pay | Admitting: Obstetrics & Gynecology

## 2019-01-31 VITALS — BP 144/77 | HR 66 | Ht 67.0 in | Wt 182.1 lb

## 2019-01-31 DIAGNOSIS — O872 Hemorrhoids in the puerperium: Secondary | ICD-10-CM | POA: Insufficient documentation

## 2019-01-31 DIAGNOSIS — O139 Gestational [pregnancy-induced] hypertension without significant proteinuria, unspecified trimester: Secondary | ICD-10-CM

## 2019-01-31 HISTORY — DX: Hemorrhoids in the puerperium: O87.2

## 2019-01-31 MED ORDER — HYDROCORTISONE (PERIANAL) 2.5 % EX CREA: TOPICAL_CREAM | Freq: Two times a day (BID) | CUTANEOUS | 2 refills | Status: DC

## 2019-01-31 MED ORDER — TRAMADOL HCL 50 MG PO TABS: 50.0000 mg | ORAL_TABLET | Freq: Four times a day (QID) | ORAL | 0 refills | Status: DC | PRN

## 2019-01-31 MED ORDER — NIFEDIPINE ER OSMOTIC RELEASE 30 MG PO TB24: 30.0000 mg | ORAL_TABLET | Freq: Every day | ORAL | 2 refills | Status: DC

## 2019-01-31 MED ORDER — CYCLOBENZAPRINE HCL 10 MG PO TABS: 10.0000 mg | ORAL_TABLET | Freq: Three times a day (TID) | ORAL | 1 refills | Status: DC | PRN

## 2019-01-31 NOTE — Progress Notes (Signed)
   OFFICE POSTPARTUM VISIT NOTE  History:   Megan Schneider is a 28 y.o. [phone] here today for postpartum BP check; she is 2 weeks s/p SVD after IOL for GHTN. Discharged without medications for BP. Patient denies any headaches, visual symptoms, RUQ/epigastric pain or other concerning symptoms.  Also reports having painful hemorrhoids, not alleviated by home therapies. Very painful, unable to sit.  She denies any other concerns.    The following portions of the patient's history were reviewed and updated as appropriate: allergies, current medications, past family history, past medical history, past social history, past surgical history and problem list.    Review of Systems:  Pertinent items noted in HPI and remainder of comprehensive ROS otherwise negative.  Physical Exam:  BP (!) 144/77   Pulse 66   Ht 5\' 7"  (1.702 m)   Wt 182 lb 1.3 oz (82.6 kg)   LMP 04/23/2018   BMI 28.52 kg/m  CONSTITUTIONAL: Well-developed, well-nourished female in distress due to hemorrhoids, unable to sit.  MUSCULOSKELETAL: Normal range of motion. 1+ BLE edema noted. NEUROLOGIC: Alert and oriented to person, place, and time. Normal muscle tone coordination. No cranial nerve deficit noted. PSYCHIATRIC: Normal mood and affect. Normal behavior. Normal judgment and thought content. CARDIOVASCULAR: Normal heart rate noted RESPIRATORY: Effort and breath sounds normal, no problems with respiration noted ABDOMEN: No masses noted. No other overt distention noted.   PELVIC: Normal appearing external genitalia. Healing perineal laceration. Three external large (~8-9 mm in size) hemorrhoids noted. No erythema but exquisitely painful to touch.     Assessment and Plan:    1. Gestational hypertension, antepartum Medication started for BP control - NIFEdipine (PROCARDIA-XL/NIFEDICAL-XL) 30 MG 24 hr tablet; Take 1 tablet (30 mg total) by mouth daily.  Dispense: 30 tablet; Refill: 2  2. Hemorrhoids during puerperium with  postpartum complication Anusol ordered. Also given Tramadol for pain, will continue Ibuprofen.  Flexeril also given to help with hip pain.  - hydrocortisone (ANUSOL-HC) 2.5 % rectal cream; Place rectally 2 (two) times daily.  Dispense: 30 g; Refill: 2 - traMADol (ULTRAM) 50 MG tablet; Take 1-2 tablets (50-100 mg total) by mouth every 6 (six) hours as needed for severe pain.  Dispense: 30 tablet; Refill: 0 - cyclobenzaprine (FLEXERIL) 10 MG tablet; Take 1 tablet (10 mg total) by mouth every 8 (eight) hours as needed for muscle spasms.  Dispense: 30 tablet; Refill: 1  3. Postpartum care following vaginal delivery Follow up next week.  Routine preventative health maintenance measures emphasized. Please refer to After Visit Summary for other counseling recommendations.   Return in about 1 week (around 02/07/2019) for Postpartum BP Check and Hemorrhoid follow up.    Total face-to-face time with patient: 15 minutes.  Over 50% of encounter was spent on counseling and coordination of care.   Verita Schneiders, MD, [place] for [employer], [place]

## 2019-02-01 ENCOUNTER — Telehealth: Payer: Medicaid Other

## 2019-02-12 ENCOUNTER — Ambulatory Visit: Payer: Medicaid Other | Admitting: Family Medicine

## 2019-02-14 ENCOUNTER — Ambulatory Visit: Payer: PRIVATE HEALTH INSURANCE | Admitting: *Deleted

## 2019-02-14 DIAGNOSIS — O165 Unspecified maternal hypertension, complicating the puerperium: Secondary | ICD-10-CM

## 2019-02-14 NOTE — Progress Notes (Signed)
I connected with  Particia Nearing on 02/14/19 by a video enabled telemedicine application and verified that I am speaking with the correct person using two identifiers.   I discussed the limitations of evaluation and management by telemedicine. The patient expressed understanding and agreed to proceed.    Subjective:  Megan Schneider is a 28 y.o. female here for BP check.   Hypertension ROS: taking medications as instructed, no medication side effects noted, no TIA's, no chest pain on exertion, no dyspnea on exertion and no swelling of ankles. Pt did have HA x 2 days ago- none today. Denies any visual changes.    Objective:  LMP 04/23/2018   BP (!) 168/121 (BP Location: Right Arm)   Pulse 68   LMP 04/23/2018  Appearance alert, well appearing, and in no distress. General exam BP noted to be well controlled today in office.   Assessment:   Blood Pressure needs further observation.   Plan:  pt to be seen at ED today for evaluation, per Dr Darene Lamer. Pt agrees to plan

## 2019-02-14 NOTE — Progress Notes (Signed)
Chart reviewed for nurse visit. Agree with plan of care.   Enslee Bibbins L, DO 02/14/2019 11:00 AM

## 2019-02-25 ENCOUNTER — Other Ambulatory Visit: Payer: Self-pay

## 2019-02-25 ENCOUNTER — Encounter: Payer: Self-pay | Admitting: Obstetrics and Gynecology

## 2019-02-25 ENCOUNTER — Ambulatory Visit (INDEPENDENT_AMBULATORY_CARE_PROVIDER_SITE_OTHER): Payer: PRIVATE HEALTH INSURANCE | Admitting: Obstetrics and Gynecology

## 2019-02-25 VITALS — BP 145/94 | HR 86 | Wt 172.0 lb

## 2019-02-25 DIAGNOSIS — I10 Essential (primary) hypertension: Secondary | ICD-10-CM

## 2019-02-25 DIAGNOSIS — Z1389 Encounter for screening for other disorder: Secondary | ICD-10-CM | POA: Diagnosis not present

## 2019-02-25 NOTE — Patient Instructions (Signed)
amb ref °

## 2019-02-25 NOTE — Progress Notes (Signed)
Post Partum Exam  Megan Schneider is a 29 y.o. G56P2002 female who presents for a postpartum visit. She is 4 weeks postpartum following a spontaneous vaginal delivery. I have fully reviewed the prenatal and intrapartum course. The delivery was at 54 gestational weeks.  Anesthesia: local. Postpartum course has been complicated by PP hypertension. Baby's course has been doing well. Baby is feeding by bottle Dory Horn soothe. Bleeding no bleeding. Bowel function is normal. Bladder function is normal. Patient is not sexually active. Contraception method is abstinence.  Postpartum depression screening:neg, score 1.    Last pap smear done 06/2018 and was Normal  Review of Systems Pertinent items noted in HPI and remainder of comprehensive ROS otherwise negative.    Objective:  Blood pressure (!) 145/94, pulse 86, weight 172 lb (78 kg), last menstrual period 04/23/2018, unknown if currently breastfeeding.  General:  alert, cooperative and no distress   Breasts:  inspection negative, no nipple discharge or bleeding, no masses or nodularity palpable  Lungs: clear to auscultation bilaterally  Heart:  regular rate and rhythm  Abdomen: soft, non-tender; bowel sounds normal; no masses,  no organomegaly   Vulva:  normal  Vagina: normal vagina, no discharge, exudate, lesion, or erythema  Cervix:  multiparous appearance  Corpus: normal size, contour, position, consistency, mobility, non-tender  Adnexa:  normal adnexa and no mass, fullness, tenderness  Rectal Exam: Not performed.        Assessment:    Normal postpartum exam. Pap smear not done at today's visit.   Plan:   1. Contraception: condoms 2. Patient is medically cleared to resume all activities of daily living. Patient to continue with antihypertensive and will be referred to see PCP 3. Follow up in: 6 months for annual exam or as needed.

## 2019-04-24 ENCOUNTER — Telehealth: Payer: Self-pay

## 2019-04-24 NOTE — Telephone Encounter (Signed)
Called patient to do their pre-visit COVID screening.  Call went to voicemail, which is full. Unable to do prescreening.  

## 2019-04-24 NOTE — Patient Instructions (Signed)
Thank you for choosing Primary Care at [facility] to be your medical home!    Megan Schneider was seen by Megan Schools, DO today.   Megan Schneider's primary care provider is Megan Myron, DO.   For the best care possible, you should try to see Megan Myron, DO whenever you come to the clinic.   We look forward to seeing you again soon!  If you have any questions about your visit today, please call us at [phone] or feel free to reach your primary care provider via [place].

## 2019-04-25 ENCOUNTER — Ambulatory Visit (INDEPENDENT_AMBULATORY_CARE_PROVIDER_SITE_OTHER): Payer: PRIVATE HEALTH INSURANCE | Admitting: Internal Medicine

## 2019-04-25 ENCOUNTER — Encounter: Payer: Self-pay | Admitting: Internal Medicine

## 2019-04-25 ENCOUNTER — Other Ambulatory Visit: Payer: Self-pay

## 2019-04-25 VITALS — BP 124/82 | HR 76 | Temp 97.3°F | Resp 17 | Ht 66.0 in | Wt 180.0 lb

## 2019-04-25 DIAGNOSIS — Z13228 Encounter for screening for other metabolic disorders: Secondary | ICD-10-CM | POA: Diagnosis not present

## 2019-04-25 DIAGNOSIS — Z8759 Personal history of other complications of pregnancy, childbirth and the puerperium: Secondary | ICD-10-CM

## 2019-04-25 DIAGNOSIS — Z7689 Persons encountering health services in other specified circumstances: Secondary | ICD-10-CM | POA: Diagnosis not present

## 2019-04-25 NOTE — Progress Notes (Signed)
  Subjective:    Megan Schneider - 29 y.o. female MRN [id number]  Date of birth: Aug 30, 1990  HPI  Megan Schneider is to establish care. Patient has a PMH significant for gestational HTN. SVD 01/26/19. Has never had high BP outside of pregnancy. Was taking Procardia 30 mg tablet daily. Stopped it almost 2 weeks ago because she ran out of medication. BP at home has been 125-140/70-90. No chest pain, headaches, vision changes, SOB.     ROS per HPI     Health Maintenance:  There are no preventive care reminders to display for this patient.   Past Medical History: Patient Active Problem List   Diagnosis Date Noted  . Hemorrhoids during puerperium with postpartum complication [id number]  . Chronic bilateral low back pain without sciatica 01/25/2019  . Gestational hypertension 01/25/2019  . Cannabis hyperemesis syndrome concurrent with and due to cannabis abuse ([place]) 08/02/2018  . Subclinical hyperthyroidism 07/14/2018      Social History   reports that she has never smoked. She has never used smokeless tobacco. She reports previous drug use. Drug: Marijuana. She reports that she does not drink alcohol.   Family History  family history includes Diabetes in her father.   Medications: reviewed and updated   Objective:   Physical Exam BP 124/82   Pulse 76   Temp (!) 97.3 F (36.3 C) (Temporal)   Resp 17   Ht 5\' 6"  (1.676 m)   Wt 180 lb (81.6 kg)   LMP 04/12/2019 (Exact Date)   SpO2 97%   Breastfeeding No   BMI 29.05 kg/m  Physical Exam  Constitutional: She is oriented to person, place, and time and well-developed, well-nourished, and in no distress. No distress.  HENT:  Head: Normocephalic and atraumatic.  Eyes: Conjunctivae and EOM are normal.  Cardiovascular: Normal rate, regular rhythm and normal heart sounds.  No murmur heard. Pulmonary/Chest: Effort normal and breath sounds normal. No respiratory distress.  Musculoskeletal:        General: Normal range of  motion.  Neurological: She is alert and oriented to person, place, and time.  Skin: Skin is warm and dry. She is not diaphoretic.  Psychiatric: Affect and judgment normal.        Assessment & Plan:    1. Encounter to establish care  2. History of gestational hypertension BP well within goal today and sounds essentially at goal with home BP monitoring. Discussed that having had gHTN increases her risk for developing chronic HTN outside of pregnancy. As of now, BP is normotensive in non-gravid state and does not need to resume Procardia. Will continue to monitor at future visits.  - CBC with Differential - Basic Metabolic Panel  3. Screening for metabolic disorder - CBC with Differential - Basic Metabolic Panel    Phill Myron, D.O. 04/25/2019, 10:57 AM Primary Care at [facility]

## 2019-04-26 LAB — CBC WITH DIFFERENTIAL/PLATELET
Basophils Absolute: 0.1 10*3/uL (ref 0.0–0.2)
Basos: 1 %
EOS (ABSOLUTE): 0.3 10*3/uL (ref 0.0–0.4)
Eos: 5 %
Hematocrit: 36.5 % (ref 34.0–46.6)
Hemoglobin: 12.3 g/dL (ref 11.1–15.9)
Immature Grans (Abs): 0 10*3/uL (ref 0.0–0.1)
Immature Granulocytes: 0 %
Lymphocytes Absolute: 2.2 10*3/uL (ref 0.7–3.1)
Lymphs: 45 %
MCH: 29.3 pg (ref 26.6–33.0)
MCHC: 33.7 g/dL (ref 31.5–35.7)
MCV: 87 fL (ref 79–97)
Monocytes Absolute: 0.5 10*3/uL (ref 0.1–0.9)
Monocytes: 9 %
Neutrophils Absolute: 2 10*3/uL (ref 1.4–7.0)
Neutrophils: 40 %
Platelets: 207 10*3/uL (ref 150–450)
RBC: 4.2 x10E6/uL (ref 3.77–5.28)
RDW: 14 % (ref 11.7–15.4)
WBC: 5 10*3/uL (ref 3.4–10.8)

## 2019-04-26 LAB — BASIC METABOLIC PANEL
BUN/Creatinine Ratio: 13 (ref 9–23)
BUN: 12 mg/dL (ref 6–20)
CO2: 23 mmol/L (ref 20–29)
Calcium: 8.5 mg/dL — ABNORMAL LOW (ref 8.7–10.2)
Chloride: 107 mmol/L — ABNORMAL HIGH (ref 96–106)
Creatinine, Ser: 0.96 mg/dL (ref 0.57–1.00)
GFR calc Af Amer: 94 mL/min/{1.73_m2} (ref >59)
GFR calc non Af Amer: 81 mL/min/{1.73_m2} (ref >59)
Glucose: 110 mg/dL — ABNORMAL HIGH (ref 65–99)
Potassium: 4.2 mmol/L (ref 3.5–5.2)
Sodium: 143 mmol/L (ref 134–144)

## 2019-04-29 NOTE — Progress Notes (Signed)
Patient notified of results & recommendations. Expressed understanding.

## 2019-10-29 DIAGNOSIS — U071 COVID-19: Secondary | ICD-10-CM | POA: Diagnosis not present

## 2019-11-21 ENCOUNTER — Telehealth: Payer: PRIVATE HEALTH INSURANCE | Admitting: Internal Medicine

## 2019-11-22 ENCOUNTER — Ambulatory Visit (HOSPITAL_COMMUNITY)
Admission: EM | Admit: 2019-11-22 | Discharge: 2019-11-22 | Disposition: A | Discharge status: Home / Self Care | Payer: Medicaid Other | Attending: Family Medicine | Admitting: Family Medicine

## 2019-11-22 ENCOUNTER — Other Ambulatory Visit: Payer: Self-pay

## 2019-11-22 ENCOUNTER — Encounter (HOSPITAL_COMMUNITY): Payer: Self-pay | Admitting: Emergency Medicine

## 2019-11-22 DIAGNOSIS — M79642 Pain in left hand: Secondary | ICD-10-CM | POA: Diagnosis not present

## 2019-11-22 MED ORDER — IBUPROFEN 600 MG PO TABS: 600.0000 mg | ORAL_TABLET | Freq: Three times a day (TID) | ORAL | 0 refills | Status: DC | PRN

## 2019-11-22 NOTE — Discharge Instructions (Addendum)
Wear the compression.  Rest, ice the area.  600 of ibuprofen every 8 hours for the next week. If the lymphadenopathy or swollen lymph nodes in your neck do not resolve I would follow-up with your primary care for further evaluation

## 2019-11-22 NOTE — ED Triage Notes (Signed)
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 Left hand has lost grip strength over time.   Also wants to be evaluated for "lumps" in her neck that have been present for 2 weeks. Also notes that her most recent period was very heavy.

## 2019-11-25 NOTE — ED Provider Notes (Signed)
[place]    CSN: [id number] Arrival date & time: 11/22/19  0830      History   Chief Complaint Chief Complaint  Patient presents with  . Hand Pain    HPI Megan Schneider is a 29 y.o. female.   Patient is a 29 year old female presents today with multiple complaints.  First clinic being left hand pain.  This hand is located to the dorsal side of the left hand.  Pain is worse with pulling and lifting.  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  Reporting some decreased strength in the hand due to pain.  No numbness or tingling.  No specific injuries. Also complaining of lumps in neck area.  This is been present for the past 2 weeks.  Fever, chills, neck pain.     Past Medical History:  Diagnosis Date  . Asthma    occasionally, exercise-induced; uses inhaler; not used "in a while"  . Marijuana use 08/24/2016   Positive tox screen in Jan 2018  . Scoliosis     Patient Active Problem List   Diagnosis Date Noted  . Hemorrhoids during puerperium with postpartum complication 40/98/1191  . Chronic bilateral low back pain without sciatica 01/25/2019  . Gestational hypertension 01/25/2019  . Cannabis hyperemesis syndrome concurrent with and due to cannabis abuse ([place]) 08/02/2018  . Subclinical hyperthyroidism 07/14/2018    Past Surgical History:  Procedure Laterality Date  . NO PAST SURGERIES      OB History    Gravida  2   Para  2   Term  2   Preterm  0   AB  0   Living  2     SAB  0   TAB  0   Ectopic  0   Multiple  0   Live Births  2            Home Medications    Prior to Admission medications   Medication Sig Start Date End Date Taking? Authorizing Provider  Blood Pressure KIT Z34.90 To monitored regularly at home Large 07/05/18   Sloan Leiter, MD  ibuprofen (ADVIL) 600 MG tablet Take 1 tablet (600 mg total) by mouth every 8 (eight) hours as needed for moderate pain. 11/22/19   Loura Halt  A, NP  NIFEdipine (PROCARDIA-XL/NIFEDICAL-XL) 30 MG 24 hr tablet Take 1 tablet (30 mg total) by mouth daily. Patient not taking: Reported on 04/25/2019 01/31/19   Osborne Oman, MD    Family History Family History  Problem Relation Age of Onset  . Diabetes Father     Social History Social History   Tobacco Use  . Smoking status: Never Smoker  . Smokeless tobacco: Never Used  Vaping Use  . Vaping Use: Never used  Substance Use Topics  . Alcohol use: No    Alcohol/week: 0.0 standard drinks  . Drug use: Not Currently    Types: Marijuana    Comment: reported last time was when "[redacted] weeks pregnant"     Allergies   Patient has no known allergies.   Review of Systems Review of Systems   Physical Exam Triage Vital Signs ED Triage Vitals  Enc Vitals Group     BP 11/22/19 0923 129/84     Pulse Rate 11/22/19 0923 74     Resp 11/22/19 0923 16     Temp 11/22/19 0923 98.4 F (36.9 C)     Temp Source 11/22/19 0923 Oral  SpO2 11/22/19 0923 99 %     Weight --      Height --      Head Circumference --      Peak Flow --      Pain Score 11/22/19 0921 9     Pain Loc --      Pain Edu? --      Excl. in [place]? --    No data found.  Updated Vital Signs BP 129/84   Pulse 74   Temp 98.4 F (36.9 C) (Oral)   Resp 16   LMP 11/11/2019   SpO2 99%   Visual Acuity Right Eye Distance:   Left Eye Distance:   Bilateral Distance:    Right Eye Near:   Left Eye Near:    Bilateral Near:     Physical Exam Vitals and nursing note reviewed.  Constitutional:      General: She is not in acute distress.    Appearance: Normal appearance. She is not ill-appearing, toxic-appearing or diaphoretic.  HENT:     Head: Normocephalic.     Nose: Nose normal.  Eyes:     Conjunctiva/sclera: Conjunctivae normal.  Neck:     Comments: Occipital lymphadenopathy.  Pulmonary:     Effort: Pulmonary effort is normal.  Musculoskeletal:        General: Normal range of motion.       Hands:      Cervical back: Normal range of motion.     Comments: TTP no swelling   Lymphadenopathy:     Cervical: No cervical adenopathy.  Skin:    General: Skin is warm and dry.     Findings: No rash.  Neurological:     Mental Status: She is alert.  Psychiatric:        Mood and Affect: Mood normal.      UC Treatments / Results  Labs (all labs ordered are listed, but only abnormal results are displayed) Labs Reviewed - No data to display  EKG   Radiology No results found.  Procedures Procedures (including critical care time)  Medications Ordered in UC Medications - No data to display  Initial Impression / Assessment and Plan / UC Course  I have reviewed the triage vital signs and the nursing notes.  Pertinent labs & imaging results that were available during my care of the patient were reviewed by me and considered in my medical decision making (see chart for details).     Left hand pain Most likely tendinitis Recommend ibuprofen every 8 hours for the next week Rest, ice, elevate, compression  Occipital lymphadenopathy Recommend follow-up if this does not resolve. No concerns for infection at this time.  No other concerning symptoms Final Clinical Impressions(s) / UC Diagnoses   Final diagnoses:  Left hand pain     Discharge Instructions     Wear the compression.  Rest, ice the area.  600 of ibuprofen every 8 hours for the next week. If the lymphadenopathy or swollen lymph nodes in your neck do not resolve I would follow-up with your primary care for further evaluation    ED Prescriptions    Medication Sig Dispense Auth. Provider   ibuprofen (ADVIL) 600 MG tablet Take 1 tablet (600 mg total) by mouth every 8 (eight) hours as needed for moderate pain. 30 tablet Loura Halt A, NP     PDMP not reviewed this encounter.   Orvan July, NP 11/25/19 1624

## 2019-12-12 ENCOUNTER — Ambulatory Visit: Payer: PRIVATE HEALTH INSURANCE

## 2019-12-12 DIAGNOSIS — E0590 Thyrotoxicosis, unspecified without thyrotoxic crisis or storm: Secondary | ICD-10-CM

## 2019-12-18 ENCOUNTER — Ambulatory Visit: Payer: PRIVATE HEALTH INSURANCE | Admitting: Obstetrics

## 2020-04-18 ENCOUNTER — Encounter: Payer: PRIVATE HEALTH INSURANCE | Admitting: Physician Assistant

## 2020-04-18 ENCOUNTER — Telehealth: Payer: Medicaid Other | Admitting: Physician Assistant

## 2020-04-18 ENCOUNTER — Encounter: Payer: Self-pay | Admitting: Physician Assistant

## 2020-04-18 DIAGNOSIS — Z20822 Contact with and (suspected) exposure to covid-19: Secondary | ICD-10-CM

## 2020-04-18 DIAGNOSIS — J028 Acute pharyngitis due to other specified organisms: Secondary | ICD-10-CM | POA: Diagnosis not present

## 2020-04-18 MED ORDER — AZITHROMYCIN 250 MG PO TABS: ORAL_TABLET | ORAL | 0 refills | Status: DC

## 2020-04-18 NOTE — Progress Notes (Signed)
Antibiotic changed to Azithromycin 250 mg from Amoxicillin 500 mg as was dictated in the note. Only Azithromycin has been sent to the pharmacy.

## 2020-04-18 NOTE — Progress Notes (Signed)
Duplicate Evisit- please cancel   If you do not have a PCP, [facility] offers a free physician referral service available at [phone]. Our trained staff has the experience, knowledge and resources to put you in touch with a physician who is right for you.   You also have the option of a video visit through [email or web address]  If you are having a true medical emergency please call 911.  NOTE: If you entered your credit card information for this eVisit, you will not be charged. You may see a "hold" on your card for the $35 but that hold will drop off and you will not have a charge processed.  Your e-visit answers were reviewed by a board certified advanced clinical practitioner to complete your personal care plan.  Thank you for using e-Visits. I spent 5-10 minutes on review and completion of this note- Lacy Duverney [facility]

## 2020-07-10 ENCOUNTER — Encounter: Payer: Self-pay | Admitting: Internal Medicine

## 2020-07-10 ENCOUNTER — Ambulatory Visit: Payer: Medicaid Other | Admitting: Internal Medicine

## 2020-07-10 ENCOUNTER — Other Ambulatory Visit: Payer: Self-pay

## 2020-07-10 ENCOUNTER — Other Ambulatory Visit (HOSPITAL_COMMUNITY)
Admission: RE | Admit: 2020-07-10 | Discharge: 2020-07-10 | Disposition: A | Discharge status: Home / Self Care | Payer: Medicaid Other | Source: Ambulatory Visit | Attending: Internal Medicine | Admitting: Internal Medicine

## 2020-07-10 VITALS — BP 152/96 | HR 72 | Temp 97.3°F | Resp 16 | Wt 189.0 lb

## 2020-07-10 DIAGNOSIS — R102 Pelvic and perineal pain: Secondary | ICD-10-CM

## 2020-07-10 DIAGNOSIS — Z8759 Personal history of other complications of pregnancy, childbirth and the puerperium: Secondary | ICD-10-CM

## 2020-07-10 LAB — POCT URINE PREGNANCY: Preg Test, Ur: NEGATIVE

## 2020-07-10 NOTE — Progress Notes (Signed)
Not taking BP meds any longer. States she wasn't give refills  Late menses Discuss pelvic pain 8/10, constant, takes tylenol

## 2020-07-10 NOTE — Progress Notes (Signed)
  Subjective:    Megan Schneider - 30 y.o. female MRN [id number]  Date of birth: 10/07/1990  HPI  Megan Schneider is here for concerns about pelvic pain. Reports right sided, described as jabbing. Worse with intercourse. Reports has been present for >1 year but has been worsening. Previously was only during sex but now has been more constant. Feels like she is cramping like she is going to have a menstrual cycle but hasn't happened yet. No dysuria, abdominal pain, nausea/vomiting, diarrhea/constipation, vaginal discharge, fevers.      Health Maintenance:  Health Maintenance Due  Topic Date Due  . COVID-19 Vaccine (1) Never done  . Hepatitis C Screening  Never done    -  reports that she has never smoked. She has never used smokeless tobacco. - Review of Systems: Per HPI. - Past Medical History: Patient Active Problem List   Diagnosis Date Noted  . Hemorrhoids during puerperium with postpartum complication 67/20/9470  . Chronic bilateral low back pain without sciatica 01/25/2019  . Gestational hypertension 01/25/2019  . Cannabis hyperemesis syndrome concurrent with and due to cannabis abuse ([place]) 08/02/2018  . Subclinical hyperthyroidism 07/14/2018   - Medications: reviewed and updated   Objective:   Physical Exam BP (!) 152/96   Pulse 72   Temp (!) 97.3 F (36.3 C)   Resp 16   Wt 189 lb (85.7 kg)   LMP 06/06/2020   SpO2 99%   BMI 30.51 kg/m  Physical Exam Constitutional:      General: She is not in acute distress.    Appearance: She is not diaphoretic.  Cardiovascular:     Rate and Rhythm: Normal rate.  Pulmonary:     Effort: Pulmonary effort is normal. No respiratory distress.  Genitourinary:    Comments: GU/GYN: Exam performed in the presence of a chaperone. External genitalia within normal limits.  Vaginal mucosa pink, moist, normal rugae.  Nonfriable cervix without lesions, no discharge or bleeding noted on speculum exam.  Bimanual exam revealed normal,  nongravid uterus.  No cervical motion tenderness. No adnexal masses bilaterally.    Musculoskeletal:        General: Normal range of motion.  Skin:    General: Skin is warm and dry.  Neurological:     Mental Status: She is alert and oriented to person, place, and time.  Psychiatric:        Mood and Affect: Affect normal.        Judgment: Judgment normal.            Assessment & Plan:   1. Pelvic pain Exam is benign. Will send for cultures. Upreg negative. Will obtain pelvic ultrasound to further evaluate.  - Cervicovaginal ancillary only - US Pelvis Complete; Future - POCT urine pregnancy  2. History of gestational hypertension BP is elevated today, may be secondary to pain. She has a history of gHTN but has had previously normal BPs outside of pregnancy. Will plan to monitor as her risk of HTN is higher with her history.    Phill Myron, D.O. 07/10/2020, 10:51 AM Primary Care at [facility]

## 2020-07-14 ENCOUNTER — Other Ambulatory Visit: Payer: Self-pay | Admitting: Internal Medicine

## 2020-07-14 LAB — CERVICOVAGINAL ANCILLARY ONLY
Bacterial Vaginitis (gardnerella): POSITIVE — AB
Candida Glabrata: NEGATIVE
Candida Vaginitis: NEGATIVE
Chlamydia: NEGATIVE
Comment: NEGATIVE
Comment: NEGATIVE
Comment: NEGATIVE
Comment: NEGATIVE
Comment: NEGATIVE
Comment: NORMAL
Neisseria Gonorrhea: NEGATIVE
Trichomonas: NEGATIVE

## 2020-07-14 MED ORDER — METRONIDAZOLE 500 MG PO TABS: 500.0000 mg | ORAL_TABLET | Freq: Two times a day (BID) | ORAL | 0 refills | Status: DC

## 2020-07-16 ENCOUNTER — Other Ambulatory Visit: Payer: Self-pay | Admitting: Internal Medicine

## 2020-07-16 DIAGNOSIS — R102 Pelvic and perineal pain: Secondary | ICD-10-CM

## 2020-07-31 ENCOUNTER — Other Ambulatory Visit: Payer: Medicaid Other

## 2020-08-11 ENCOUNTER — Ambulatory Visit
Admission: RE | Admit: 2020-08-11 | Discharge: 2020-08-11 | Disposition: A | Discharge status: Home / Self Care | Payer: Medicaid Other | Source: Ambulatory Visit | Attending: Internal Medicine | Admitting: Internal Medicine

## 2020-08-11 DIAGNOSIS — G8929 Other chronic pain: Secondary | ICD-10-CM | POA: Diagnosis not present

## 2020-08-11 DIAGNOSIS — R102 Pelvic and perineal pain: Secondary | ICD-10-CM

## 2020-08-12 ENCOUNTER — Telehealth: Payer: Self-pay | Admitting: Internal Medicine

## 2020-08-12 NOTE — Telephone Encounter (Signed)
Pt is calling for her [place] results from 6.28.2022. She was told to call the provider who ordered the test for her results.

## 2020-08-13 ENCOUNTER — Other Ambulatory Visit: Payer: Self-pay | Admitting: Family

## 2020-08-13 DIAGNOSIS — N9489 Other specified conditions associated with female genital organs and menstrual cycle: Secondary | ICD-10-CM

## 2020-08-13 NOTE — Telephone Encounter (Signed)
Ultrasound resulted with pelvic congestion syndrome.   A referral to Gynecology placed. Their office should call patient within 2 weeks with appointment details.

## 2020-09-26 ENCOUNTER — Telehealth: Payer: Medicaid Other | Admitting: Nurse Practitioner

## 2020-09-26 DIAGNOSIS — J029 Acute pharyngitis, unspecified: Secondary | ICD-10-CM | POA: Diagnosis not present

## 2020-09-26 NOTE — Progress Notes (Signed)
  E-Visit for Sore Throat  We are sorry that you are not feeling well.  Here is how we plan to help!  Your symptoms indicate a likely viral infection (Pharyngitis).   Pharyngitis is inflammation in the back of the throat which can cause a sore throat, scratchiness and sometimes difficulty swallowing.   Pharyngitis is typically caused by a respiratory virus and will just run its course.  Please keep in mind that your symptoms could last up to 10 days.  For throat pain, we recommend over the counter oral pain relief medications such as acetaminophen or aspirin, or anti-inflammatory medications such as ibuprofen or naproxen sodium.  Topical treatments such as oral throat lozenges or sprays may be used as needed.  Avoid close contact with loved ones, especially the very young and elderly.  Remember to wash your hands thoroughly throughout the day as this is the number one way to prevent the spread of infection and wipe down door knobs and counters with disinfectant.  After careful review of your answers, I would not recommend and antibiotic for your condition.  Antibiotics should not be used to treat conditions that we suspect are caused by viruses like the virus that causes the common cold or flu. However, some people can have Strep with atypical symptoms. You may need formal testing in clinic or office to confirm if your symptoms continue or worsen.  Providers prescribe antibiotics to treat infections caused by bacteria. Antibiotics are very powerful in treating bacterial infections when they are used properly.  To maintain their effectiveness, they should be used only when necessary.  Overuse of antibiotics has resulted in the development of super bugs that are resistant to treatment!    Home Care: Only take medications as instructed by your medical team. Do not drink alcohol while taking these medications. A steam or ultrasonic humidifier can help congestion.  You can place a towel over your head and  breathe in the steam from hot water coming from a faucet. Avoid close contacts especially the very young and the elderly. Cover your mouth when you cough or sneeze. Always remember to wash your hands.  Get Help Right Away If: You develop worsening fever or throat pain. You develop a severe head ache or visual changes. Your symptoms persist after you have completed your treatment plan.  Make sure you Understand these instructions. Will watch your condition. Will get help right away if you are not doing well or get worse.   Thank you for choosing an e-visit.  Your e-visit answers were reviewed by a board certified advanced clinical practitioner to complete your personal care plan. Depending upon the condition, your plan could have included both over the counter or prescription medications.  Please review your pharmacy choice. Make sure the pharmacy is open so you can pick up prescription now. If there is a problem, you may contact your provider through MyChart messaging and have the prescription routed to another pharmacy.  Your safety is important to us. If you have drug allergies check your prescription carefully.   For the next 24 hours you can use MyChart to ask questions about today's visit, request a non-urgent call back, or ask for a work or school excuse. You will get an email in the next two days asking about your experience. I hope that your e-visit has been valuable and will speed your recovery.  5-10 minutes spent reviewing and documenting in chart.  

## 2020-10-02 ENCOUNTER — Telehealth: Payer: Medicaid Other | Admitting: Physician Assistant

## 2020-10-02 DIAGNOSIS — B9689 Other specified bacterial agents as the cause of diseases classified elsewhere: Secondary | ICD-10-CM

## 2020-10-02 MED ORDER — AMOXICILLIN 500 MG PO CAPS: 500.0000 mg | ORAL_CAPSULE | Freq: Two times a day (BID) | ORAL | 0 refills | Status: AC

## 2020-10-02 NOTE — Progress Notes (Signed)
E-Visit for Sore Throat - Strep Symptoms  We are sorry that you are not feeling well.  Here is how we plan to help!  I do believe that your symptoms are likely still viral, but given the length of time, it would be reasonable to try an antibiotic.  Please see below for more information.  Based on what you have shared with me it is likely that you have strep pharyngitis.  Strep pharyngitis is inflammation and infection in the back of the throat.  This is an infection cause by bacteria and is treated with antibiotics.  I have prescribed Amoxicillin 500 mg twice a day for 10 days. For throat pain, we recommend over the counter oral pain relief medications such as acetaminophen or aspirin, or anti-inflammatory medications such as ibuprofen or naproxen sodium. Topical treatments such as oral throat lozenges or sprays may be used as needed. Strep infections are not as easily transmitted as other respiratory infections, however we still recommend that you avoid close contact with loved ones, especially the very young and elderly.  Remember to wash your hands thoroughly throughout the day as this is the number one way to prevent the spread of infection and wipe down door knobs and counters with disinfectant.   Home Care: Only take medications as instructed by your medical team. Complete the entire course of an antibiotic. Do not take these medications with alcohol. A steam or ultrasonic humidifier can help congestion.  You can place a towel over your head and breathe in the steam from hot water coming from a faucet. Avoid close contacts especially the very young and the elderly. Cover your mouth when you cough or sneeze. Always remember to wash your hands.  Get Help Right Away If: You develop worsening fever or sinus pain. You develop a severe head ache or visual changes. Your symptoms persist after you have completed your treatment plan.  Make sure you Understand these instructions. Will watch your  condition. Will get help right away if you are not doing well or get worse.   Thank you for choosing an e-visit.  Your e-visit answers were reviewed by a board certified advanced clinical practitioner to complete your personal care plan. Depending upon the condition, your plan could have included both over the counter or prescription medications.  Please review your pharmacy choice. Make sure the pharmacy is open so you can pick up prescription now. If there is a problem, you may contact your provider through [employer] and have the prescription routed to another pharmacy.  Your safety is important to [place]. If you have drug allergies check your prescription carefully.   For the next 24 hours you can use MyChart to ask questions about today's visit, request a non-urgent call back, or ask for a work or school excuse. You will get an email in the next two days asking about your experience. I hope that your e-visit has been valuable and will speed your recovery.  Greater than 5 minutes, yet less than 10 minutes of time have been spent researching, coordinating, and implementing care for this patient today

## 2020-11-26 ENCOUNTER — Ambulatory Visit (HOSPITAL_BASED_OUTPATIENT_CLINIC_OR_DEPARTMENT_OTHER): Payer: Medicaid Other | Admitting: Obstetrics & Gynecology

## 2020-11-26 ENCOUNTER — Other Ambulatory Visit: Payer: Self-pay

## 2020-11-26 ENCOUNTER — Encounter (HOSPITAL_BASED_OUTPATIENT_CLINIC_OR_DEPARTMENT_OTHER): Payer: Self-pay | Admitting: Obstetrics & Gynecology

## 2020-11-26 VITALS — BP 155/92 | HR 62 | Ht 67.0 in | Wt 179.8 lb

## 2020-11-26 DIAGNOSIS — O135 Gestational [pregnancy-induced] hypertension without significant proteinuria, complicating the puerperium: Secondary | ICD-10-CM | POA: Diagnosis not present

## 2020-11-26 DIAGNOSIS — M6289 Other specified disorders of muscle: Secondary | ICD-10-CM | POA: Diagnosis not present

## 2020-11-26 DIAGNOSIS — N9489 Other specified conditions associated with female genital organs and menstrual cycle: Secondary | ICD-10-CM | POA: Diagnosis not present

## 2020-11-26 DIAGNOSIS — O139 Gestational [pregnancy-induced] hypertension without significant proteinuria, unspecified trimester: Secondary | ICD-10-CM

## 2020-11-26 MED ORDER — NORETHINDRONE 0.35 MG PO TABS
1.0000 | ORAL_TABLET | Freq: Every day | ORAL | 1 refills | Status: DC
Start: 2020-11-26 — End: 2023-01-18

## 2020-11-26 MED ORDER — NIFEDIPINE ER OSMOTIC RELEASE 30 MG PO TB24: 30.0000 mg | ORAL_TABLET | Freq: Every day | ORAL | 4 refills | Status: DC

## 2020-11-26 NOTE — Progress Notes (Signed)
GYNECOLOGY  VISIT  CC:   discuss recent ultrasound and recommendations  HPI: 30 y.o. [id number] Single Black or African American female here for discussion of recent diagnosis of pelvic venous congestion and possible need for hysterectomy.   Since delivery of last child, has been experiencing dyspareunia with pain down low on the left side.  This feel like a deep pain with a burning sensation at times.  She shared this with her PCP (who is no longer at the office.  Ultrasound was ordered due to the pain.  Images reviewed and results d/w pt. Uterus normal measuring 8 x 6 x 3.5cm.  Endometrium was 49m.  Ovaries are normal.  Pelvic venous congestion noted.  Pt was advised treatment for this is a hysterectomy.  This is why she is here.  She does not want a hysterectomy.  D/w pt diagnosis of pelvic venous congestion and why hysterectomy is the treatment of choice but I feel there are some additional options for her.    Last pap:  07/05/2018. Normal.  Patient Active Problem List   Diagnosis Date Noted   Hemorrhoids during puerperium with postpartum complication 103/21/2248  Chronic bilateral low back pain without sciatica 01/25/2019   Gestational hypertension 01/25/2019   Cannabis hyperemesis syndrome concurrent with and due to cannabis abuse ([place] 08/02/2018   Subclinical hyperthyroidism 07/14/2018    Past Medical History:  Diagnosis Date   Asthma    occasionally, exercise-induced; uses inhaler; not used "in a while"   Hypertension    Marijuana use 08/24/2016   Positive tox screen in Jan 2018   Scoliosis     Past Surgical History:  Procedure Laterality Date   NO PAST SURGERIES      MEDS:   Current Outpatient Medications on File Prior to Visit  Medication Sig Dispense Refill   Blood Pressure KIT Z34.90 To monitored regularly at home Large 1 each 0   No current facility-administered medications on file prior to visit.    ALLERGIES: Patient has no known allergies.  Family History   Problem Relation Age of Onset   Diabetes Father     SH:  single, non smoker  Review of Systems  Constitutional: Negative.   Genitourinary:  Positive for pelvic pain. Negative for difficulty urinating, dysuria, menstrual problem, vaginal discharge and vaginal pain.  All other systems reviewed and are negative.  PHYSICAL EXAMINATION:    BP (!) 155/92   Pulse 62   Ht _0  (1.702 m)   Wt 179 lb 12.8 oz (81.6 kg)   BMI 28.16 kg/m     General appearance: alert, cooperative and appears stated age Lymph:  no inguinal LAD noted  Pelvic: External genitalia:  no lesions              Urethra:  normal appearing urethra with no masses, tenderness or lesions              Bartholins and Skenes: normal                 Vagina: normal appearing vagina with normal color and discharge, no lesions              Cervix: no lesions              Bimanual Exam:  Uterus:  normal size, contour, position, consistency, mobility, non-tender              Adnexa: no mass, fullness, tenderness Pt has pelvic floor pain, L>R, to palpation  Chaperone, Ezekiel Ina, RN, was present for exam.  Assessment/Plan: 1. Pelvic floor dysfunction in female - d/w pt diagnosis and treatment - Ambulatory referral to Physical Therapy  2. Female pelvic congestion syndrome - possible incidental finding - norethindrone (MICRONOR) 0.35 MG tablet; Take 1 tablet (0.35 mg total) by mouth daily.  Dispense: 84 tablet; Refill: 1 - recheck 3 months  3. Gestational hypertension, antepartum - pt aware BP elevated.  Ran out of medication and PCP no longer at practice. - will restart medication.  She is going to monitor BPs.  She is likely going to need to establish care with new PCP - NIFEdipine (PROCARDIA-XL/NIFEDICAL-XL) 30 MG 24 hr tablet; Take 1 tablet (30 mg total) by mouth daily.  Dispense: 30 tablet; Refill: 4

## 2020-11-29 DIAGNOSIS — M6289 Other specified disorders of muscle: Secondary | ICD-10-CM | POA: Insufficient documentation

## 2020-11-29 DIAGNOSIS — N9489 Other specified conditions associated with female genital organs and menstrual cycle: Secondary | ICD-10-CM | POA: Insufficient documentation

## 2020-11-29 DIAGNOSIS — R102 Pelvic and perineal pain: Secondary | ICD-10-CM | POA: Insufficient documentation

## 2020-12-24 ENCOUNTER — Ambulatory Visit: Payer: Medicaid Other | Admitting: Physical Therapy

## 2020-12-29 ENCOUNTER — Other Ambulatory Visit: Payer: Self-pay

## 2020-12-29 ENCOUNTER — Ambulatory Visit: Payer: Medicaid Other | Attending: Obstetrics & Gynecology | Admitting: Physical Therapy

## 2020-12-29 ENCOUNTER — Encounter: Payer: Self-pay | Admitting: Physical Therapy

## 2020-12-29 DIAGNOSIS — R252 Cramp and spasm: Secondary | ICD-10-CM | POA: Insufficient documentation

## 2020-12-29 DIAGNOSIS — M6289 Other specified disorders of muscle: Secondary | ICD-10-CM | POA: Insufficient documentation

## 2020-12-29 DIAGNOSIS — M6281 Muscle weakness (generalized): Secondary | ICD-10-CM | POA: Insufficient documentation

## 2020-12-29 DIAGNOSIS — R279 Unspecified lack of coordination: Secondary | ICD-10-CM | POA: Insufficient documentation

## 2020-12-29 NOTE — Therapy (Signed)
[facility] @ [address] [place], [place], [address] Phone: [phone]   Fax:  [phone]  Physical Therapy Evaluation  Patient Details  Name: Megan Schneider MRN: [id number] Date of Birth: Oct 13, 1990 Referring Provider (PT): Megan Salon, MD   Encounter Date: 12/29/2020   PT End of Session - 12/29/20 1130     Visit Number 1    Number of Visits 27    Date for PT Re-Evaluation 03/31/21    Authorization Type [employer]    Authorization - Number of Visits 27    PT Start Time 1100    PT Stop Time 1140    PT Time Calculation (min) 40 min    Activity Tolerance Patient tolerated treatment well;Patient limited by pain    Behavior During Therapy [facility] for tasks assessed/performed             Past Medical History:  Diagnosis Date   Asthma    occasionally, exercise-induced; uses inhaler; not used "in a while"   Hypertension    started as gestational   Marijuana use 08/24/2016   Positive tox screen in Jan 2018   Scoliosis     Past Surgical History:  Procedure Laterality Date   NO PAST SURGERIES      There were no vitals filed for this visit.    Subjective Assessment - 12/29/20 1103     Subjective Pt reports she has had 2 year history of low back pain with periods as 10/10 for at the least the first 2 days throbbing and Rt side groin pain with intercourse as 10/10 throbbing.    Pertinent History 30 yo and 30 yo both vaginal mild tearing with first child.    How long can you sit comfortably? pt reports she has constant back pain during those days of period and is 10/10 but has to function so just pushes through it    How long can you stand comfortably? pt reports she has constant back pain during those days of period and is 10/10 but has to function so just pushes through it    How long can you walk comfortably? pt reports she has constant back pain during those days of period and is 10/10 but has to function so  just pushes through it    Patient Stated Goals to have to less pain    Currently in Pain? No/denies                [facility] PT Assessment - 12/29/20 0001       Assessment   Medical Diagnosis M62.89 (ICD-10-CM) - Pelvic floor dysfunction in female    Referring Provider (PT) Megan Salon, MD    Onset Date/Surgical Date --   2 years   Prior Therapy for back when she was young      Precautions   Precautions None      Restrictions   Weight Bearing Restrictions No      Balance Screen   Has the patient fallen in the past 6 months No    Has the patient had a decrease in activity level because of a fear of falling?  No    Is the patient reluctant to leave their home because of a fear of falling?  No      Home Ecologist residence    Living Arrangements Children      Prior Function   Level of Independence Independent  Vocation Full time employment    Actor, lifting and moving pts      Cognition   Overall Cognitive Status Within Functional Limits for tasks assessed      Sensation   Light Touch Appears Intact      Coordination   Gross Motor Movements are Fluid and Coordinated Yes    Fine Motor Movements are Fluid and Coordinated Yes      Posture/Postural Control   Posture/Postural Control No significant limitations      ROM / Strength   AROM / PROM / Strength AROM;Strength      AROM   Overall AROM Comments thoracic and lumbar spine limited yb 25% in bil side bending and rotation all others [facility]      Strength   Overall Strength Comments bil hips grossly 4+/5 throughout      Flexibility   Soft Tissue Assessment /Muscle Length yes   hamstrings and adductors bil limited by 25%                       Objective measurements completed on examination: See above findings.     Pelvic Floor Special Questions - 12/29/20 0001     Prior Pelvic/Prostate Exam Yes    Are you Pregnant or attempting pregnancy?  No    Prior Pregnancies Yes    Number of Pregnancies 2    Number of Vaginal Deliveries 2    Any difficulty with labor and deliveries No    Episiotomy Performed No    Currently Sexually Active Yes    Is this Painful Yes    History of sexually transmitted disease No    Marinoff Scale pain prevents any attempts at intercourse    Urinary Leakage No    Urinary urgency Yes    Urinary frequency no    Fecal incontinence No    Fluid intake not 1/2 body weight in oz but does drink a lot    Caffeine beverages yes - 1 cup per day and one-two sodas per day    Falling out feeling (prolapse) No    External Perineal Exam [facility]    Pelvic Floor Internal Exam patient identified and patient confirms consent for PT to perform internal soft tissue work and muscle strength and integrity assessment    Exam Type Vaginal    Sensation WFL    Palpation TTP at Lt side of pelvis throughout but more intense at Lt pubococcygeus and iliococcygeus; Rt Ischiocavernosus and bil obturator internus    Strength good squeeze, good lift, able to hold agaisnt strong resistance    Strength # of reps 8    Strength # of seconds 7    Tone increased                       PT Education - 12/29/20 1128     Education Details Pt educated on exam findings, POC, HEP, pelvic relaxation techniques    Person(s) Educated Patient    Methods Explanation;Demonstration;Tactile cues;Verbal cues;Handout    Comprehension Returned demonstration;Verbalized understanding              PT Short Term Goals - 12/29/20 1143       PT SHORT TERM GOAL #1   Title Pt to be I with HEP    Time 6    Period Weeks    Status New    Target Date 02/09/21      PT SHORT TERM GOAL #2  Title pt to demonstrate consistent ability to contract and fully relax pelvis at least 50% of the time to improve tone of pelvic floor    Baseline 25%    Time 6    Period Weeks    Status New    Target Date 02/09/21      PT SHORT TERM GOAL #3   Title  pt to report no more than 6/10 pain with periods at back pain or 6/10 with pain with intercourse for improved QOL.    Baseline 10/10    Time 6    Period Weeks    Target Date 02/02/21               PT Long Term Goals - 12/29/20 1145       PT LONG TERM GOAL #1   Title Pt to be I with advanced HEP    Baseline new at eval    Time 3    Period Months    Status New    Target Date 03/31/21      PT LONG TERM GOAL #2   Title pt to demonstrate consistent ability to contract and fully relax pelvis at least 75% of the time to improve tone of pelvic floor    Baseline 25%    Time 3    Period Months    Status New    Target Date 03/31/21      PT LONG TERM GOAL #3   Title pt to report no more than 3/10 pain with periods at back pain or 3/10 with pain with intercourse for improved QOL.    Baseline 10/10    Time 3    Period Months    Status New    Target Date 03/31/21      PT LONG TERM GOAL #4   Title pt to demonstrate ability to complete lifting 20# from ground level with good mechanics to decrease risk of injury and worsening pelvic/back pain.    Baseline body weight with impaired mechanics.    Time 3    Period Months    Status New    Target Date 03/31/21                    Plan - 12/29/20 1138     Clinical Impression Statement Pt is 30yo female presenting to clinic with 2 year h/o pelvic pain, Rt groin pain with intercourse, and severe low back pain during periods that is 10/10. Pain with intercourse is also 10/10 and pt reports she has noticed increased dryness and need of lubricant as well. Pt has h/o 2 vaginal births with mild tearing with first (5 and 30 yo). Pt had listing of pelvic venous congestion per chart review but pt unsure about this during evaluation. Pt demonstrated mild decreased mobility at thoraic and lumbar spine and hips, 4+/5 hip strength bil in all directions and no TTP externally. Pt consented to internal vaginal assessment, found to have TTP  throughout pelvis but Lt more so than Rt: Lt pubococcygeus and iliococcygeus; Rt Ischiocavernosus and bil obturator internus with increased tone throughout. Pt also found to have mildly weak pelvic floor and decreased coordination. Pt would benefit from PT to address deficits noted during evaluation and improve QOL.    Personal Factors and Comorbidities Comorbidity 1;Time since onset of injury/illness/exacerbation    Comorbidities x2 vaginal births    Examination-Participation Restrictions Community Activity;Interpersonal Relationship    Stability/Clinical Decision Making Stable/Uncomplicated    Clinical Decision Making Low  Rehab Potential Good    PT Frequency 1x / week    PT Duration Other (comment)   10 weeks   PT Treatment/Interventions ADLs/Self Care Home Management;Aquatic Therapy;Functional mobility training;Therapeutic activities;Therapeutic exercise;Neuromuscular re-education;Manual techniques;Patient/family education;Taping;Scar mobilization;Passive range of motion;Energy conservation    PT Next Visit Plan go over HEP, pelvic relaxation techniques    PT Home Exercise Plan [id number]    Consulted and Agree with Plan of Care Patient             Patient will benefit from skilled therapeutic intervention in order to improve the following deficits and impairments:  Decreased coordination, Decreased endurance, Impaired tone, Pain, Decreased mobility, Decreased strength, Impaired sensation, Improper body mechanics, Impaired flexibility  Visit Diagnosis: Cramp and spasm - Plan: PT plan of care cert/re-cert  Muscle weakness (generalized) - Plan: PT plan of care cert/re-cert  Lack of coordination - Plan: PT plan of care cert/re-cert     Problem List Patient Active Problem List   Diagnosis Date Noted   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 Patient is motivated to learn. Patient understands and agrees with treatment goals and plan. PT explains patient will be examined in standing, sitting, and lying down to see how their muscles and joints work. When they are ready, they will be asked to remove their underwear so PT can examine their perineum. The patient is also given the option of providing their own chaperone as one is not provided in our facility. The patient also has the right and is explained the right to defer or refuse any part of the evaluation or treatment including the internal exam. With the patient's consent, PT will use one gloved finger to gently assess the muscles of the pelvic floor, seeing how well it contracts and relaxes and if there is muscle symmetry. After, the patient will get dressed and PT and patient will discuss exam findings and plan of care. PT and patient discuss plan of care, schedule, attendance policy and HEP activities.  Stacy Gardner, PT, DPT 12/29/2209:48 AM   [facility] @ [address] [place], [place], [address] Phone: [phone]   Fax:  [phone]  Name: Tomara Youngberg MRN: [id number] Date of Birth: July 28, 1990

## 2021-01-08 ENCOUNTER — Telehealth: Payer: Medicaid Other | Admitting: Nurse Practitioner

## 2021-01-08 DIAGNOSIS — N760 Acute vaginitis: Secondary | ICD-10-CM | POA: Diagnosis not present

## 2021-01-08 DIAGNOSIS — B9689 Other specified bacterial agents as the cause of diseases classified elsewhere: Secondary | ICD-10-CM

## 2021-01-08 MED ORDER — METRONIDAZOLE 500 MG PO TABS: 500.0000 mg | ORAL_TABLET | Freq: Two times a day (BID) | ORAL | 0 refills | Status: AC

## 2021-01-08 NOTE — Progress Notes (Signed)
E-Visit for Vaginal Symptoms  We are sorry that you are not feeling well. Here is how we plan to help! Based on what you shared with me it looks like you: May have a vaginosis due to bacteria  Vaginosis is an inflammation of the vagina that can result in discharge, itching and pain. The cause is usually a change in the normal balance of vaginal bacteria or an infection. Vaginosis can also result from reduced estrogen levels after menopause.  The most common causes of vaginosis are:   Bacterial vaginosis which results from an overgrowth of one on several organisms that are normally present in your vagina.   Yeast infections which are caused by a naturally occurring fungus called candida.   Vaginal atrophy (atrophic vaginosis) which results from the thinning of the vagina from reduced estrogen levels after menopause.   Trichomoniasis which is caused by a parasite and is commonly transmitted by sexual intercourse.  Factors that increase your risk of developing vaginosis include: Medications, such as antibiotics and steroids Uncontrolled diabetes Use of hygiene products such as bubble bath, vaginal spray or vaginal deodorant Douching Wearing damp or tight-fitting clothing Using an intrauterine device (IUD) for birth control Hormonal changes, such as those associated with pregnancy, birth control pills or menopause Sexual activity Having a sexually transmitted infection  Your treatment plan is Metronidazole or Flagyl 500mg twice a day for 7 days.  I have electronically sent this prescription into the pharmacy that you have chosen.  Be sure to take all of the medication as directed. Stop taking any medication if you develop a rash, tongue swelling or shortness of breath. Mothers who are breast feeding should consider pumping and discarding their breast milk while on these antibiotics. However, there is no consensus that infant exposure at these doses would be harmful.  Remember that  medication creams can weaken latex condoms. .   HOME CARE:  Good hygiene may prevent some types of vaginosis from recurring and may relieve some symptoms:  Avoid baths, hot tubs and whirlpool spas. Rinse soap from your outer genital area after a shower, and dry the area well to prevent irritation. Don't use scented or harsh soaps, such as those with deodorant or antibacterial action. Avoid irritants. These include scented tampons and pads. Wipe from front to back after using the toilet. Doing so avoids spreading fecal bacteria to your vagina.  Other things that may help prevent vaginosis include:  Don't douche. Your vagina doesn't require cleansing other than normal bathing. Repetitive douching disrupts the normal organisms that reside in the vagina and can actually increase your risk of vaginal infection. Douching won't clear up a vaginal infection. Use a latex condom. Both female and female latex condoms may help you avoid infections spread by sexual contact. Wear cotton underwear. Also wear pantyhose with a cotton crotch. If you feel comfortable without it, skip wearing underwear to bed. Yeast thrives in moist environments Your symptoms should improve in the next day or two.  GET HELP RIGHT AWAY IF:  You have pain in your lower abdomen ( pelvic area or over your ovaries) You develop nausea or vomiting You develop a fever Your discharge changes or worsens You have persistent pain with intercourse You develop shortness of breath, a rapid pulse, or you faint.  These symptoms could be signs of problems or infections that need to be evaluated by a medical provider now.  MAKE SURE YOU   Understand these instructions. Will watch your condition. Will get help right   away if you are not doing well or get worse.  Thank you for choosing an e-visit.  Your e-visit answers were reviewed by a board certified advanced clinical practitioner to complete your personal care plan. Depending upon the  condition, your plan could have included both over the counter or prescription medications.  Please review your pharmacy choice. Make sure the pharmacy is open so you can pick up prescription now. If there is a problem, you may contact your provider through [employer] and have the prescription routed to another pharmacy.  Your safety is important to [place]. If you have drug allergies check your prescription carefully.   For the next 24 hours you can use MyChart to ask questions about today's visit, request a non-urgent call back, or ask for a work or school excuse. You will get an email in the next two days asking about your experience. I hope that your e-visit has been valuable and will speed your recovery.  Meds ordered this encounter  Medications   metroNIDAZOLE (FLAGYL) 500 MG tablet    Sig: Take 1 tablet (500 mg total) by mouth 2 (two) times daily for 7 days. Avoid alcohol while taking this medication    Dispense:  14 tablet    Refill:  0     I spent approximately 10 minutes reviewing the patient's history, current symptoms and coordinating their care today.

## 2021-01-21 ENCOUNTER — Ambulatory Visit: Payer: Medicaid Other | Attending: Obstetrics & Gynecology | Admitting: Physical Therapy

## 2021-01-21 DIAGNOSIS — R252 Cramp and spasm: Secondary | ICD-10-CM | POA: Insufficient documentation

## 2021-01-21 DIAGNOSIS — R279 Unspecified lack of coordination: Secondary | ICD-10-CM | POA: Insufficient documentation

## 2021-01-21 DIAGNOSIS — M6289 Other specified disorders of muscle: Secondary | ICD-10-CM | POA: Insufficient documentation

## 2021-01-21 DIAGNOSIS — M6281 Muscle weakness (generalized): Secondary | ICD-10-CM | POA: Insufficient documentation

## 2021-02-03 ENCOUNTER — Ambulatory Visit (HOSPITAL_BASED_OUTPATIENT_CLINIC_OR_DEPARTMENT_OTHER): Payer: Medicaid Other | Admitting: Obstetrics & Gynecology

## 2021-02-03 ENCOUNTER — Ambulatory Visit (HOSPITAL_BASED_OUTPATIENT_CLINIC_OR_DEPARTMENT_OTHER): Payer: Medicaid Other | Admitting: Nurse Practitioner

## 2021-02-03 ENCOUNTER — Ambulatory Visit (HOSPITAL_BASED_OUTPATIENT_CLINIC_OR_DEPARTMENT_OTHER): Payer: Medicaid Other | Admitting: Family Medicine

## 2021-02-04 ENCOUNTER — Encounter: Payer: Medicaid Other | Admitting: Physical Therapy

## 2021-02-04 ENCOUNTER — Ambulatory Visit (HOSPITAL_BASED_OUTPATIENT_CLINIC_OR_DEPARTMENT_OTHER): Payer: Medicaid Other | Admitting: Obstetrics & Gynecology

## 2021-02-14 ENCOUNTER — Telehealth: Payer: Medicaid Other | Admitting: Physician Assistant

## 2021-02-14 DIAGNOSIS — E282 Polycystic ovarian syndrome: Secondary | ICD-10-CM

## 2021-02-14 DIAGNOSIS — N946 Dysmenorrhea, unspecified: Secondary | ICD-10-CM | POA: Diagnosis not present

## 2021-02-14 DIAGNOSIS — G8929 Other chronic pain: Secondary | ICD-10-CM | POA: Diagnosis not present

## 2021-02-14 DIAGNOSIS — M545 Low back pain, unspecified: Secondary | ICD-10-CM | POA: Diagnosis not present

## 2021-02-14 MED ORDER — IBUPROFEN 800 MG PO TABS: 800.0000 mg | ORAL_TABLET | Freq: Three times a day (TID) | ORAL | 0 refills | Status: DC | PRN

## 2021-02-14 NOTE — Progress Notes (Signed)
We are sorry that you are not feeling well.  Here is how we plan to help!  Based on what you have shared with me it looks like you mostly have acute back pain.  Acute back pain is defined as musculoskeletal pain that can resolve in 1-3 weeks with conservative treatment.  I have prescribed Ibuprofen 800mg  Take 1 tablet three times daily as needed; a non-steroid anti-inflammatory (NSAID). Some patients experience stomach irritation or in increased heartburn with anti-inflammatory drugs.  Please keep in mind that muscle relaxer's can cause fatigue and should not be taken while at work or driving.  Back pain is very common.  The pain often gets better over time.  The cause of back pain is usually not dangerous.  Most people can learn to manage their back pain on their own.  Home Care Stay active.  Start with short walks on flat ground if you can.  Try to walk farther each day. Do not sit, drive or stand in one place for more than 30 minutes.  Do not stay in bed. Do not avoid exercise or work.  Activity can help your back heal faster. Be careful when you bend or lift an object.  Bend at your knees, keep the object close to you, and do not twist. Sleep on a firm mattress.  Lie on your side, and bend your knees.  If you lie on your back, put a pillow under your knees. Only take medicines as told by your doctor. Put ice on the injured area. Put ice in a plastic bag Place a towel between your skin and the bag Leave the ice on for 15-20 minutes, 3-4 times a day for the first 2-3 days. 210 After that, you can switch between ice and heat packs. Ask your doctor about back exercises or massage. Avoid feeling anxious or stressed.  Find good ways to deal with stress, such as exercise.  Get Help Right Way If: Your pain does not go away with rest or medicine. Your pain does not go away in 1 week. You have new problems. You do not feel well. The pain spreads into your legs. You cannot control when you poop  (bowel movement) or pee (urinate) You feel sick to your stomach (nauseous) or throw up (vomit) You have belly (abdominal) pain. You feel like you may pass out (faint). If you develop a fever.  Make Sure you: Understand these instructions. Will watch your condition Will get help right away if you are not doing well or get worse.  Your e-visit answers were reviewed by a board certified advanced clinical practitioner to complete your personal care plan.  Depending on the condition, your plan could have included both over the counter or prescription medications.  If there is a problem please reply  once you have received a response from your provider.  Your safety is important to [place].  If you have drug allergies check your prescription carefully.    You can use MyChart to ask questions about todays visit, request a non-urgent call back, or ask for a work or school excuse for 24 hours related to this e-Visit. If it has been greater than 24 hours you will need to follow up with your provider, or enter a new e-Visit to address those concerns.  You will get an e-mail in the next two days asking about your experience.  I hope that your e-visit has been valuable and will speed your recovery. Thank you for using e-visits.  I provided 5 minutes of non face-to-face time during this encounter for chart review and documentation.

## 2021-02-18 ENCOUNTER — Other Ambulatory Visit: Payer: Self-pay

## 2021-02-18 ENCOUNTER — Ambulatory Visit: Payer: Medicaid Other | Attending: Obstetrics & Gynecology | Admitting: Physical Therapy

## 2021-02-18 DIAGNOSIS — M6281 Muscle weakness (generalized): Secondary | ICD-10-CM | POA: Diagnosis not present

## 2021-02-18 DIAGNOSIS — R279 Unspecified lack of coordination: Secondary | ICD-10-CM | POA: Insufficient documentation

## 2021-02-18 DIAGNOSIS — R252 Cramp and spasm: Secondary | ICD-10-CM | POA: Insufficient documentation

## 2021-02-18 NOTE — Therapy (Addendum)
[facility] @ [place], [place], [address] Phone: [phone]   Fax:  [phone]  Physical Therapy Treatment  Patient Details  Name: Megan Schneider MRN: [id number] Date of Birth: [birth year] Referring Provider (PT): Megan Salon, MD   Encounter Date: 02/18/2021   PT End of Session - 02/18/21 1142     Visit Number 2    Number of Visits 27    Date for PT Re-Evaluation 03/31/21    Authorization Type [employer]    Authorization - Number of Visits 27    PT Start Time 1104    PT Stop Time 1145    PT Time Calculation (min) 41 min    Activity Tolerance Patient tolerated treatment well;Patient limited by pain    Behavior During Therapy [facility] for tasks assessed/performed             Past Medical History:  Diagnosis Date   Asthma    occasionally, exercise-induced; uses inhaler; not used "in a while"   Hypertension    started as gestational   Marijuana use 08/24/2016   Positive tox screen in Jan 2018   Scoliosis     Past Surgical History:  Procedure Laterality Date   NO PAST SURGERIES      There were no vitals filed for this visit.   Subjective Assessment - 02/18/21 1106     Subjective Pt reports she is on her period now 10/10 pain in low back and bil hips; last months period 0/10 with pt reporting she was shocked at not having pain, pain isolated to cycle with usually pain being worst at day 1-2 of period.    Pertinent History 30 yo and 30 yo both vaginal mild tearing with first child.    How long can you sit comfortably? pt reports she has constant back pain during those days of period and is 10/10 but has to function so just pushes through it    How long can you stand comfortably? pt reports she has constant back pain during those days of period and is 10/10 but has to function so just pushes through it    How long can you walk comfortably? pt reports she has constant back pain during those days of  period and is 10/10 but has to function so just pushes through it    Patient Stated Goals to have to less pain    Currently in Pain? No/denies                               [facility] Adult PT Treatment/Exercise - 02/18/21 0001       Self-Care   Self-Care Other Self-Care Comments    Other Self-Care Comments  Pt educated on HEP and updated exercises for improved technique and understanding      Exercises   Exercises Lumbar;Knee/Hip      Lumbar Exercises: Standing   Functional Squats 10 reps    Functional Squats Limitations 10# kettlebell    Other Standing Lumbar Exercises mario punches #5DB x10 each    Other Standing Lumbar Exercises palloffs green band x10 each      Lumbar Exercises: Seated   Other Seated Lumbar Exercises pelvic tilts x10    Other Seated Lumbar Exercises opp arm/knee press with exhale x10 each      Lumbar Exercises: Quadruped   Madcat/Old Horse 10 reps  PT Education - 02/18/21 1141     Education Details Pt educated on pelvic relaxation, stretching and updated HEP, breathing mechanics    Person(s) Educated Patient    Methods Explanation;Demonstration;Tactile cues;Verbal cues;Handout    Comprehension Returned demonstration              PT Short Term Goals - 12/29/20 1143       PT SHORT TERM GOAL #1   Title Pt to be I with HEP    Time 6    Period Weeks    Status New    Target Date 02/09/21      PT SHORT TERM GOAL #2   Title pt to demonstrate consistent ability to contract and fully relax pelvis at least 50% of the time to improve tone of pelvic floor    Baseline 25%    Time 6    Period Weeks    Status New    Target Date 02/09/21      PT SHORT TERM GOAL #3   Title pt to report no more than 6/10 pain with periods at back pain or 6/10 with pain with intercourse for improved QOL.    Baseline 10/10    Time 6    Period Weeks    Target Date 02/02/21               PT Long Term Goals -  12/29/20 1145       PT LONG TERM GOAL #1   Title Pt to be I with advanced HEP    Baseline new at eval    Time 3    Period Months    Status New    Target Date 03/31/21      PT LONG TERM GOAL #2   Title pt to demonstrate consistent ability to contract and fully relax pelvis at least 75% of the time to improve tone of pelvic floor    Baseline 25%    Time 3    Period Months    Status New    Target Date 03/31/21      PT LONG TERM GOAL #3   Title pt to report no more than 3/10 pain with periods at back pain or 3/10 with pain with intercourse for improved QOL.    Baseline 10/10    Time 3    Period Months    Status New    Target Date 03/31/21      PT LONG TERM GOAL #4   Title pt to demonstrate ability to complete lifting 20# from ground level with good mechanics to decrease risk of injury and worsening pelvic/back pain.    Baseline body weight with impaired mechanics.    Time 3    Period Months    Status New    Target Date 03/31/21                   Plan - 02/18/21 1142     Clinical Impression Statement Pt presents to clinic reporting she had improved pain last month but with this recent period pain was 10/10 at low back and bil hips, no pain today but worst with day 1-2 of period and she is currently at the end. Pt session focused on education of HEP as pt had questions about technique and  hip and core strengthening and stretching for improved mobility and decreased pain levels. Pt reported improved understanding of HEP and techniques to try for pain relief with stretching and breathing as needed. Pt tolerated  well Pt would benefit from PT to address deficits noted during evaluation and improve QOL.    Personal Factors and Comorbidities Comorbidity 1;Time since onset of injury/illness/exacerbation    Comorbidities x2 vaginal births    Examination-Participation Restrictions Community Activity;Interpersonal Relationship    Stability/Clinical Decision Making  Stable/Uncomplicated    Rehab Potential Good    PT Frequency 1x / week    PT Duration Other (comment)   10 weeks   PT Treatment/Interventions ADLs/Self Care Home Management;Aquatic Therapy;Functional mobility training;Therapeutic activities;Therapeutic exercise;Neuromuscular re-education;Manual techniques;Patient/family education;Taping;Scar mobilization;Passive range of motion;Energy conservation    PT Next Visit Plan hip/core strengthening/stretching with coordination of breathing and PF vs internal per pt's needs    PT Home Exercise Plan [id number]    Consulted and Agree with Plan of Care Patient             Patient will benefit from skilled therapeutic intervention in order to improve the following deficits and impairments:  Decreased coordination, Decreased endurance, Impaired tone, Pain, Decreased mobility, Decreased strength, Impaired sensation, Improper body mechanics, Impaired flexibility  Visit Diagnosis: Muscle weakness (generalized)  Lack of coordination  Cramp and spasm     Problem List Patient Active Problem List   Diagnosis Date Noted   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   Pelvic floor dysfunction in female 11/29/2020   Hemorrhoids during puerperium with postpartum complication 62/83/1517   Chronic bilateral low back pain without sciatica 01/25/2019   Gestational hypertension 01/25/2019   Cannabis hyperemesis syndrome concurrent with and due to cannabis abuse ([place]) 08/02/2018   Subclinical hyperthyroidism 07/14/2018   Stacy Gardner, PT, DPT 02/18/2309:46 AM    PHYSICAL THERAPY DISCHARGE SUMMARY  Visits from Start of Care: 2  Current functional level related to goals / functional outcomes: Unable to formally reassess as pt requesting DC due to change in job status and child care.    Remaining deficits: Unable to formally reassess as pt requesting DC due to change in job status and child care.    Education / Equipment: HEP   Patient agrees  to discharge. Patient goals were not met. Patient is being discharged due to the patient's request.  Stacy Gardner, PT, DPT 02/16/239:48 AM  [facility] @ [place], [place], [address] Phone: [phone]   Fax:  [phone]  Name: Megan Schneider MRN: [id number] Date of Birth: [birth year]

## 2021-02-22 ENCOUNTER — Encounter: Payer: Medicaid Other | Admitting: Physical Therapy

## 2021-03-01 ENCOUNTER — Encounter (HOSPITAL_BASED_OUTPATIENT_CLINIC_OR_DEPARTMENT_OTHER): Payer: Self-pay | Admitting: Nurse Practitioner

## 2021-03-01 ENCOUNTER — Ambulatory Visit (HOSPITAL_BASED_OUTPATIENT_CLINIC_OR_DEPARTMENT_OTHER): Payer: Medicaid Other | Admitting: Nurse Practitioner

## 2021-03-01 ENCOUNTER — Other Ambulatory Visit: Payer: Self-pay

## 2021-03-01 VITALS — BP 132/78 | HR 62 | Temp 98.7°F | Resp 12 | Ht 66.75 in | Wt 180.8 lb

## 2021-03-01 DIAGNOSIS — J4520 Mild intermittent asthma, uncomplicated: Secondary | ICD-10-CM | POA: Diagnosis not present

## 2021-03-01 DIAGNOSIS — I10 Essential (primary) hypertension: Secondary | ICD-10-CM | POA: Diagnosis not present

## 2021-03-01 DIAGNOSIS — E282 Polycystic ovarian syndrome: Secondary | ICD-10-CM

## 2021-03-01 DIAGNOSIS — F422 Mixed obsessional thoughts and acts: Secondary | ICD-10-CM | POA: Diagnosis not present

## 2021-03-01 DIAGNOSIS — F411 Generalized anxiety disorder: Secondary | ICD-10-CM | POA: Diagnosis not present

## 2021-03-01 DIAGNOSIS — Z0000 Encounter for general adult medical examination without abnormal findings: Secondary | ICD-10-CM

## 2021-03-01 DIAGNOSIS — L723 Sebaceous cyst: Secondary | ICD-10-CM | POA: Diagnosis not present

## 2021-03-01 DIAGNOSIS — L0591 Pilonidal cyst without abscess: Secondary | ICD-10-CM | POA: Insufficient documentation

## 2021-03-01 LAB — CBC WITH DIFFERENTIAL/PLATELET
Basophils Absolute: 0.1 10*3/uL (ref 0.0–0.2)
Basos: 2 %
EOS (ABSOLUTE): 0.1 10*3/uL (ref 0.0–0.4)
Eos: 3 %
Hematocrit: 41.3 % (ref 34.0–46.6)
Hemoglobin: 13.9 g/dL (ref 11.1–15.9)
Immature Grans (Abs): 0 10*3/uL (ref 0.0–0.1)
Immature Granulocytes: 0 %
Lymphocytes Absolute: 1.6 10*3/uL (ref 0.7–3.1)
Lymphs: 40 %
MCH: 29.8 pg (ref 26.6–33.0)
MCHC: 33.7 g/dL (ref 31.5–35.7)
MCV: 89 fL (ref 79–97)
Monocytes Absolute: 0.4 10*3/uL (ref 0.1–0.9)
Monocytes: 11 %
Neutrophils Absolute: 1.8 10*3/uL (ref 1.4–7.0)
Neutrophils: 44 %
Platelets: 232 10*3/uL (ref 150–450)
RBC: 4.66 x10E6/uL (ref 3.77–5.28)
RDW: 12.7 % (ref 11.7–15.4)
WBC: 4.1 10*3/uL (ref 3.4–10.8)

## 2021-03-01 LAB — COMPREHENSIVE METABOLIC PANEL
ALT: 9 IU/L (ref 0–32)
AST: 15 IU/L (ref 0–40)
Albumin/Globulin Ratio: 1.6 (ref 1.2–2.2)
Albumin: 4.5 g/dL (ref 3.9–5.0)
Alkaline Phosphatase: 86 IU/L (ref 44–121)
BUN/Creatinine Ratio: 9 (ref 9–23)
BUN: 8 mg/dL (ref 6–20)
Bilirubin Total: 0.3 mg/dL (ref 0.0–1.2)
CO2: 22 mmol/L (ref 20–29)
Calcium: 9.5 mg/dL (ref 8.7–10.2)
Chloride: 106 mmol/L (ref 96–106)
Creatinine, Ser: 0.9 mg/dL (ref 0.57–1.00)
Globulin, Total: 2.9 g/dL (ref 1.5–4.5)
Glucose: 96 mg/dL (ref 70–99)
Potassium: 4.1 mmol/L (ref 3.5–5.2)
Sodium: 142 mmol/L (ref 134–144)
Total Protein: 7.4 g/dL (ref 6.0–8.5)
eGFR: 89 mL/min/{1.73_m2} (ref >59)

## 2021-03-01 LAB — LIPID PANEL
Chol/HDL Ratio: 3.8 ratio (ref 0.0–4.4)
Cholesterol, Total: 191 mg/dL (ref 100–199)
HDL: 50 mg/dL (ref >39)
LDL Chol Calc (NIH): 131 mg/dL — ABNORMAL HIGH (ref 0–99)
Triglycerides: 53 mg/dL (ref 0–149)
VLDL Cholesterol Cal: 10 mg/dL (ref 5–40)

## 2021-03-01 LAB — HEMOGLOBIN A1C
Est. average glucose Bld gHb Est-mCnc: 108 mg/dL
Hgb A1c MFr Bld: 5.4 % (ref 4.8–5.6)

## 2021-03-01 MED ORDER — SERTRALINE HCL 50 MG PO TABS: 50.0000 mg | ORAL_TABLET | Freq: Every day | ORAL | 3 refills | Status: DC

## 2021-03-01 MED ORDER — ALBUTEROL SULFATE HFA 108 (90 BASE) MCG/ACT IN AERS: 1.0000 | INHALATION_SPRAY | RESPIRATORY_TRACT | 5 refills | Status: AC | PRN

## 2021-03-01 MED ORDER — BUSPIRONE HCL 5 MG PO TABS: 5.0000 mg | ORAL_TABLET | Freq: Three times a day (TID) | ORAL | 11 refills | Status: DC | PRN

## 2021-03-01 MED ORDER — NIFEDIPINE ER OSMOTIC RELEASE 30 MG PO TB24: 30.0000 mg | ORAL_TABLET | Freq: Every day | ORAL | 3 refills | Status: DC

## 2021-03-01 NOTE — Assessment & Plan Note (Signed)
Currently on OCP.  Not sexually active at this time.  Appears to be controlled on current regimen.  Seeing GYN next week for new pt visit. Will follow

## 2021-03-01 NOTE — Assessment & Plan Note (Deleted)
Firm nodularity noted to the medial aspect of the left breast with dark punctum noted in central location.  Symptoms consistent with pilonidal cyst.  Recommend

## 2021-03-01 NOTE — Assessment & Plan Note (Signed)
Symptoms and presentation consistent with GAD with obsessive thoughts present. She does have a lot of stressors caring for two small children alone and working full time.   Recommend sertraline 50mg  once a day for maintenance medication to help best control symptoms. Will also send buspar for PRN use up to three times a day to help with panic symptoms that may present.  No alarm symptoms present today. Will monitor how she is doing when she comes back for cyst removal.

## 2021-03-01 NOTE — Assessment & Plan Note (Addendum)
Well controlled on current regimen No alarm symptoms Will monitor labs today and make changes to care based on findings as necessary.  1 year refills provided

## 2021-03-01 NOTE — Progress Notes (Signed)
Orma Render, DNP, AGNP-c Primary Care & Sports Medicine [address]   [place], [place] [address] [phone]  New patient visit   Patient: Megan Schneider   DOB: November 12, 1990   31 y.o. Female  MRN: [id number] Visit Date: 03/01/2021  Patient Care Team: Tallula Grindle, Coralee Pesa, NP as PCP - General (Nurse Practitioner)  Today's healthcare provider: Orma Render, NP   Chief Complaint  Patient presents with   Establish Care   Subjective    HPI  Megan Schneider is a 31 y.o. female who presents today as a new patient to establish care.  She has two children at home, ages 65 (daughter) and 63 (son) years. She is a single parent and works as a Quarry manager at a [employer]. She endorses family support from her mother, who will keep the kids on the weekends when she works, but otherwise she is raising the children alone. The father of the children is unable to be active at this time due to incarceration.     HTN Well controlled. Started gestational and continued post partum.  Taking medication as prescribed with no side effects.  No CP, palpitations, ShOB, edema, HA, dizziness.   Lesion on Breast First noticed about 6 months ago. Firm nodule on medial aspect of left breast.  Is not draining, painful, red, or warm. She did try to squeeze it but nothing came out.  Endorses that it is just bothersome being present, but no significant concerns.  No other changes to breasts or skin  PCOS Periods are regular with birth control.  No concerns at this time  Anxiety Endorses a lifelong history of anxiety symptoms including habitual tendencies such as touching door knobs certain times, etc. Reports symptoms have changed over the years to more of habitual or obsessive thoughts. She tells me an example of seeing a notification for something coming in the mail and she is unable to stop worrying about what it is or what it is for throughout the day.  She reports these  symptoms are present at least three times a week.  She tells me that she at times will feel shortness of breath during these periods and this triggers an asthma attack. She is unable to calm herself during these times for several hours.  She reports it does affect her daily life.    Asthma otherwise well controlled.  She is scheduled to see Dr. Sabra Heck for GYN care next Monday  Past Medical History:  Diagnosis Date   Asthma    occasionally, exercise-induced; uses inhaler; not used "in a while"   Cannabis hyperemesis syndrome concurrent with and due to cannabis abuse ([place]) 08/02/2018   Gestational hypertension 01/25/2019   Hemorrhoids during puerperium with postpartum complication 29/92/4268   Hypertension    started as gestational   Marijuana use 08/24/2016   Positive tox screen in Jan 2018   Scoliosis    Past Surgical History:  Procedure Laterality Date   NO PAST SURGERIES     Family Status  Relation Name Status   Mother  Alive   Father  Deceased   Sister  Alive   Brother  Alive   MGM  Alive   MGF  Deceased   PGM  Alive   PGF  Alive   Sister  Alive   Brother  Alive   Family History  Problem Relation Age of Onset   Diabetes Father    Social History   Socioeconomic History   Marital  status: Single    Spouse name: Not on file   Number of children: 2   Years of education: Not on file   Highest education level: Not on file  Occupational History   Occupation: [employer]    Comment: CNA  Tobacco Use   Smoking status: Never   Smokeless tobacco: Never  Vaping Use   Vaping Use: Never used  Substance and Sexual Activity   Alcohol use: No    Alcohol/week: 0.0 standard drinks    Comment: occasionally   Drug use: Yes    Types: Marijuana    Comment: reported last time was when "[redacted] weeks pregnant"   Sexual activity: Not Currently    Birth control/protection: None  Other Topics Concern   Not on file  Social History Narrative   Not on file   Social Determinants of  Health   Financial Resource Strain: Low Risk    Difficulty of Paying Living Expenses: Not very hard  Food Insecurity: No Food Insecurity   Worried About Charity fundraiser in the Last Year: Never true   Ran Out of Food in the Last Year: Never true  Transportation Needs: No Transportation Needs   Lack of Transportation (Medical): No   Lack of Transportation (Non-Medical): No  Physical Activity: Sufficiently Active   Days of Exercise per Week: 7 days   Minutes of Exercise per Session: 30 min  Stress: Stress Concern Present   Feeling of Stress : Very much  Social Connections: Moderately Isolated   Frequency of Communication with Friends and Family: More than three times a week   Frequency of Social Gatherings with Friends and Family: More than three times a week   Attends Religious Services: More than 4 times per year   Active Member of Clubs or Organizations: No   Attends Archivist Meetings: Never   Marital Status: Never married   Outpatient Medications Prior to Visit  Medication Sig   norethindrone (MICRONOR) 0.35 MG tablet Take 1 tablet (0.35 mg total) by mouth daily.   [DISCONTINUED] ibuprofen (ADVIL) 800 MG tablet Take 1 tablet (800 mg total) by mouth every 8 (eight) hours as needed.   [DISCONTINUED] NIFEdipine (PROCARDIA-XL/NIFEDICAL-XL) 30 MG 24 hr tablet Take 1 tablet (30 mg total) by mouth daily.   Blood Pressure KIT Z34.90 To monitored regularly at home Large   No facility-administered medications prior to visit.   No Known Allergies  Immunization History  Administered Date(s) Administered   Influenza,inj,Quad PF,6+ Mos 10/29/2018   PPD Test 10/14/2014   Tdap 06/29/2016    Health Maintenance  Topic Date Due   Pneumococcal Vaccine 66-15 Years old (1 - PCV) Never done   Hepatitis C Screening  Never done   INFLUENZA VACCINE  09/14/2020   PAP-Cervical Cytology Screening  07/04/2021   PAP SMEAR-Modifier  07/04/2021   TETANUS/TDAP  06/30/2026   HIV  Screening  Completed   HPV VACCINES  Aged Out    Patient Care Team: Kristel Durkee, Coralee Pesa, NP as PCP - General (Nurse Practitioner)  Review of Systems All review of systems negative except what is listed in the HPI   Objective    BP 132/78    Pulse 62    Temp 98.7 F (37.1 C)    Resp 12    Ht 5' 6.75" (1.695 m)    Wt 180 lb 12.8 oz (82 kg)    LMP 02/20/2021    BMI 28.53 kg/m  Physical Exam Vitals and nursing note reviewed.  Constitutional:      General: She is not in acute distress.    Appearance: Normal appearance.  HENT:     Head: Normocephalic and atraumatic.     Right Ear: Hearing, tympanic membrane, ear canal and external ear normal.     Left Ear: Hearing, tympanic membrane, ear canal and external ear normal.     Nose: Nose normal.     Right Sinus: No maxillary sinus tenderness or frontal sinus tenderness.     Left Sinus: No maxillary sinus tenderness or frontal sinus tenderness.     Mouth/Throat:     Lips: Pink.     Mouth: Mucous membranes are moist.     Pharynx: Oropharynx is clear.  Eyes:     General: Lids are normal. Vision grossly intact.     Extraocular Movements: Extraocular movements intact.     Conjunctiva/sclera: Conjunctivae normal.     Pupils: Pupils are equal, round, and reactive to light.     Funduscopic exam:    Right eye: Red reflex present.        Left eye: Red reflex present.    Visual Fields: Right eye visual fields normal and left eye visual fields normal.  Neck:     Thyroid: No thyromegaly.     Vascular: No carotid bruit.  Cardiovascular:     Rate and Rhythm: Normal rate and regular rhythm.     Chest Wall: PMI is not displaced.     Pulses: Normal pulses.          Dorsalis pedis pulses are 2+ on the right side and 2+ on the left side.       Posterior tibial pulses are 2+ on the right side and 2+ on the left side.     Heart sounds: Normal heart sounds. No murmur heard. Pulmonary:     Effort: Pulmonary effort is normal. No respiratory distress.      Breath sounds: Normal breath sounds.  Abdominal:     General: Abdomen is flat. Bowel sounds are normal. There is no distension.     Palpations: Abdomen is soft. There is no hepatomegaly, splenomegaly or mass.     Tenderness: There is no abdominal tenderness. There is no right CVA tenderness, left CVA tenderness, guarding or rebound.  Musculoskeletal:        General: Normal range of motion.     Cervical back: Full passive range of motion without pain, normal range of motion and neck supple. No tenderness.     Right lower leg: No edema.     Left lower leg: No edema.  Feet:     Left foot:     Toenail Condition: Left toenails are normal.  Lymphadenopathy:     Cervical: No cervical adenopathy.     Upper Body:     Right upper body: No supraclavicular adenopathy.     Left upper body: No supraclavicular adenopathy.  Skin:    General: Skin is warm and dry.     Capillary Refill: Capillary refill takes less than 2 seconds.     Nails: There is no clubbing.  Neurological:     General: No focal deficit present.     Mental Status: She is alert and oriented to person, place, and time.     GCS: GCS eye subscore is 4. GCS verbal subscore is 5. GCS motor subscore is 6.     Sensory: Sensation is intact.     Motor: Motor function is intact.     Coordination: Coordination  is intact.     Gait: Gait is intact.     Deep Tendon Reflexes: Reflexes are normal and symmetric.  Psychiatric:        Attention and Perception: Attention normal.        Mood and Affect: Mood normal.        Speech: Speech normal.        Behavior: Behavior normal. Behavior is cooperative.        Thought Content: Thought content normal.        Cognition and Memory: Cognition and memory normal.        Judgment: Judgment normal.    Depression Screen PHQ 2/9 Scores 03/01/2021 07/10/2020 10/14/2014  PHQ - 2 Score 2 2 0  PHQ- 9 Score 6 4 -   No results found for any visits on 03/01/21.  Assessment & Plan      Problem List Items  Addressed This Visit     Sebaceous cyst    Firm nodularity noted to the medial aspect of the left breast with dark punctum noted in central location.  Symptoms consistent with sebaceous cyst.  No signs of infection present.  Recommend removal in office in near future.       GAD (generalized anxiety disorder) - Primary    Symptoms and presentation consistent with GAD with obsessive thoughts present. She does have a lot of stressors caring for two small children alone and working full time.   Recommend sertraline 42m once a day for maintenance medication to help best control symptoms. Will also send buspar for PRN use up to three times a day to help with panic symptoms that may present.  No alarm symptoms present today. Will monitor how she is doing when she comes back for cyst removal.       Relevant Medications   sertraline (ZOLOFT) 50 MG tablet   busPIRone (BUSPAR) 5 MG tablet   Other Relevant Orders   CBC with Differential/Platelet   Comprehensive metabolic panel   Lipid panel   Hemoglobin A1c   Mixed obsessional thoughts and acts   Relevant Medications   sertraline (ZOLOFT) 50 MG tablet   busPIRone (BUSPAR) 5 MG tablet   Other Relevant Orders   CBC with Differential/Platelet   Comprehensive metabolic panel   Lipid panel   Hemoglobin A1c   PCOS (polycystic ovarian syndrome)    Currently on OCP.  Not sexually active at this time.  Appears to be controlled on current regimen.  Seeing GYN next week for new pt visit. Will follow      Relevant Orders   CBC with Differential/Platelet   Comprehensive metabolic panel   Lipid panel   Hemoglobin A1c   Primary hypertension    Well controlled on current regimen No alarm symptoms Will monitor labs today and make changes to care based on findings as necessary.  1 year refills provided      Relevant Medications   NIFEdipine (PROCARDIA-XL/NIFEDICAL-XL) 30 MG 24 hr tablet   Other Relevant Orders   CBC with  Differential/Platelet   Comprehensive metabolic panel   Lipid panel   Hemoglobin A1c   Mild intermittent asthma without complication   Relevant Medications   albuterol (VENTOLIN HFA) 108 (90 Base) MCG/ACT inhaler   Other Relevant Orders   CBC with Differential/Platelet   Other Visit Diagnoses     Encounter for medical examination to establish care            Return for CPE today-At your convenience - Pilonidal  cyst removal (58mn) and f/u mood.      Axzel Rockhill, SCoralee Pesa NP, DNP, AGNP-C [facility]

## 2021-03-01 NOTE — Patient Instructions (Addendum)
I have sent in sertraline for the anxiety. This is something you will take on a daily basis.  I have sent in buspar that can be used as needed for those moments during the day if you have panic symptoms you cannot control.   I have sent in an asthma inhaler for you to have on hand.   I sent a refill on your BP medications.   Managing Anxiety, Adult After being diagnosed with anxiety, you may be relieved to know why you have felt or behaved a certain way. You may also feel overwhelmed about the treatment ahead and what it will mean for your life. With care and support, you can manage this condition. How to manage lifestyle changes Managing stress and anxiety Stress is your body's reaction to life changes and events, both good and bad. Most stress will last just a few hours, but stress can be ongoing and can lead to more than just stress. Although stress can play a major role in anxiety, it is not the same as anxiety. Stress is usually caused by something external, such as a deadline, test, or competition. Stress normally passes after the triggering event has ended.  Anxiety is caused by something internal, such as imagining a terrible outcome or worrying that something will go wrong that will devastate you. Anxiety often does not go away even after the triggering event is over, and it can become long-term (chronic) worry. It is important to understand the differences between stress and anxiety and to manage your stress effectively so that it does not lead to an anxious response. Talk with your health care provider or a counselor to learn more about reducing anxiety and stress. He or she may suggest tension reduction techniques, such as: Music therapy. Spend time creating or listening to music that you enjoy and that inspires you. Mindfulness-based meditation. Practice being aware of your normal breaths while not trying to control your breathing. It can be done while sitting or walking. Centering  prayer. This involves focusing on a word, phrase, or sacred image that means something to you and brings you peace. Deep breathing. To do this, expand your stomach and inhale slowly through your nose. Hold your breath for 3-5 seconds. Then exhale slowly, letting your stomach muscles relax. Self-talk. Learn to notice and identify thought patterns that lead to anxiety reactions and change those patterns to thoughts that feel peaceful. Muscle relaxation. Taking time to tense muscles and then relax them. Choose a tension reduction technique that fits your lifestyle and personality. These techniques take time and practice. Set aside 5-15 minutes a day to do them. Therapists can offer counseling and training in these techniques. The training to help with anxiety may be covered by some insurance plans. Other things you can do to manage stress and anxiety include: Keeping a stress diary. This can help you learn what triggers your reaction and then learn ways to manage your response. Thinking about how you react to certain situations. You may not be able to control everything, but you can control your response. Making time for activities that help you relax and not feeling guilty about spending your time in this way. Doing visual imagery. This involves imagining or creating mental pictures to help you relax. Practicing yoga. Through yoga poses, you can lower tension and promote relaxation.  Medicines Medicines can help ease symptoms. Medicines for anxiety include: Antidepressant medicines. These are usually prescribed for long-term daily control. Anti-anxiety medicines. These may be added in severe  cases, especially when panic attacks occur. Medicines will be prescribed by a health care provider. When used together, medicines, psychotherapy, and tension reduction techniques may be the most effective treatment. Relationships Relationships can play a big part in helping you recover. Try to spend more time  connecting with trusted friends and family members. Consider going to couples counseling if you have a partner, taking family education classes, or going to family therapy. Therapy can help you and others better understand your condition. How to recognize changes in your anxiety Everyone responds differently to treatment for anxiety. Recovery from anxiety happens when symptoms decrease and stop interfering with your daily activities at home or work. This may mean that you will start to: Have better concentration and focus. Worry will interfere less in your daily thinking. Sleep better. Be less irritable. Have more energy. Have improved memory. It is also important to recognize when your condition is getting worse. Contact your health care provider if your symptoms interfere with home or work and you feel like your condition is not improving. Follow these instructions at home: Activity Exercise. Adults should do the following: Exercise for at least 150 minutes each week. The exercise should increase your heart rate and make you sweat (moderate-intensity exercise). Strengthening exercises at least twice a week. Get the right amount and quality of sleep. Most adults need 7-9 hours of sleep each night. Lifestyle  Eat a healthy diet that includes plenty of vegetables, fruits, whole grains, low-fat dairy products, and lean protein. Do not eat a lot of foods that are high in fats, added sugars, or salt (sodium). Make choices that simplify your life. Do not use any products that contain nicotine or tobacco. These products include cigarettes, chewing tobacco, and vaping devices, such as e-cigarettes. If you need help quitting, ask your health care provider. Avoid caffeine, alcohol, and certain over-the-counter cold medicines. These may make you feel worse. Ask your pharmacist which medicines to avoid. General instructions Take over-the-counter and prescription medicines only as told by your health  care provider. Keep all follow-up visits. This is important. Where to find support You can get help and support from these sources: Self-help groups. Online and [employer]. A trusted spiritual leader. Couples counseling. Family education classes. Family therapy. Where to find more information You may find that joining a support group helps you deal with your anxiety. The following sources can help you locate counselors or support groups near you: [place]: www.mentalhealthamerica.net Anxiety and Depression Association of [place] (ADAA): https://www.clark.net/ National Alliance on Mental Illness (NAMI): www.nami.org Contact a health care provider if: You have a hard time staying focused or finishing daily tasks. You spend many hours a day feeling worried about everyday life. You become exhausted by worry. You start to have headaches or frequently feel tense. You develop chronic nausea or diarrhea. Get help right away if: You have a racing heart and shortness of breath. You have thoughts of hurting yourself or others. If you ever feel like you may hurt yourself or others, or have thoughts about taking your own life, get help right away. Go to your nearest emergency department or: Call your local emergency services (911 in the [place] Call a suicide crisis helpline, such as the [place] at (347)772-9397 or 988 in the [place]. This is open 24 hours a day in the [place] Text the Crisis Text Line at 952 871 8957 (in the [place].). Summary Taking steps to learn and use tension reduction techniques can help calm you  and help prevent triggering an anxiety reaction. When used together, medicines, psychotherapy, and tension reduction techniques may be the most effective treatment. Family, friends, and partners can play a big part in supporting you. This information is not intended to replace advice given to you by your health care provider. Make sure you discuss any  questions you have with your health care provider. Document Revised: 08/26/2020 Document Reviewed: 05/24/2020 Elsevier Patient Education  [place].

## 2021-03-01 NOTE — Assessment & Plan Note (Addendum)
Firm nodularity noted to the medial aspect of the left breast with dark punctum noted in central location.  Symptoms consistent with sebaceous cyst.  No signs of infection present.  Recommend removal in office in near future.

## 2021-03-02 ENCOUNTER — Encounter (HOSPITAL_BASED_OUTPATIENT_CLINIC_OR_DEPARTMENT_OTHER): Payer: Self-pay | Admitting: Nurse Practitioner

## 2021-03-08 ENCOUNTER — Encounter (HOSPITAL_BASED_OUTPATIENT_CLINIC_OR_DEPARTMENT_OTHER): Payer: Self-pay | Admitting: Obstetrics & Gynecology

## 2021-03-08 ENCOUNTER — Ambulatory Visit (HOSPITAL_BASED_OUTPATIENT_CLINIC_OR_DEPARTMENT_OTHER): Payer: Medicaid Other | Admitting: Obstetrics & Gynecology

## 2021-03-08 ENCOUNTER — Ambulatory Visit (HOSPITAL_BASED_OUTPATIENT_CLINIC_OR_DEPARTMENT_OTHER): Payer: Medicaid Other | Admitting: Nurse Practitioner

## 2021-03-08 VITALS — BP 141/96 | HR 62 | Ht 66.0 in | Wt 176.6 lb

## 2021-03-08 DIAGNOSIS — N9489 Other specified conditions associated with female genital organs and menstrual cycle: Secondary | ICD-10-CM | POA: Diagnosis not present

## 2021-03-08 DIAGNOSIS — M6289 Other specified disorders of muscle: Secondary | ICD-10-CM | POA: Diagnosis not present

## 2021-03-08 DIAGNOSIS — N920 Excessive and frequent menstruation with regular cycle: Secondary | ICD-10-CM | POA: Diagnosis not present

## 2021-03-08 DIAGNOSIS — N946 Dysmenorrhea, unspecified: Secondary | ICD-10-CM

## 2021-03-08 MED ORDER — TRANEXAMIC ACID 650 MG PO TABS: 1300.0000 mg | ORAL_TABLET | Freq: Three times a day (TID) | ORAL | 2 refills | Status: DC

## 2021-03-08 NOTE — Progress Notes (Signed)
GYNECOLOGY  VISIT  CC:   follow up after starting PT  HPI: 31 y.o. [id number] Single Black or [place] American female here for follow.  She has started pelvic PT and has gone for two visits.  Feels this is helping some.  She is doing exercises at home as well.    After last visit, pt was started on micronor.  Reports in second pack of pills, bleeding was about the same and flow lasted 5 days but she had no cramping.  The third cycle was about 5 days but there was a lot of cramping and hip pain.  She did an e visit with different provider and was given ibuprofen.    GYNECOLOGIC HISTORY: Patient's last menstrual period was 02/20/2021.   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   Pelvic floor dysfunction in female 11/29/2020   Chronic bilateral low back pain without sciatica 01/25/2019   Subclinical hyperthyroidism 07/14/2018   Sebaceous cyst 06/29/2016    Past Medical History:  Diagnosis Date   Asthma    occasionally, exercise-induced; uses inhaler; not used "in a while"   Cannabis hyperemesis syndrome concurrent with and due to cannabis abuse ([place]) 08/02/2018   Gestational hypertension 01/25/2019   Hemorrhoids during puerperium with postpartum complication 91/69/4503   Hypertension    started as gestational   Marijuana use 08/24/2016   Positive tox screen in Jan 2018   Scoliosis     Past Surgical History:  Procedure Laterality Date   NO PAST SURGERIES      MEDS:   Current Outpatient Medications on File Prior to Visit  Medication Sig Dispense Refill   albuterol (VENTOLIN HFA) 108 (90 Base) MCG/ACT inhaler Inhale 1-2 puffs into the lungs every 4 (four) hours as needed for wheezing. 8 g  5   Blood Pressure KIT Z34.90 To monitored regularly at home Large 1 each 0   busPIRone (BUSPAR) 5 MG tablet Take 1 tablet (5 mg total) by mouth 3 (three) times daily as needed. 90 tablet 11   NIFEdipine (PROCARDIA-XL/NIFEDICAL-XL) 30 MG 24 hr tablet Take 1 tablet (30 mg total) by mouth daily. 90 tablet 3   norethindrone (MICRONOR) 0.35 MG tablet Take 1 tablet (0.35 mg total) by mouth daily. 84 tablet 1   sertraline (ZOLOFT) 50 MG tablet Take 1 tablet (50 mg total) by mouth daily. 30 tablet 3   No current facility-administered medications on file prior to visit.    ALLERGIES: Patient has no known allergies.  Family History  Problem Relation Age of Onset   Diabetes Father     SH:  single, non smoker  Review of Systems  Eyes: Negative.   Genitourinary:        Dysmenorrhea   PHYSICAL EXAMINATION:    BP (!) 141/96 (BP Location: Left Arm, Patient Position: Sitting, Cuff Size: Large)    Pulse 62    Ht '5\' 6"'  (1.676 m) Comment: reported   Wt 176 lb 9.6 oz (80.1 kg)    LMP 02/20/2021    BMI 28.50 kg/m     Physical Exam Constitutional:      Appearance: Normal appearance.  Neurological:     General: No focal deficit present.     Mental Status: She is alert.  Psychiatric:        Mood and Affect: Mood normal.   Assessment/Plan: 1. Dysmenorrhea - for now will continue micronor - pt will need AEX after 06/2021.  Will plan follow up at that time.  2. Menorrhagia with regular cycle - will add TXA for hopefully additional improvement in bleeding - tranexamic acid (LYSTEDA) 650 MG TABS tablet; Take 2 tablets (1,300 mg total) by mouth 3 (three) times daily. Start with menstrual cycle and take for up to 5 days  Dispense: 30 tablet; Refill: 2  3. Pelvic floor dysfunction in female - pt is still with PT  4. Female pelvic congestion syndrome

## 2021-03-11 ENCOUNTER — Telehealth (HOSPITAL_BASED_OUTPATIENT_CLINIC_OR_DEPARTMENT_OTHER): Payer: Self-pay | Admitting: Obstetrics & Gynecology

## 2021-03-11 ENCOUNTER — Other Ambulatory Visit: Payer: Self-pay

## 2021-03-11 ENCOUNTER — Encounter (HOSPITAL_BASED_OUTPATIENT_CLINIC_OR_DEPARTMENT_OTHER): Payer: Self-pay | Admitting: Nurse Practitioner

## 2021-03-11 ENCOUNTER — Ambulatory Visit (HOSPITAL_BASED_OUTPATIENT_CLINIC_OR_DEPARTMENT_OTHER): Payer: Medicaid Other | Admitting: Nurse Practitioner

## 2021-03-11 VITALS — BP 132/90 | HR 76 | Ht 66.0 in | Wt 176.0 lb

## 2021-03-11 DIAGNOSIS — F411 Generalized anxiety disorder: Secondary | ICD-10-CM

## 2021-03-11 DIAGNOSIS — R110 Nausea: Secondary | ICD-10-CM | POA: Diagnosis not present

## 2021-03-11 DIAGNOSIS — D239 Other benign neoplasm of skin, unspecified: Secondary | ICD-10-CM | POA: Diagnosis not present

## 2021-03-11 MED ORDER — ONDANSETRON HCL 8 MG PO TABS: 8.0000 mg | ORAL_TABLET | Freq: Three times a day (TID) | ORAL | 2 refills | Status: DC | PRN

## 2021-03-11 NOTE — Assessment & Plan Note (Signed)
Significant symptom improvement with use of sertraline daily.  Patient is experiencing nausea associated with new medication.  Will add PRN zofran for patient to use while medication adjustments are being made to help lessen side effects and improve consistent use.  Given the response, joint decision was made to continue the medication at this time and monitor for new or unrelieved nausea. If this continues we may need to consider alternative medication. Ill plan to follow-up in 4 weeks with virtual visit to ensure response is appropriate and symptoms are relieved.

## 2021-03-11 NOTE — Telephone Encounter (Signed)
Called patient and left a message to call the office back to schedule the appointment .  

## 2021-03-11 NOTE — Progress Notes (Signed)
Established Patient Office Visit  Subjective:  Patient ID: Megan Schneider, female    DOB: Jul 07, 1990  Age: 31 y.o. MRN: [id number]  CC:  Chief Complaint  Patient presents with   Follow-up    Patient presents for follow up on GAD and starting medications. She admits to be doing well with sertraline but states it does make her feel nauseous for a few hours after taking. She has not had to use the buspirone to date.    HPI Megan Schneider presents for follow-up for mood and removal of cyst from chest.   Endorses she feels her mood has improved since starting sertraline. She has not had to take the buspar for panic symptoms. She does not feel 100% as of yet ,but does feel better. She is experiencing some nausea with the medication. She reports taking this at night does help, but it is still present intermittently.   Outpatient Medications Prior to Visit  Medication Sig Dispense Refill   albuterol (VENTOLIN HFA) 108 (90 Base) MCG/ACT inhaler Inhale 1-2 puffs into the lungs every 4 (four) hours as needed for wheezing. 8 g 5   Blood Pressure KIT Z34.90 To monitored regularly at home Large 1 each 0   busPIRone (BUSPAR) 5 MG tablet Take 1 tablet (5 mg total) by mouth 3 (three) times daily as needed. 90 tablet 11   NIFEdipine (PROCARDIA-XL/NIFEDICAL-XL) 30 MG 24 hr tablet Take 1 tablet (30 mg total) by mouth daily. 90 tablet 3   norethindrone (MICRONOR) 0.35 MG tablet Take 1 tablet (0.35 mg total) by mouth daily. 84 tablet 1   sertraline (ZOLOFT) 50 MG tablet Take 1 tablet (50 mg total) by mouth daily. 30 tablet 3   tranexamic acid (LYSTEDA) 650 MG TABS tablet Take 2 tablets (1,300 mg total) by mouth 3 (three) times daily. Start with menstrual cycle and take for up to 5 days 30 tablet 2   No facility-administered medications prior to visit.    No Known Allergies  ROS Review of Systems All review of systems negative except what is listed in the HPI    Objective:    Physical  Exam Vitals and nursing note reviewed.  Constitutional:      Appearance: Normal appearance.  HENT:     Head: Normocephalic.  Eyes:     Extraocular Movements: Extraocular movements intact.     Conjunctiva/sclera: Conjunctivae normal.     Pupils: Pupils are equal, round, and reactive to light.  Cardiovascular:     Rate and Rhythm: Normal rate.  Pulmonary:     Effort: Pulmonary effort is normal.  Musculoskeletal:        General: Normal range of motion.  Skin:    General: Skin is warm and dry.     Capillary Refill: Capillary refill takes less than 2 seconds.       Neurological:     General: No focal deficit present.     Mental Status: She is alert and oriented to person, place, and time.  Psychiatric:        Mood and Affect: Mood normal.        Behavior: Behavior normal.        Thought Content: Thought content normal.        Judgment: Judgment normal.    BP 132/90    Pulse 76    Ht '5\' 6"'  (1.676 m)    Wt 176 lb (79.8 kg)    LMP 02/20/2021    SpO2 98%    BMI  28.41 kg/m  Wt Readings from Last 3 Encounters:  03/11/21 176 lb (79.8 kg)  03/08/21 176 lb 9.6 oz (80.1 kg)  03/01/21 180 lb 12.8 oz (82 kg)      Assessment & Plan:    Skin Procedure  Procedure: Informed consent given. Patient placed in supine position on examination table with area exposed.  Sterile prep of the area with chlorhexidine swab x2 and sterile drape place. Area infiltrated with 0.5cc lidocaine with epinephrine and 0.1cc sodium bicarbonate mixture. Using #11 blade, 0.5cm incision made into dermis exposing the capsule of the lesion. Pore opening massaged to expel thick, white/tan, debris consistent with skin cells and sebum. Pore capsule excised utilizing scissors and fine point forceps. Capsule removed in entirety. Hemostasis achieved without additional intervention. No blood loss experienced. Area cleansed with chlorhexidine swab and and skin adhesive utilized to close wound. Sterile dry bandage placed once glue  dried. Patient provided with verbal and written instructions on after care and follow-up procedures. Patient tolerated procedure well with no concerns.   Diagnosis:   ICD-10-CM   1. 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 GAD (generalized anxiety disorder)  F41.1     3. Nausea  R11.0 ondansetron (ZOFRAN) 8 MG tablet      Problem List Items Addressed This Visit     Dilated pore of Winer - Primary    Lesion excised as described above.       GAD (generalized anxiety disorder)    Significant symptom improvement with use of sertraline daily.  Patient is experiencing nausea associated with new medication.  Will add PRN zofran for patient to use while medication adjustments are being made to help lessen side effects and improve consistent use.  Given the response, joint decision was made to continue the medication at this time and monitor for new or unrelieved nausea. If this continues we may need to consider alternative medication. Ill plan to follow-up in 4 weeks with virtual visit to ensure response is appropriate and symptoms are relieved.        Other Visit Diagnoses     Nausea       Relevant Medications   ondansetron (ZOFRAN) 8 MG tablet       Meds ordered this encounter  Medications   ondansetron (ZOFRAN) 8 MG tablet    Sig: Take 1 tablet (8 mg total) by mouth every 8 (eight) hours as needed for nausea or vomiting.    Dispense:  45 tablet    Refill:  2    Follow-up: Return in about 4 weeks (around 04/08/2021) for Mood check-in- virtual visit.    Orma Render, NP

## 2021-03-11 NOTE — Patient Instructions (Addendum)
Monitor the area of cyst removal for warmth, drainage, pus, or spreading redness and notify if any of these occur immediately.  Keep the area covered for at least 24 hours and then you may let the area stay open to air. Do not rub the area to clean- allow warm water to roll over the area in the shower once you have removed the bandage.   The purple glue will fall off naturally in about a week as the skin heals. Try not to pull it off as this can reopen the area.   You can use an ice pack and ibuprofen or tylenol if the area feels sore. You should not feel a lot of pain, but if you do please let me know.    I sent in Zofran to help with the nausea. If this does not go away, let me know. We can plan to check in in about a month with a phone call just to make sure you feel the medication is working and you aren't having any worsening or new symptoms.

## 2021-03-11 NOTE — Assessment & Plan Note (Signed)
Lesion excised as described above.

## 2021-03-14 ENCOUNTER — Encounter (HOSPITAL_BASED_OUTPATIENT_CLINIC_OR_DEPARTMENT_OTHER): Payer: Self-pay | Admitting: Nurse Practitioner

## 2021-03-17 ENCOUNTER — Other Ambulatory Visit: Payer: Self-pay

## 2021-03-17 ENCOUNTER — Encounter (HOSPITAL_BASED_OUTPATIENT_CLINIC_OR_DEPARTMENT_OTHER): Payer: Self-pay | Admitting: Nurse Practitioner

## 2021-03-17 ENCOUNTER — Ambulatory Visit (HOSPITAL_BASED_OUTPATIENT_CLINIC_OR_DEPARTMENT_OTHER): Payer: Medicaid Other | Admitting: Nurse Practitioner

## 2021-03-17 VITALS — BP 117/72 | HR 72 | Resp 96 | Ht 66.0 in | Wt 178.0 lb

## 2021-03-17 DIAGNOSIS — T7849XA Other allergy, initial encounter: Secondary | ICD-10-CM | POA: Diagnosis not present

## 2021-03-17 DIAGNOSIS — L0889 Other specified local infections of the skin and subcutaneous tissue: Secondary | ICD-10-CM

## 2021-03-17 MED ORDER — SULFAMETHOXAZOLE-TRIMETHOPRIM 800-160 MG PO TABS: 1.0000 | ORAL_TABLET | Freq: Two times a day (BID) | ORAL | 0 refills | Status: DC

## 2021-03-17 MED ORDER — BETAMETHASONE DIPROPIONATE 0.05 % EX OINT: TOPICAL_OINTMENT | Freq: Two times a day (BID) | CUTANEOUS | 3 refills | Status: DC

## 2021-03-17 NOTE — Progress Notes (Signed)
Acute Office Visit  Subjective:    Patient ID: Megan Schneider, female    DOB: Nov 09, 1990, 31 y.o.   MRN: [id number]  Chief Complaint  Patient presents with   Wound Check    Patient comes in today concerned that incision of the left breast was infected x 3 days.    HPI Patient is in today for possible infection to area of pore of Winer removal. Pore of Winer removed on 03/11/2021 from left breast in office. Megan Schneider tells me that she is concerned about possible infection of the site. She has not noticed any redness or drainage. She has had a reaction to the adhesive used for bandaging.  She has remove this bandaging at this time and the wound is open to air with the skin glue still in place.  She tells me that she was concerned about possible infection because of the redness around the wound area and wanted it looked at again.  She has no nausea, vomiting, diarrhea, increased pain, warmth, red streaks, or drainage present   No Known Allergies  Review of Systems All review of systems negative except what is listed in the HPI     Objective:    Physical Exam Cardiovascular:     Rate and Rhythm: Normal rate and regular rhythm.     Pulses: Normal pulses.     Heart sounds: Normal heart sounds.  Pulmonary:     Effort: Pulmonary effort is normal.     Breath sounds: Normal breath sounds.  Musculoskeletal:        General: Normal range of motion.  Skin:    General: Skin is warm and dry.     Capillary Refill: Capillary refill takes less than 2 seconds.     Findings: Rash present.  Neurological:     General: No focal deficit present.     Mental Status: She is oriented to person, place, and time.  Psychiatric:        Mood and Affect: Mood normal.        Behavior: Behavior normal.        Thought Content: Thought content normal.        Judgment: Judgment normal.    BP 117/72    Pulse 72    Resp (!) 96    Ht 5\' 6"  (1.676 m)    Wt 178 lb (80.7 kg)    LMP 02/20/2021    BMI 28.73  kg/m  Wt Readings from Last 3 Encounters:  03/17/21 178 lb (80.7 kg)  03/11/21 176 lb (79.8 kg)  03/08/21 176 lb 9.6 oz (80.1 kg)     Assessment & Plan:   Problem List Items Addressed This Visit     Secondary infection of skin - Primary    No erythema, edema, streaking, drainage present.  Skin glue intact. Excoriation is present in the area surrounding the surgical site consistent with adhesive bandage allergic reaction. At this time there does not appear to be any concerning findings with the wound however discussed with patient that I can send in an antibiotic for her to have on hand if she should notice any changes over the next few days as I will be out of town and unable to be reached.  She is agreeable to this plan. We will send in corticosteroid for use on the allergic reaction. Recommend keeping the area open to air and avoiding picking or pulling at the adhesive over the skin wound.  If she notices any changes or new  or concerning symptoms she will notify the office immediately.      Relevant Medications   sulfamethoxazole-trimethoprim (BACTRIM DS) 800-160 MG tablet   betamethasone dipropionate (DIPROLENE) 0.05 % ointment     Meds ordered this encounter  Medications   sulfamethoxazole-trimethoprim (BACTRIM DS) 800-160 MG tablet    Sig: Take 1 tablet by mouth 2 (two) times daily.    Dispense:  10 tablet    Refill:  0   betamethasone dipropionate (DIPROLENE) 0.05 % ointment    Sig: Apply topically 2 (two) times daily. For 7 days then as needed for rash.    Dispense:  30 g    Refill:  3     Orma Render, NP

## 2021-03-17 NOTE — Patient Instructions (Signed)
We are going to start oral and topical antibiotics for the infection to be sure that we can get this cleared up fast.  I have sent this to the pharmacy for you. I have also sent diflucan to the pharmacy in the even you get a yeast infection from the antibiotic.   Please continue to monitor the site for worsening redness, drainage, pain, and warmth.  If this appears to be worsening, please contact the office or seek emergency care for evaluation.  If you develop fevers, chills, body aches, or other signs of infection, please seek care immediately.   We will plan to follow-up in 1 week to see how this is doing.

## 2021-03-18 ENCOUNTER — Ambulatory Visit: Payer: Medicaid Other | Attending: Obstetrics & Gynecology | Admitting: Physical Therapy

## 2021-03-18 DIAGNOSIS — R252 Cramp and spasm: Secondary | ICD-10-CM | POA: Insufficient documentation

## 2021-03-18 DIAGNOSIS — R279 Unspecified lack of coordination: Secondary | ICD-10-CM | POA: Insufficient documentation

## 2021-03-18 DIAGNOSIS — M6281 Muscle weakness (generalized): Secondary | ICD-10-CM | POA: Insufficient documentation

## 2021-03-19 ENCOUNTER — Encounter (HOSPITAL_BASED_OUTPATIENT_CLINIC_OR_DEPARTMENT_OTHER): Payer: Self-pay | Admitting: Nurse Practitioner

## 2021-03-19 NOTE — Assessment & Plan Note (Signed)
No erythema, edema, streaking, drainage present.  Skin glue intact. Excoriation is present in the area surrounding the surgical site consistent with adhesive bandage allergic reaction. At this time there does not appear to be any concerning findings with the wound however discussed with patient that I can send in an antibiotic for her to have on hand if she should notice any changes over the next few days as I will be out of town and unable to be reached.  She is agreeable to this plan. We will send in corticosteroid for use on the allergic reaction. Recommend keeping the area open to air and avoiding picking or pulling at the adhesive over the skin wound.  If she notices any changes or new or concerning symptoms she will notify the office immediately.

## 2021-03-22 ENCOUNTER — Ambulatory Visit: Payer: Medicaid Other | Admitting: Physical Therapy

## 2021-03-29 ENCOUNTER — Ambulatory Visit: Payer: Medicaid Other | Attending: Internal Medicine

## 2021-03-29 ENCOUNTER — Other Ambulatory Visit (HOSPITAL_BASED_OUTPATIENT_CLINIC_OR_DEPARTMENT_OTHER): Payer: Self-pay

## 2021-03-29 DIAGNOSIS — Z23 Encounter for immunization: Secondary | ICD-10-CM

## 2021-03-29 MED ORDER — COVID-19 AD26 VACCINE(JANSSEN) 0.5 ML IM SUSP
INTRAMUSCULAR | 0 refills | Status: DC
  Filled 2021-03-29: qty 0.5, 1d supply, fill #0

## 2021-03-29 NOTE — Progress Notes (Signed)
° °  Covid-19 Vaccination Clinic  Name:  Megan Schneider    MRN: [id number] DOB: Dec 21, 1990  03/29/2021  [name] was observed post Covid-19 immunization for 15 minutes without incident. She was provided with Vaccine Information Sheet and instruction to access the V-Safe system.   [name] was instructed to call 911 with any severe reactions post vaccine: Difficulty breathing  Swelling of face and throat  A fast heartbeat  A bad rash all over body  Dizziness and weakness   Immunizations Administered     Name Date Dose VIS Date Route   JANSSEN COVID-19 VACCINE 03/29/2021  2:41 PM 0.5 mL 12/04/2019 Intramuscular   Manufacturer: [employer]   Lot: [id number]   [place]: 29290-903-01

## 2021-04-01 ENCOUNTER — Telehealth: Payer: Self-pay | Admitting: Physical Therapy

## 2021-04-01 ENCOUNTER — Ambulatory Visit (HOSPITAL_BASED_OUTPATIENT_CLINIC_OR_DEPARTMENT_OTHER): Payer: Medicaid Other | Admitting: Nurse Practitioner

## 2021-04-01 ENCOUNTER — Ambulatory Visit: Payer: Medicaid Other | Admitting: Physical Therapy

## 2021-04-01 NOTE — Telephone Encounter (Signed)
PT called pt about missing 3 previous appointments (02/22/21, 03/18/21, 03/22/21) and needing to discuss further need for PT. Pt did not answer, voicemail left to call clinic otherwise will unfortunately need to be discharged from PT and will need a new referral.   Stacy Gardner, PT, DPT 02/16/238:23 AM

## 2021-04-05 ENCOUNTER — Encounter: Payer: Medicaid Other | Admitting: Physical Therapy

## 2021-04-07 ENCOUNTER — Encounter: Payer: Self-pay | Admitting: Physical Therapy

## 2021-04-15 ENCOUNTER — Encounter: Payer: Medicaid Other | Admitting: Physical Therapy

## 2021-04-19 ENCOUNTER — Encounter: Payer: Medicaid Other | Admitting: Physical Therapy

## 2021-07-10 ENCOUNTER — Other Ambulatory Visit: Payer: Self-pay

## 2021-07-10 ENCOUNTER — Encounter (HOSPITAL_COMMUNITY): Payer: Self-pay | Admitting: Emergency Medicine

## 2021-07-10 ENCOUNTER — Emergency Department (HOSPITAL_COMMUNITY)
Admission: EM | Admit: 2021-07-10 | Discharge: 2021-07-11 | Disposition: A | Discharge status: Home / Self Care | Payer: Medicaid Other | Attending: Emergency Medicine | Admitting: Emergency Medicine

## 2021-07-10 DIAGNOSIS — S81012A Laceration without foreign body, left knee, initial encounter: Secondary | ICD-10-CM | POA: Insufficient documentation

## 2021-07-10 DIAGNOSIS — Z23 Encounter for immunization: Secondary | ICD-10-CM | POA: Diagnosis not present

## 2021-07-10 DIAGNOSIS — S8992XA Unspecified injury of left lower leg, initial encounter: Secondary | ICD-10-CM | POA: Diagnosis present

## 2021-07-10 DIAGNOSIS — W268XXA Contact with other sharp object(s), not elsewhere classified, initial encounter: Secondary | ICD-10-CM | POA: Diagnosis not present

## 2021-07-10 MED ORDER — LIDOCAINE-EPINEPHRINE-TETRACAINE (LET) TOPICAL GEL
3.0000 mL | Freq: Once | TOPICAL | Status: AC
  Administered 2021-07-10: 3 mL via TOPICAL
  Filled 2021-07-10: qty 3

## 2021-07-10 MED ORDER — BACITRACIN ZINC 500 UNIT/GM EX OINT
TOPICAL_OINTMENT | Freq: Two times a day (BID) | CUTANEOUS | Status: DC
  Filled 2021-07-10: qty 0.9

## 2021-07-10 MED ORDER — TETANUS-DIPHTH-ACELL PERTUSSIS 5-2.5-18.5 LF-MCG/0.5 IM SUSY
0.5000 mL | PREFILLED_SYRINGE | Freq: Once | INTRAMUSCULAR | Status: AC
  Administered 2021-07-10: 0.5 mL via INTRAMUSCULAR
  Filled 2021-07-10: qty 0.5

## 2021-07-10 NOTE — ED Provider Notes (Signed)
[facility] EMERGENCY DEPARTMENT Provider Note   CSN: [id number] Arrival date & time: 07/10/21  2204     History  Chief Complaint  Patient presents with   Laceration    Megan Schneider is a 31 y.o. female.   Laceration  Patient is a healthy 31 year old female presented emergency room today with laceration to left knee.  This happened approximately an hour and half prior to arrival.  She states that occurred when a piece of metal cut her knee.  She states that it was accidental.  She denies any other injuries.  She states she is able to stop the bleeding with pressure.  She came to the ER for evaluation.        Home Medications Prior to Admission medications   Medication Sig Start Date End Date Taking? Authorizing Provider  albuterol (VENTOLIN HFA) 108 (90 Base) MCG/ACT inhaler Inhale 1-2 puffs into the lungs every 4 (four) hours as needed for wheezing. 03/01/21   Orma Render, NP  betamethasone dipropionate (DIPROLENE) 0.05 % ointment Apply topically 2 (two) times daily. For 7 days then as needed for rash. 03/17/21   Orma Render, NP  Blood Pressure KIT Z34.90 To monitored regularly at home Large 07/05/18   Sloan Leiter, MD  busPIRone (BUSPAR) 5 MG tablet Take 1 tablet (5 mg total) by mouth 3 (three) times daily as needed. 03/01/21   Orma Render, NP  COVID-19 Ad26 vaccine, JANSSEN/J&J, 0.5 ML injection Inject into the muscle. 03/29/21   Carlyle Basques, MD  NIFEdipine (PROCARDIA-XL/NIFEDICAL-XL) 30 MG 24 hr tablet Take 1 tablet (30 mg total) by mouth daily. 03/01/21   Orma Render, NP  norethindrone (MICRONOR) 0.35 MG tablet Take 1 tablet (0.35 mg total) by mouth daily. 11/26/20   Megan Salon, MD  ondansetron (ZOFRAN) 8 MG tablet Take 1 tablet (8 mg total) by mouth every 8 (eight) hours as needed for nausea or vomiting. 03/11/21   Early, Coralee Pesa, NP  sertraline (ZOLOFT) 50 MG tablet Take 1 tablet (50 mg total) by mouth daily. 03/01/21   Orma Render, NP   sulfamethoxazole-trimethoprim (BACTRIM DS) 800-160 MG tablet Take 1 tablet by mouth 2 (two) times daily. 03/17/21   Orma Render, NP  tranexamic acid (LYSTEDA) 650 MG TABS tablet Take 2 tablets (1,300 mg total) by mouth 3 (three) times daily. Start with menstrual cycle and take for up to 5 days 03/08/21   Megan Salon, MD      Allergies    Patient has no known allergies.    Review of Systems   Review of Systems  Physical Exam Updated Vital Signs BP (!) 141/101 (BP Location: Right Arm)   Pulse 63   Temp 98 F (36.7 C) (Oral)   Resp 16   Ht 5' 6" (1.676 m)   Wt 80.7 kg   SpO2 98%   BMI 28.73 kg/m  Physical Exam Vitals and nursing note reviewed.  Constitutional:      General: She is not in acute distress.    Appearance: Normal appearance. She is not ill-appearing.  HENT:     Head: Normocephalic and atraumatic.  Eyes:     General: No scleral icterus.       Right eye: No discharge.        Left eye: No discharge.     Conjunctiva/sclera: Conjunctivae normal.  Pulmonary:     Effort: Pulmonary effort is normal.     Breath sounds: No stridor.  Skin:    General: Skin is warm and dry.     Comments: Approximately 2 cm laceration over the anterior left knee that is gaping.  No active bleeding.  Sensation intact distally.  Full range of motion of the knee.  No other lacerations. Entire wound explored.  No foreign bodies  Neurological:     Mental Status: She is alert and oriented to person, place, and time. Mental status is at baseline.    ED Results / Procedures / Treatments   Labs (all labs ordered are listed, but only abnormal results are displayed) Labs Reviewed - No data to display  EKG None  Radiology No results found.  Procedures ..Laceration Repair  Date/Time: 07/11/2021 12:00 AM Performed by: Fondaw, Wylder S, PA Authorized by: Fondaw, Wylder S, PA   Consent:    Consent obtained:  Verbal   Consent given by:  Patient   Risks discussed:  Infection, need for  additional repair, pain, poor cosmetic result and poor wound healing   Alternatives discussed:  No treatment and delayed treatment Universal protocol:    Procedure explained and questions answered to patient or proxy's satisfaction: yes     Relevant documents present and verified: yes     Test results available: yes     Imaging studies available: yes     Required blood products, implants, devices, and special equipment available: yes     Site/side marked: yes     Immediately prior to procedure, a time out was called: yes     Patient identity confirmed:  Verbally with patient Anesthesia:    Anesthesia method:  Topical application   Topical anesthetic:  LET Laceration details:    Location:  Leg   Leg location:  L knee   Length (cm):  2 Exploration:    Hemostasis achieved with:  Direct pressure   Wound extent: no foreign bodies/material noted and no tendon damage noted     Contaminated: no   Treatment:    Area cleansed with:  Saline   Amount of cleaning:  Standard   Irrigation solution:  Sterile saline   Irrigation method:  Pressure wash   Visualized foreign bodies/material removed: no   Skin repair:    Repair method:  Staples   Number of staples:  2 Approximation:    Approximation:  Close Repair type:    Repair type:  Simple Post-procedure details:    Dressing:  Antibiotic ointment and non-adherent dressing   Procedure completion:  Tolerated well, no immediate complications    Medications Ordered in ED Medications  bacitracin ointment (has no administration in time range)  Tdap (BOOSTRIX) injection 0.5 mL (0.5 mLs Intramuscular Given 07/10/21 2301)  lidocaine-EPINEPHrine-tetracaine (LET) topical gel (3 mLs Topical Given 07/10/21 2307)    ED Course/ Medical Decision Making/ A&P                           Medical Decision Making Risk OTC drugs. Prescription drug management.   Patient is a healthy 30-year-old female presented emergency room today with laceration to  left knee.  This happened approximately an hour and half prior to arrival.  She states that occurred when a piece of metal cut her knee.  She states that it was accidental.  She denies any other injuries.  She states she is able to stop the bleeding with pressure.  She came to the ER for evaluation.   Pressure irrigation performed. Wound explored and base of wound   visualized in a bloodless field without evidence of foreign body.  Laceration occurred < 8 hours prior to repair which was well tolerated. Tdap updated.  Pt has no comorbidities to effect normal wound healing. Pt discharged without antibiotics.  Discussed suture home care with patient and answered questions. Pt to follow-up for wound check and suture removal in 7 days; they are to return to the ED sooner for signs of infection. Pt is hemodynamically stable with no complaints prior to dc.     Wound care instructions provided.  Patient will follow-up in 10 to 14 days for removal of staples.  Final Clinical Impression(s) / ED Diagnoses Final diagnoses:  Laceration of left knee, initial encounter    Rx / DC Orders ED Discharge Orders     None         Fondaw, Wylder S, PA 07/11/21 0033    Butler, Michael C, MD 07/11/21 1036  

## 2021-07-10 NOTE — ED Provider Notes (Incomplete)
[facility] EMERGENCY DEPARTMENT Provider Note   CSN: [id number] Arrival date & time: 07/10/21  2204     History {Add pertinent medical, surgical, social history, OB history to HPI:1} Chief Complaint  Patient presents with  . Laceration    Rateel Beldin is a 31 y.o. female.   Laceration  Patient is a healthy 31 year old female presented emergency room today with laceration to left knee.  This happened approximately an hour and half prior to arrival.  She states that occurred when a piece of metal cut her knee.  She states that it was accidental.  She denies any other injuries.  She states she is able to stop the bleeding with pressure.  She came to the ER for evaluation.        Home Medications Prior to Admission medications   Medication Sig Start Date End Date Taking? Authorizing Provider  albuterol (VENTOLIN HFA) 108 (90 Base) MCG/ACT inhaler Inhale 1-2 puffs into the lungs every 4 (four) hours as needed for wheezing. 03/01/21   Orma Render, NP  betamethasone dipropionate (DIPROLENE) 0.05 % ointment Apply topically 2 (two) times daily. For 7 days then as needed for rash. 03/17/21   Orma Render, NP  Blood Pressure KIT Z34.90 To monitored regularly at home Large 07/05/18   Sloan Leiter, MD  busPIRone (BUSPAR) 5 MG tablet Take 1 tablet (5 mg total) by mouth 3 (three) times daily as needed. 03/01/21   Orma Render, NP  COVID-19 Ad26 vaccine, JANSSEN/J&J, 0.5 ML injection Inject into the muscle. 03/29/21   Carlyle Basques, MD  NIFEdipine (PROCARDIA-XL/NIFEDICAL-XL) 30 MG 24 hr tablet Take 1 tablet (30 mg total) by mouth daily. 03/01/21   Orma Render, NP  norethindrone (MICRONOR) 0.35 MG tablet Take 1 tablet (0.35 mg total) by mouth daily. 11/26/20   Megan Salon, MD  ondansetron (ZOFRAN) 8 MG tablet Take 1 tablet (8 mg total) by mouth every 8 (eight) hours as needed for nausea or vomiting. 03/11/21   Early, Coralee Pesa, NP  sertraline (ZOLOFT) 50 MG tablet Take  1 tablet (50 mg total) by mouth daily. 03/01/21   Orma Render, NP  sulfamethoxazole-trimethoprim (BACTRIM DS) 800-160 MG tablet Take 1 tablet by mouth 2 (two) times daily. 03/17/21   Orma Render, NP  tranexamic acid (LYSTEDA) 650 MG TABS tablet Take 2 tablets (1,300 mg total) by mouth 3 (three) times daily. Start with menstrual cycle and take for up to 5 days 03/08/21   Megan Salon, MD      Allergies    Patient has no known allergies.    Review of Systems   Review of Systems  Physical Exam Updated Vital Signs BP (!) 141/101 (BP Location: Right Arm)   Pulse 63   Temp 98 F (36.7 C) (Oral)   Resp 16   Ht '5\' 6"'  (1.676 m)   Wt 80.7 kg   SpO2 98%   BMI 28.73 kg/m  Physical Exam Vitals and nursing note reviewed.  Constitutional:      General: She is not in acute distress.    Appearance: Normal appearance. She is not ill-appearing.  HENT:     Head: Normocephalic and atraumatic.  Eyes:     General: No scleral icterus.       Right eye: No discharge.        Left eye: No discharge.     Conjunctiva/sclera: Conjunctivae normal.  Pulmonary:     Effort: Pulmonary effort is  normal.     Breath sounds: No stridor.  Skin:    General: Skin is warm and dry.     Comments: Approximately 2 cm laceration over the anterior left knee that is gaping.  No active bleeding.  Sensation intact distally.  Full range of motion of the knee.  No other lacerations. Entire wound explored.  No foreign bodies  Neurological:     Mental Status: She is alert and oriented to person, place, and time. Mental status is at baseline.    ED Results / Procedures / Treatments   Labs (all labs ordered are listed, but only abnormal results are displayed) Labs Reviewed - No data to display  EKG None  Radiology No results found.  Procedures .Marland KitchenLaceration Repair  Date/Time: 07/11/2021 12:00 AM Performed by: Tedd Sias, PA Authorized by: Tedd Sias, PA   Consent:    Consent obtained:  Verbal    Consent given by:  Patient   Risks discussed:  Infection, need for additional repair, pain, poor cosmetic result and poor wound healing   Alternatives discussed:  No treatment and delayed treatment Universal protocol:    Procedure explained and questions answered to patient or proxy's satisfaction: yes     Relevant documents present and verified: yes     Test results available: yes     Imaging studies available: yes     Required blood products, implants, devices, and special equipment available: yes     Site/side marked: yes     Immediately prior to procedure, a time out was called: yes     Patient identity confirmed:  Verbally with patient Anesthesia:    Anesthesia method:  Topical application   Topical anesthetic:  LET Laceration details:    Location:  Leg   Leg location:  L knee   Length (cm):  2 Exploration:    Hemostasis achieved with:  Direct pressure   Wound extent: no foreign bodies/material noted and no tendon damage noted     Contaminated: no   Treatment:    Area cleansed with:  Saline   Amount of cleaning:  Standard   Irrigation solution:  Sterile saline   Irrigation method:  Pressure wash   Visualized foreign bodies/material removed: no   Skin repair:    Repair method:  Sutures Approximation:    Approximation:  Close Post-procedure details:    Dressing:  Antibiotic ointment and non-adherent dressing   Procedure completion:  Tolerated well, no immediate complications  {Document cardiac monitor, telemetry assessment procedure when appropriate:1}  Medications Ordered in ED Medications  bacitracin ointment (has no administration in time range)  Tdap (BOOSTRIX) injection 0.5 mL (0.5 mLs Intramuscular Given 07/10/21 2301)  lidocaine-EPINEPHrine-tetracaine (LET) topical gel (3 mLs Topical Given 07/10/21 2307)    ED Course/ Medical Decision Making/ A&P                           Medical Decision Making Risk OTC drugs. Prescription drug management.   Patient is a  healthy 31 year old female presented emergency room today with laceration to left knee.  This happened approximately an hour and half prior to arrival.  She states that occurred when a piece of metal cut her knee.  She states that it was accidental.  She denies any other injuries.  She states she is able to stop the bleeding with pressure.  She came to the ER for evaluation.   Pressure irrigation performed. Wound explored and base of  wound visualized in a bloodless field without evidence of foreign body.  Laceration occurred < 8 hours prior to repair which was well tolerated. Tdap updated.  Pt has no comorbidities to effect normal wound healing. Pt discharged without antibiotics.  Discussed suture home care with patient and answered questions. Pt to follow-up for wound check and suture removal in 7 days; they are to return to the ED sooner for signs of infection. Pt is hemodynamically stable with no complaints prior to dc.     Wound care instructions provided.  Patient will follow-up in 10 to 14 days for removal of staples.  Final Clinical Impression(s) / ED Diagnoses Final diagnoses:  Laceration of left knee, initial encounter    Rx / DC Orders ED Discharge Orders     None

## 2021-07-10 NOTE — ED Notes (Signed)
Report given to Maudie Mercury RN while this RN is on break

## 2021-07-10 NOTE — ED Triage Notes (Signed)
Laceration to left knee with a "piece of metal"; bleeding controlled at this time

## 2021-07-10 NOTE — Discharge Instructions (Addendum)
Stapled repair Keep the laceration site dry for the next 24 hours and leave the dressing in place. After 24 hours you may remove the dressing and gently clean the laceration site with antibacterial soap and warm water. Do not scrub the area. Do not soak the area and water for long periods of time. Don't use hydrogen peroxide, iodine-based solutions, or alcohol, which can slow healing, and will probably be painful! Apply topical bacitracin 1-2 times per day for the next 3-5 days. Return to the emergency department in 10-14 days for removal of the staples.  You should return sooner for any signs of infection which would include increased redness around the wound, increased swelling, new drainage of yellow pus.

## 2021-07-10 NOTE — ED Notes (Addendum)
Pt's laceration cleaned with dermal cleanse spray.

## 2021-07-13 ENCOUNTER — Telehealth: Payer: Self-pay

## 2021-07-13 NOTE — Telephone Encounter (Signed)
Transition Care Management Follow-up Telephone Call Date of discharge and from where: 07/10/2021 from [facility] How have you been since you were released from the hospital? Patient stated that she is feeling well. Patient stated that she did not have any questions or concerns at this time.  Any questions or concerns? No  Items Reviewed: Did the pt receive and understand the discharge instructions provided? Yes  Medications obtained and verified? Yes  Other? No  Any new allergies since your discharge? No  Dietary orders reviewed? No Do you have support at home? Yes   Functional Questionnaire: (I = Independent and D = Dependent) ADLs: I  Bathing/Dressing- I  Meal Prep- I  Eating- I  Maintaining continence- I  Transferring/Ambulation- I  Managing Meds- I   Follow up appointments reviewed:  PCP Hospital f/u appt confirmed? No  Patient will call PCP to have staples removed.  [facility] f/u appt confirmed? No  Are transportation arrangements needed? No  If their condition worsens, is the pt aware to call PCP or go to the Emergency Dept.? Yes Was the patient provided with contact information for the PCP's office or ED? Yes Was to pt encouraged to call back with questions or concerns? Yes

## 2021-08-14 ENCOUNTER — Encounter (HOSPITAL_BASED_OUTPATIENT_CLINIC_OR_DEPARTMENT_OTHER): Payer: Self-pay | Admitting: Nurse Practitioner

## 2021-12-26 ENCOUNTER — Encounter (HOSPITAL_BASED_OUTPATIENT_CLINIC_OR_DEPARTMENT_OTHER): Payer: Self-pay | Admitting: Obstetrics & Gynecology

## 2022-01-04 ENCOUNTER — Ambulatory Visit (INDEPENDENT_AMBULATORY_CARE_PROVIDER_SITE_OTHER): Payer: Medicaid Other | Admitting: *Deleted

## 2022-01-04 DIAGNOSIS — Z3202 Encounter for pregnancy test, result negative: Secondary | ICD-10-CM

## 2022-01-04 DIAGNOSIS — Z30013 Encounter for initial prescription of injectable contraceptive: Secondary | ICD-10-CM

## 2022-01-04 LAB — POCT URINE PREGNANCY: Preg Test, Ur: POSITIVE — AB

## 2022-01-04 MED ORDER — MEDROXYPROGESTERONE ACETATE 150 MG/ML IM SUSP
150.0000 mg | Freq: Once | INTRAMUSCULAR | Status: AC
  Administered 2022-01-04: 150 mg via INTRAMUSCULAR

## 2022-01-04 NOTE — Progress Notes (Signed)
Pt here for depo provera contraception. Pt with recent pregnancy termination 3 weeks ago, so pregnancy test with faint positive lines. Pt had bleeding last week and has not had intercourse since termination, so pregnancy reasonably ruled out. Depo injection given, pt to return in 2 weeks for repeat pregnancy test

## 2022-01-18 ENCOUNTER — Ambulatory Visit (HOSPITAL_BASED_OUTPATIENT_CLINIC_OR_DEPARTMENT_OTHER): Payer: Medicaid Other

## 2022-01-19 ENCOUNTER — Ambulatory Visit (INDEPENDENT_AMBULATORY_CARE_PROVIDER_SITE_OTHER): Payer: Medicaid Other | Admitting: *Deleted

## 2022-01-19 DIAGNOSIS — Z3202 Encounter for pregnancy test, result negative: Secondary | ICD-10-CM

## 2022-01-19 LAB — POCT URINE PREGNANCY: Preg Test, Ur: NEGATIVE

## 2022-01-19 NOTE — Progress Notes (Signed)
Pt here for repeat pregnancy test. Pt with recent IAB and in for depo on 01/04/22. Pregnancy test still showing faint positive at that visit. Pregnancy test today negative. Pt will return for next depo injection when due.

## 2022-02-22 ENCOUNTER — Encounter: Payer: Self-pay | Admitting: Internal Medicine

## 2022-03-10 ENCOUNTER — Encounter (HOSPITAL_BASED_OUTPATIENT_CLINIC_OR_DEPARTMENT_OTHER): Payer: Self-pay | Admitting: Obstetrics & Gynecology

## 2022-03-28 ENCOUNTER — Ambulatory Visit (INDEPENDENT_AMBULATORY_CARE_PROVIDER_SITE_OTHER): Payer: Medicaid Other

## 2022-03-28 DIAGNOSIS — Z3042 Encounter for surveillance of injectable contraceptive: Secondary | ICD-10-CM | POA: Diagnosis not present

## 2022-03-28 MED ORDER — MEDROXYPROGESTERONE ACETATE 150 MG/ML IM SUSP
150.0000 mg | Freq: Once | INTRAMUSCULAR | Status: AC
  Administered 2022-03-28: 150 mg via INTRAMUSCULAR

## 2022-03-28 NOTE — Progress Notes (Signed)
Patient came in today to receive the MedroxyProgesterone Acetate 147m injection. Injection was given in the right glu med. Patient tolerated the injection well. Patient was advised to come back between 06/14/22-06/28/2022. Informed patient that 06/14/2022 is the earliest she can get the injection and 06/28/2022 is the latest to receive the injection. Encouraged patient to come in sooner than later in the event she has to reschedule. tbw

## 2022-05-19 ENCOUNTER — Ambulatory Visit (INDEPENDENT_AMBULATORY_CARE_PROVIDER_SITE_OTHER): Payer: Medicaid Other | Admitting: Obstetrics & Gynecology

## 2022-05-19 ENCOUNTER — Encounter (HOSPITAL_BASED_OUTPATIENT_CLINIC_OR_DEPARTMENT_OTHER): Payer: Self-pay | Admitting: Obstetrics & Gynecology

## 2022-05-19 ENCOUNTER — Other Ambulatory Visit (HOSPITAL_COMMUNITY)
Admission: RE | Admit: 2022-05-19 | Discharge: 2022-05-19 | Disposition: A | Discharge status: Home / Self Care | Payer: Medicaid Other | Source: Ambulatory Visit | Attending: Obstetrics & Gynecology | Admitting: Obstetrics & Gynecology

## 2022-05-19 VITALS — BP 160/88 | HR 69 | Ht 66.0 in | Wt 182.6 lb

## 2022-05-19 DIAGNOSIS — Z124 Encounter for screening for malignant neoplasm of cervix: Secondary | ICD-10-CM | POA: Diagnosis not present

## 2022-05-19 DIAGNOSIS — E282 Polycystic ovarian syndrome: Secondary | ICD-10-CM

## 2022-05-19 DIAGNOSIS — I10 Essential (primary) hypertension: Secondary | ICD-10-CM | POA: Diagnosis not present

## 2022-05-19 DIAGNOSIS — Z01419 Encounter for gynecological examination (general) (routine) without abnormal findings: Secondary | ICD-10-CM | POA: Diagnosis not present

## 2022-05-19 MED ORDER — NIFEDIPINE ER 60 MG PO TB24: 60.0000 mg | ORAL_TABLET | Freq: Every day | ORAL | 1 refills | Status: DC

## 2022-05-19 NOTE — Progress Notes (Signed)
32 y.o. [id number] Single Black or [place] American female here for annual exam.  H/O hypertension.  PCP has moved to another office.  Would like referral to PCP here at [facility].  Needs RF for medication.    No LMP recorded. (Menstrual status: Irregular Periods).          Sexually active: Yes.    The current method of family planning is oral progesterone-only contraceptive.    Smoker:  no  Health Maintenance: Pap:  needs repeat today History of abnormal Pap:  yes MMG:  guidelines reviewed Colonoscopy:  guidelines reviewed Screening Labs: done 03/01/2021   reports that she has never smoked. She has never used smokeless tobacco. She reports current drug use. Drug: Marijuana. She reports that she does not drink alcohol.  Past Medical History:  Diagnosis Date   Asthma    occasionally, exercise-induced; uses inhaler; not used "in a while"   Cannabis hyperemesis syndrome concurrent with and due to cannabis abuse 08/02/2018   Gestational hypertension 01/25/2019   Hemorrhoids during puerperium with postpartum complication 01/31/2019   Hypertension    started as gestational   Marijuana use 08/24/2016   Positive tox screen in Jan 2018   Scoliosis     Past Surgical History:  Procedure Laterality Date   NO PAST SURGERIES      Current Outpatient Medications  Medication Sig Dispense Refill   albuterol (VENTOLIN HFA) 108 (90 Base) MCG/ACT inhaler Inhale 1-2 puffs into the lungs every 4 (four) hours as needed for wheezing. 8 g 5   Blood Pressure KIT Z34.90 To monitored regularly at home Large 1 each 0   busPIRone (BUSPAR) 5 MG tablet Take 1 tablet (5 mg total) by mouth 3 (three) times daily as needed. 90 tablet 11   COVID-19 Ad26 vaccine, JANSSEN/J&J, 0.5 ML injection Inject into the muscle. 0.5 mL 0   norethindrone (MICRONOR) 0.35 MG tablet Take 1 tablet (0.35 mg total) by mouth daily. 84 tablet 1   sertraline (ZOLOFT) 50 MG tablet Take 1 tablet (50 mg total) by mouth daily. 30 tablet 3    NIFEdipine (ADALAT CC) 60 MG 24 hr tablet Take 1 tablet (60 mg total) by mouth daily. 90 tablet 1   No current facility-administered medications for this visit.    Family History  Problem Relation Age of Onset   Diabetes Father     ROS: Constitutional: negative Genitourinary:negative  Exam:   BP (!) 160/88   Pulse 69   Ht 5\' 6"  (1.676 m) Comment: reported  Wt 182 lb 9.6 oz (82.8 kg)   BMI 29.47 kg/m   Height: 5\' 6"  (167.6 cm) (reported)  General appearance: alert, cooperative and appears stated age Head: Normocephalic, without obvious abnormality, atraumatic Neck: no adenopathy, supple, symmetrical, trachea midline and thyroid normal to inspection and palpation Lungs: clear to auscultation bilaterally Breasts: normal appearance, no masses or tenderness Heart: regular rate and rhythm Abdomen: soft, non-tender; bowel sounds normal; no masses,  no organomegaly Extremities: extremities normal, atraumatic, no cyanosis or edema Skin: Skin color, texture, turgor normal. No rashes or lesions Lymph nodes: Cervical, supraclavicular, and axillary nodes normal. No abnormal inguinal nodes palpated Neurologic: Grossly normal   Pelvic: External genitalia:  no lesions              Urethra:  normal appearing urethra with no masses, tenderness or lesions              Bartholins and Skenes: normal  Vagina: normal appearing vagina with normal color and no discharge, no lesions              Cervix: no lesions              Pap taken: Yes.   Bimanual Exam:  Uterus:  normal size, contour, position, consistency, mobility, non-tender              Adnexa: normal adnexa               Rectovaginal: Confirms               Anus:  normal sphincter tone, no lesions  Chaperone, Ina Homes, CMA, was present for exam.  Assessment/Plan: 1. Well woman exam with routine gynecological exam - Pap smear obtained today with HR HPV - Mammogram guidelines reviewed - Colonoscopy guidelines  reviewede - lab work done with PCP, Enid Skeens, 02/2021 - vaccines reviewed/updated  2. Cervical cancer screening - Cytology - PAP( [place])  3. Primary hypertension - NIFEdipine (ADALAT CC) 60 MG 24 hr tablet; Take 1 tablet (60 mg total) by mouth daily.  Dispense: 90 tablet; Refill: 1 - will help pt with referral to PCP is needed  4. PCOS (polycystic ovarian syndrome)

## 2022-05-20 ENCOUNTER — Other Ambulatory Visit: Payer: Self-pay | Admitting: Obstetrics and Gynecology

## 2022-05-20 DIAGNOSIS — Z3480 Encounter for supervision of other normal pregnancy, unspecified trimester: Secondary | ICD-10-CM

## 2022-05-22 ENCOUNTER — Encounter (HOSPITAL_BASED_OUTPATIENT_CLINIC_OR_DEPARTMENT_OTHER): Payer: Self-pay | Admitting: Obstetrics & Gynecology

## 2022-05-23 LAB — CYTOLOGY - PAP
Comment: NEGATIVE
Diagnosis: NEGATIVE
High risk HPV: NEGATIVE

## 2022-06-06 ENCOUNTER — Encounter (HOSPITAL_BASED_OUTPATIENT_CLINIC_OR_DEPARTMENT_OTHER): Payer: Self-pay

## 2022-06-06 ENCOUNTER — Ambulatory Visit (HOSPITAL_BASED_OUTPATIENT_CLINIC_OR_DEPARTMENT_OTHER): Payer: Medicaid Other | Admitting: Family Medicine

## 2022-06-15 ENCOUNTER — Encounter (HOSPITAL_BASED_OUTPATIENT_CLINIC_OR_DEPARTMENT_OTHER): Payer: Self-pay

## 2022-06-15 ENCOUNTER — Ambulatory Visit (INDEPENDENT_AMBULATORY_CARE_PROVIDER_SITE_OTHER): Payer: Medicaid Other

## 2022-06-15 VITALS — BP 144/91 | HR 72 | Ht 66.0 in | Wt 182.0 lb

## 2022-06-15 DIAGNOSIS — Z3042 Encounter for surveillance of injectable contraceptive: Secondary | ICD-10-CM | POA: Diagnosis not present

## 2022-06-15 MED ORDER — MEDROXYPROGESTERONE ACETATE 150 MG/ML IM SUSP: 150.0000 mg | INTRAMUSCULAR | 0 refills | Status: DC

## 2022-06-15 NOTE — Progress Notes (Signed)
Patient came in today to receive the MedroxyProgesterone Acetate 150mg  injection. Injection was given in the right glu med. Patient tolerated the injection well. Patient was advised to come back between 08/31/22-09/14/2022. Informed patient that 08/31/2022 is the earliest she can get the injection and 09/14/2022 is the latest to receive the injection. Encouraged patient to come in sooner than later in the event she has to reschedule. tbw

## 2022-06-20 MED ORDER — MEDROXYPROGESTERONE ACETATE 150 MG/ML IM SUSY
150.0000 mg | PREFILLED_SYRINGE | Freq: Once | INTRAMUSCULAR | Status: AC
  Administered 2022-06-15: 150 mg via INTRAMUSCULAR

## 2022-07-06 ENCOUNTER — Ambulatory Visit (HOSPITAL_BASED_OUTPATIENT_CLINIC_OR_DEPARTMENT_OTHER): Payer: Medicaid Other | Admitting: Family Medicine

## 2022-07-06 VITALS — BP 148/100 | HR 80 | Ht 66.0 in | Wt 185.0 lb

## 2022-07-06 DIAGNOSIS — I10 Essential (primary) hypertension: Secondary | ICD-10-CM

## 2022-07-06 DIAGNOSIS — M659 Synovitis and tenosynovitis, unspecified: Secondary | ICD-10-CM

## 2022-07-06 MED ORDER — NIFEDIPINE ER OSMOTIC RELEASE 60 MG PO TB24: 60.0000 mg | ORAL_TABLET | Freq: Every day | ORAL | 1 refills | Status: DC

## 2022-07-06 MED ORDER — NAPROXEN SODIUM 275 MG PO TABS: 550.0000 mg | ORAL_TABLET | Freq: Two times a day (BID) | ORAL | 0 refills | Status: DC

## 2022-07-06 NOTE — Progress Notes (Signed)
Established Patient Office Visit  Subjective   Patient ID: Megan Schneider, female    DOB: May 16, 1990  Age: 32 y.o. MRN: [id number]  Chief Complaint  Patient presents with   Hand Pain    Bilateral hand pain, 3 fingers on the left. Back of hand and pinky on the right. Ongoing for about 2 weeks. Has tried arthritis tylenol and muscle aches and pain tylenol   Megan Schneider is a 32 yo female patient who presents today for hand pain x2 weeks. 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 She has difficulty bending her middle finger of the left hand- she states it feels swollen but she does not see that it is swollen physically.   L hand palm and three fingers- constant 7/10, usually at a 10/10 R side of pinky   She is R hand dominant, with no injury Tylenol usually relieves pain    Review of Systems  Constitutional:  Negative for malaise/fatigue.  Eyes:  Negative for blurred vision and double vision.  Respiratory:  Negative for cough and shortness of breath.   Cardiovascular:  Negative for chest pain and palpitations.  Gastrointestinal:  Negative for nausea and vomiting.  Musculoskeletal:  Positive for joint pain (L hand pain). Negative for myalgias.  Neurological:  Negative for dizziness, weakness and headaches.  Psychiatric/Behavioral:  Negative for depression and suicidal ideas. The patient is not nervous/anxious and does not have insomnia.      Objective:     BP (!) 148/100   Pulse 80   Ht 5\' 6"  (1.676 m)   Wt 185 lb (83.9 kg)   SpO2 98%   BMI 29.86 kg/m  BP Readings from Last 3 Encounters:  07/06/22 (!) 148/100  06/15/22 (!) 144/91  05/19/22 (!) 160/88    Physical Exam Constitutional:      Appearance: Normal appearance.  Cardiovascular:     Rate and Rhythm: Normal rate and regular rhythm.     Pulses: Normal pulses.     Heart sounds: Normal heart  sounds.  Pulmonary:     Effort: Pulmonary effort is normal.     Breath sounds: Normal breath sounds.  Musculoskeletal:     Right wrist: No snuff box tenderness.     Left wrist: No snuff box tenderness.     Right hand: Tenderness (along ulnar aspect of hand) present.     Left hand: Tenderness present. No swelling or deformity. Decreased range of motion. Normal strength.  Neurological:     Mental Status: She is alert.  Psychiatric:        Mood and Affect: Mood normal.        Behavior: Behavior normal.        Thought Content: Thought content normal.        Judgment: Judgment normal.    Assessment & Plan:  1. Flexor tenosynovitis of finger Patient presents today for complaints of left hand pain. Physical exam with no erythema, redness or warmth present around DIP, PIP, and MCP joints of the left hand. No fluid accumulation felt in joint spaces. Decreased ROM with middle, ring and pinky fingers of left hand-- most noticeable was the middle finger. Advised patient to reduce repetitive strain, continue used of NSAIDs for pain and inflammation, and 20 minutes of ice as tolerated 3-4 times daily. Counseled patient that Dr. de [place] performs steroid injections if this continues to persist. Advised patient  to return to office if this continues to be bothersome.  - naproxen sodium (ANAPROX) 275 MG tablet; Take 2 tablets (550 mg total) by mouth 2 (two) times daily with a meal.  Dispense: 60 tablet; Refill: 0  2. Primary hypertension Patient presents today with elevated blood pressure. Patient in no acute distress and is well-appearing. Cardiovascular exam with heart regular rate and rhythm. Normal heart sounds, no murmurs present. No lower extremity edema present. Lungs clear to auscultation bilaterally. Patient is currently taking nifedipine 60mg  daily. She reports that her blood pressure rises in the afternoon/evening. Will adjust nifedipine to extended-release. Prescription sent to pharmacy. Advised  patient to closely monitor blood pressure. Return to office sooner if blood pressure begins to increase greater than 130/80. Follow-up in 4 weeks.   - NIFEdipine (PROCARDIA XL/NIFEDICAL XL) 60 MG 24 hr tablet; Take 1 tablet (60 mg total) by mouth daily.  Dispense: 90 tablet; Refill: 1   Return in about 4 weeks (around 08/03/2022) for HTN follow-up.    Alyson Reedy, FNP

## 2022-07-07 ENCOUNTER — Encounter (HOSPITAL_BASED_OUTPATIENT_CLINIC_OR_DEPARTMENT_OTHER): Payer: Self-pay | Admitting: Family Medicine

## 2022-07-07 NOTE — Telephone Encounter (Signed)
Called the pharmacy, got clarification. Spoke with patient via Phone, let her know that Aleve is not covered under her insurance at all, for her blood pressure medication her insurance is refusing it until June 11th. Recommended she call the insurance company for further explanation and get back with [place] regarding her blood pressure medication. Let her know that she can get the Aleve over the counter for cheaper if she prefers.

## 2022-07-08 ENCOUNTER — Encounter (HOSPITAL_BASED_OUTPATIENT_CLINIC_OR_DEPARTMENT_OTHER): Payer: Self-pay | Admitting: Family Medicine

## 2022-07-12 ENCOUNTER — Other Ambulatory Visit (HOSPITAL_BASED_OUTPATIENT_CLINIC_OR_DEPARTMENT_OTHER): Payer: Self-pay | Admitting: Family Medicine

## 2022-07-12 DIAGNOSIS — I10 Essential (primary) hypertension: Secondary | ICD-10-CM

## 2022-07-12 MED ORDER — AMLODIPINE BESYLATE 2.5 MG PO TABS: 2.5000 mg | ORAL_TABLET | Freq: Every day | ORAL | 1 refills | Status: DC

## 2022-08-03 ENCOUNTER — Ambulatory Visit (HOSPITAL_BASED_OUTPATIENT_CLINIC_OR_DEPARTMENT_OTHER): Payer: Medicaid Other | Admitting: Family Medicine

## 2022-08-04 ENCOUNTER — Encounter (HOSPITAL_BASED_OUTPATIENT_CLINIC_OR_DEPARTMENT_OTHER): Payer: Self-pay | Admitting: Family Medicine

## 2022-08-12 ENCOUNTER — Encounter (HOSPITAL_BASED_OUTPATIENT_CLINIC_OR_DEPARTMENT_OTHER): Payer: Self-pay | Admitting: Obstetrics & Gynecology

## 2022-09-05 ENCOUNTER — Encounter (HOSPITAL_BASED_OUTPATIENT_CLINIC_OR_DEPARTMENT_OTHER): Payer: Self-pay

## 2022-09-05 ENCOUNTER — Ambulatory Visit (INDEPENDENT_AMBULATORY_CARE_PROVIDER_SITE_OTHER): Payer: Medicaid Other

## 2022-09-05 VITALS — BP 158/109 | HR 68 | Ht 66.0 in | Wt 190.8 lb

## 2022-09-05 DIAGNOSIS — Z3042 Encounter for surveillance of injectable contraceptive: Secondary | ICD-10-CM | POA: Diagnosis not present

## 2022-09-05 MED ORDER — MEDROXYPROGESTERONE ACETATE 150 MG/ML IM SUSY
150.0000 mg | PREFILLED_SYRINGE | Freq: Once | INTRAMUSCULAR | Status: AC
Start: 2022-09-05 — End: 2022-09-05
  Administered 2022-09-05: 150 mg via INTRAMUSCULAR

## 2022-09-05 NOTE — Progress Notes (Signed)
       Patient came in today to receive the MedroxyProgesterone Acetate 150mg  injection. Injection was given in the right glu med. Patient tolerated the injection well. Patient was advised to come back between 11/21/22-12/05/2022. Informed patient that 11/21/2022 is the earliest she can get the injection and 12/05/2022 is the latest to receive the injection. Encouraged patient to come in sooner than later in the event she has to reschedule. tbw

## 2022-09-12 ENCOUNTER — Ambulatory Visit (HOSPITAL_BASED_OUTPATIENT_CLINIC_OR_DEPARTMENT_OTHER): Payer: Medicaid Other | Admitting: Family Medicine

## 2022-09-19 ENCOUNTER — Ambulatory Visit (HOSPITAL_BASED_OUTPATIENT_CLINIC_OR_DEPARTMENT_OTHER): Payer: Medicaid Other

## 2022-10-22 ENCOUNTER — Encounter (HOSPITAL_BASED_OUTPATIENT_CLINIC_OR_DEPARTMENT_OTHER): Payer: Self-pay | Admitting: Family Medicine

## 2022-11-02 ENCOUNTER — Encounter: Payer: Self-pay | Admitting: Pharmacist

## 2022-11-08 ENCOUNTER — Ambulatory Visit (HOSPITAL_BASED_OUTPATIENT_CLINIC_OR_DEPARTMENT_OTHER): Payer: Medicaid Other | Admitting: Family Medicine

## 2022-11-08 ENCOUNTER — Encounter (HOSPITAL_BASED_OUTPATIENT_CLINIC_OR_DEPARTMENT_OTHER): Payer: Self-pay | Admitting: Family Medicine

## 2022-11-08 VITALS — BP 154/97 | HR 83 | Ht 66.0 in | Wt 187.0 lb

## 2022-11-08 DIAGNOSIS — I10 Essential (primary) hypertension: Secondary | ICD-10-CM

## 2022-11-08 MED ORDER — AMLODIPINE BESYLATE 10 MG PO TABS
10.0000 mg | ORAL_TABLET | Freq: Every day | ORAL | 2 refills | Status: DC
Start: 2022-11-08 — End: 2023-01-05

## 2022-11-08 NOTE — Assessment & Plan Note (Signed)
Patient presents today with elevated blood pressure. Patient in no acute distress and is well-appearing. Denies chest pain, shortness of breath, lower extremity edema, vision changes, headaches. Cardiovascular exam with heart regular rate and rhythm. Normal heart sounds, no murmurs present. No lower extremity edema present. Lungs clear to auscultation bilaterally. Patient is currently taking amlodipine 5mg  daily. Plan to increase to 10mg  daily. Advised patient to reach out if her headaches persist daily. Refills provided today. Advised patient to continue to monitor blood pressure at home. Return to office sooner if blood pressure begins to increase greater than 130/80. Follow-up in 6 weeks.

## 2022-11-08 NOTE — Progress Notes (Signed)
Established Patient Office Visit  Subjective   Patient ID: Megan Schneider, female    DOB: [birth year]  Age: 32 y.o. MRN: [id number]  Chief Complaint  Patient presents with   Hypertension   HYPERTENSION: Megan Schneider is a 32 year old female patient who presents for the medical management of hypertension.  Patient's current hypertension medication regimen is: took amlodipine 2.5mg  daily for 2 weeks and then increased to 5mg .  Patient is currently taking prescribed medications for HTN.  Patient is regularly keeping a check on BP at home. Home readings are: 165/96, 132/85, 158/102, 149/82, 145/86, 147/96, 136/95 Adhering to low sodium diet: yes Exercising Regularly: yes Denies headache, dizziness, CP, SHOB, vision changes.    BP Readings from Last 3 Encounters:  11/08/22 (!) 154/97  09/05/22 (!) 158/109  07/06/22 (!) 148/100    Review of Systems  Constitutional:  Negative for malaise/fatigue.  Eyes:  Negative for blurred vision and double vision.  Respiratory:  Negative for cough and shortness of breath.   Cardiovascular:  Negative for chest pain, palpitations and claudication.  Gastrointestinal:  Negative for abdominal pain, nausea and vomiting.  Musculoskeletal:  Negative for myalgias.  Neurological:  Positive for headaches (occ.). Negative for dizziness and weakness.  Psychiatric/Behavioral:  Negative for depression and suicidal ideas. The patient is not nervous/anxious.       Objective:     BP (!) 154/97   Pulse 83   Ht 5\' 6"  (1.676 m)   Wt 187 lb (84.8 kg)   BMI 30.18 kg/m  BP Readings from Last 3 Encounters:  11/08/22 (!) 154/97  09/05/22 (!) 158/109  07/06/22 (!) 148/100     Physical Exam Constitutional:      Appearance: Normal appearance.  Cardiovascular:     Rate and Rhythm: Normal rate and regular rhythm.     Pulses: Normal pulses.     Heart sounds: Normal heart sounds.  Pulmonary:     Effort: Pulmonary effort is normal.     Breath sounds:  Normal breath sounds.  Neurological:     Mental Status: She is alert.  Psychiatric:        Mood and Affect: Mood normal.        Behavior: Behavior normal.        Thought Content: Thought content normal.        Judgment: Judgment normal.     Assessment & Plan:  Primary hypertension Assessment & Plan: Patient presents today with elevated blood pressure. Patient in no acute distress and is well-appearing. Denies chest pain, shortness of breath, lower extremity edema, vision changes, headaches. Cardiovascular exam with heart regular rate and rhythm. Normal heart sounds, no murmurs present. No lower extremity edema present. Lungs clear to auscultation bilaterally. Patient is currently taking amlodipine 5mg  daily. Plan to increase to 10mg  daily. Advised patient to reach out if her headaches persist daily. Refills provided today. Advised patient to continue to monitor blood pressure at home. Return to office sooner if blood pressure begins to increase greater than 130/80. Follow-up in 6 weeks.      Orders: -     amLODIPine Besylate; Take 1 tablet (10 mg total) by mouth daily.  Dispense: 30 tablet; Refill: 2    Return in about 6 weeks (around 12/20/2022) for HTN follow-up.    Alyson Reedy, FNP

## 2022-11-28 ENCOUNTER — Encounter (HOSPITAL_BASED_OUTPATIENT_CLINIC_OR_DEPARTMENT_OTHER): Payer: Self-pay | Admitting: Family Medicine

## 2022-12-05 ENCOUNTER — Ambulatory Visit (HOSPITAL_BASED_OUTPATIENT_CLINIC_OR_DEPARTMENT_OTHER): Payer: Medicaid Other

## 2022-12-07 ENCOUNTER — Ambulatory Visit (INDEPENDENT_AMBULATORY_CARE_PROVIDER_SITE_OTHER): Payer: Medicaid Other

## 2022-12-07 ENCOUNTER — Encounter (HOSPITAL_BASED_OUTPATIENT_CLINIC_OR_DEPARTMENT_OTHER): Payer: Self-pay

## 2022-12-07 VITALS — BP 145/100 | HR 63 | Ht 66.0 in | Wt 188.6 lb

## 2022-12-07 DIAGNOSIS — Z3042 Encounter for surveillance of injectable contraceptive: Secondary | ICD-10-CM

## 2022-12-07 LAB — POCT URINE PREGNANCY: Preg Test, Ur: NEGATIVE

## 2022-12-07 MED ORDER — MEDROXYPROGESTERONE ACETATE 150 MG/ML IM SUSY
150.0000 mg | PREFILLED_SYRINGE | Freq: Once | INTRAMUSCULAR | Status: AC
Start: 2022-12-07 — End: 2022-12-07
  Administered 2022-12-07: 150 mg via INTRAMUSCULAR

## 2022-12-07 NOTE — Progress Notes (Signed)
  Patient came in today to receive the MedroxyProgesterone Acetate 150mg  injection. Pregnancy test performed and was negative. Injection was given in the right glu med. Patient tolerated the injection well. Patient was advised to come back between 02/22/2023-03/08/2023. Informed patient that 02/22/2023 is the earliest she can get the injection and 03/08/2023 is the latest to receive the injection. Encouraged patient to come in sooner than later in the event she has to reschedule. tbw

## 2022-12-21 ENCOUNTER — Ambulatory Visit (HOSPITAL_BASED_OUTPATIENT_CLINIC_OR_DEPARTMENT_OTHER): Payer: Medicaid Other | Admitting: Family Medicine

## 2023-01-05 ENCOUNTER — Encounter (HOSPITAL_BASED_OUTPATIENT_CLINIC_OR_DEPARTMENT_OTHER): Payer: Self-pay | Admitting: Family Medicine

## 2023-01-05 ENCOUNTER — Ambulatory Visit (HOSPITAL_BASED_OUTPATIENT_CLINIC_OR_DEPARTMENT_OTHER): Payer: Medicaid Other | Admitting: Family Medicine

## 2023-01-05 VITALS — BP 162/99 | HR 82 | Wt 192.7 lb

## 2023-01-05 DIAGNOSIS — I10 Essential (primary) hypertension: Secondary | ICD-10-CM

## 2023-01-05 MED ORDER — CHLORTHALIDONE 15 MG PO TABS: 15.0000 mg | ORAL_TABLET | Freq: Every day | ORAL | 2 refills | Status: DC

## 2023-01-05 MED ORDER — AMLODIPINE BESYLATE 10 MG PO TABS
10.0000 mg | ORAL_TABLET | Freq: Every day | ORAL | 2 refills | Status: DC
Start: 2023-01-05 — End: 2023-01-18

## 2023-01-05 NOTE — Progress Notes (Signed)
   Established Patient Office Visit  Subjective   Patient ID: Megan Schneider, female    DOB: 02-12-1991  Age: 32 y.o. MRN: [id number]  Chief Complaint  Patient presents with   Medical Management of Chronic Issues    6 week follow up; pt denies any concerns for today's visit.   HYPERTENSION: Megan Schneider is a 32 year old female patient who presents for the medical management of hypertension.  Patient's current hypertension medication regimen is: amlodipine 10mg   Patient is currently taking prescribed medications for HTN.  Patient is regularly keeping a check on BP at home. Home readings include:  11/12: 174/92 11/13: 140/66 11/15: 132/78 11/17: 132/78 11/18: 159/100 11/21: 147/93 Adhering to low sodium diet: yes Exercising Regularly: yes  Denies dizziness, CP, SHOB, vision changes.   Headaches often.   BP Readings from Last 3 Encounters:  01/05/23 (!) 162/99  12/07/22 (!) 145/100  11/08/22 (!) 154/97   Review of Systems  Constitutional:  Negative for malaise/fatigue and weight loss.  Eyes:  Negative for blurred vision and double vision.  Respiratory:  Negative for cough and shortness of breath.   Cardiovascular:  Negative for chest pain, palpitations and leg swelling.  Gastrointestinal:  Negative for abdominal pain, nausea and vomiting.  Neurological:  Positive for headaches. Negative for dizziness and weakness.  Psychiatric/Behavioral:  Negative for depression and suicidal ideas. The patient does not have insomnia.       Objective:     BP (!) 162/99   Pulse 82   Wt 192 lb 11.2 oz (87.4 kg)   SpO2 99%   BMI 31.10 kg/m  BP Readings from Last 3 Encounters:  01/05/23 (!) 162/99  12/07/22 (!) 145/100  11/08/22 (!) 154/97     Physical Exam Vitals reviewed.  Constitutional:      Appearance: Normal appearance.  Cardiovascular:     Rate and Rhythm: Normal rate and regular rhythm.     Pulses: Normal pulses.     Heart sounds: Normal heart sounds.   Pulmonary:     Effort: Pulmonary effort is normal.     Breath sounds: Normal breath sounds.  Neurological:     Mental Status: She is alert.  Psychiatric:        Mood and Affect: Mood normal.        Behavior: Behavior normal.       Assessment & Plan:   Primary hypertension Assessment & Plan: Patient presents today with slightly elevated blood pressure, repeat blood pressure still elevated. Patient in no acute distress and is well-appearing. Denies chest pain, shortness of breath, lower extremity edema, vision changes, headaches. Cardiovascular exam with heart regular rate and rhythm. Normal heart sounds, no murmurs present. No lower extremity edema present. Lungs clear to auscultation bilaterally. Patient is currently taking amlodipine 10mg  daily. Will add chlorthalidone 15mg  daily to her current medication regimen. Refills provided today. Advised patient to closely monitor blood pressure at home. Follow-up in 4 weeks- plan to repeat BMP at f/u visit.    Orders: -     Chlorthalidone; Take 1 tablet (15 mg total) by mouth daily.  Dispense: 30 tablet; Refill: 2 -     amLODIPine Besylate; Take 1 tablet (10 mg total) by mouth daily.  Dispense: 30 tablet; Refill: 2   Return in about 4 weeks (around 02/02/2023) for HTN follow-up (repeat BMP).    Alyson Reedy, FNP

## 2023-01-05 NOTE — Assessment & Plan Note (Signed)
Patient presents today with slightly elevated blood pressure, repeat blood pressure still elevated. Patient in no acute distress and is well-appearing. Denies chest pain, shortness of breath, lower extremity edema, vision changes, headaches. Cardiovascular exam with heart regular rate and rhythm. Normal heart sounds, no murmurs present. No lower extremity edema present. Lungs clear to auscultation bilaterally. Patient is currently taking amlodipine 10mg  daily. Will add chlorthalidone 15mg  daily to her current medication regimen. Refills provided today. Advised patient to closely monitor blood pressure at home. Follow-up in 4 weeks- plan to repeat BMP at f/u visit.

## 2023-01-09 ENCOUNTER — Encounter (HOSPITAL_BASED_OUTPATIENT_CLINIC_OR_DEPARTMENT_OTHER): Payer: Self-pay | Admitting: Family Medicine

## 2023-01-10 ENCOUNTER — Other Ambulatory Visit (HOSPITAL_BASED_OUTPATIENT_CLINIC_OR_DEPARTMENT_OTHER): Payer: Self-pay

## 2023-01-10 ENCOUNTER — Ambulatory Visit (HOSPITAL_BASED_OUTPATIENT_CLINIC_OR_DEPARTMENT_OTHER): Payer: Medicaid Other | Admitting: Family Medicine

## 2023-01-10 MED ORDER — CHLORTHALIDONE 25 MG PO TABS: 25.0000 mg | ORAL_TABLET | Freq: Every day | ORAL | 0 refills | Status: DC

## 2023-01-10 NOTE — Telephone Encounter (Signed)
Pt scheduled appt to come in office to evaluate. Called pt felt a little better this morning but bp still high and would like to be evaluated

## 2023-01-11 ENCOUNTER — Encounter (HOSPITAL_BASED_OUTPATIENT_CLINIC_OR_DEPARTMENT_OTHER): Payer: Self-pay | Admitting: Family Medicine

## 2023-01-11 NOTE — Telephone Encounter (Signed)
Jon Gills, can you please review mychart messages sent and advise. Foye Clock pt but she is out of the office.

## 2023-01-16 NOTE — Telephone Encounter (Signed)
Called pt and moved appt up to 12/4 to discuss the bp

## 2023-01-18 ENCOUNTER — Encounter (HOSPITAL_BASED_OUTPATIENT_CLINIC_OR_DEPARTMENT_OTHER): Payer: Self-pay | Admitting: Family Medicine

## 2023-01-18 ENCOUNTER — Ambulatory Visit (HOSPITAL_BASED_OUTPATIENT_CLINIC_OR_DEPARTMENT_OTHER): Payer: Medicaid Other | Admitting: Family Medicine

## 2023-01-18 VITALS — BP 139/97 | HR 114 | Ht 66.0 in | Wt 187.7 lb

## 2023-01-18 DIAGNOSIS — I10 Essential (primary) hypertension: Secondary | ICD-10-CM

## 2023-01-18 NOTE — Progress Notes (Signed)
   Established Patient Office Visit  Subjective   Patient ID: Megan Schneider, female    DOB: 07/19/1990  Age: 32 y.o. MRN: [id number]  Chief Complaint  Patient presents with   Hypertension    137/97, 149/82, 142/82, 164/109 are some home readings    HYPERTENSION: Megan Schneider is a pleasant 32 year old female patient who presents for the medical management of hypertension.  Patient's current hypertension medication regimen is: chlorthalidone 25mg  daily  She stopped taking amlodipine 10mg  due to frequent migraines Patient is currently taking prescribed medications for HTN.  Patient is regularly keeping a check on BP at home (see above).  Denies headache, dizziness, CP, SHOB, vision changes.    BP Readings from Last 3 Encounters:  01/18/23 (!) 139/97  01/05/23 (!) 162/99  12/07/22 (!) 145/100   Review of Systems  Constitutional:  Negative for malaise/fatigue.  Eyes:  Negative for blurred vision and double vision.  Respiratory:  Negative for cough and shortness of breath.   Cardiovascular:  Negative for chest pain, palpitations, orthopnea, leg swelling and PND.  Gastrointestinal:  Negative for nausea and vomiting.  Neurological:  Negative for dizziness, speech change, focal weakness and headaches.  Psychiatric/Behavioral:  Negative for depression and suicidal ideas. The patient is not nervous/anxious and does not have insomnia.     Objective:    BP (!) 139/97   Pulse (!) 114   Ht 5\' 6"  (1.676 m)   Wt 187 lb 11.2 oz (85.1 kg)   SpO2 99%   BMI 30.30 kg/m  BP Readings from Last 3 Encounters:  01/18/23 (!) 139/97  01/05/23 (!) 162/99  12/07/22 (!) 145/100    Physical Exam Vitals reviewed.  Constitutional:      Appearance: Normal appearance.  Cardiovascular:     Rate and Rhythm: Normal rate and regular rhythm.     Pulses: Normal pulses.     Heart sounds: Normal heart sounds.  Pulmonary:     Effort: Pulmonary effort is normal.     Breath sounds: Normal breath  sounds.  Musculoskeletal:     Right lower leg: No edema.     Left lower leg: No edema.  Neurological:     Mental Status: She is alert.  Psychiatric:        Mood and Affect: Mood normal.        Behavior: Behavior normal.     Assessment & Plan:   1. Primary hypertension Patient presents today with slightly elevated blood pressure. Patient in no acute distress and is well-appearing. Denies chest pain, shortness of breath, lower extremity edema, vision changes, headaches. Cardiovascular exam with heart regular rate and rhythm. Normal heart sounds, no murmurs present. No lower extremity edema present. Lungs clear to auscultation bilaterally. Patient is currently taking chlorthalidone 25mg  daily. No refills needed. Will check BMP today, if kidney function is normal, then reasonable to initiate ACEI/ARB due to continued elevated blood pressure readings. Advised patient to closely monitor blood pressure at home. Plan to follow-up in 2 weeks with phone call regarding home blood pressure readings. Discussed to return to office sooner if blood pressure is greater than 130/80.  - Basic Metabolic Panel (BMET)  Return in about 3 months (around 04/18/2023) for HTN follow-up.   Spent 20 minutes on this patient encounter, including preparation, chart review, face-to-face counseling with patient and coordination of care, and documentation of encounter.   Alyson Reedy, FNP

## 2023-01-19 LAB — BASIC METABOLIC PANEL
BUN/Creatinine Ratio: 17 (ref 9–23)
BUN: 19 mg/dL (ref 6–20)
CO2: 26 mmol/L (ref 20–29)
Calcium: 10.1 mg/dL (ref 8.7–10.2)
Chloride: 99 mmol/L (ref 96–106)
Creatinine, Ser: 1.15 mg/dL — ABNORMAL HIGH (ref 0.57–1.00)
Glucose: 87 mg/dL (ref 70–99)
Potassium: 3.4 mmol/L — ABNORMAL LOW (ref 3.5–5.2)
Sodium: 141 mmol/L (ref 134–144)
eGFR: 66 mL/min/{1.73_m2} (ref >59)

## 2023-01-20 ENCOUNTER — Encounter (HOSPITAL_BASED_OUTPATIENT_CLINIC_OR_DEPARTMENT_OTHER): Payer: Self-pay | Admitting: Family Medicine

## 2023-01-25 ENCOUNTER — Other Ambulatory Visit (HOSPITAL_BASED_OUTPATIENT_CLINIC_OR_DEPARTMENT_OTHER): Payer: Self-pay

## 2023-01-25 DIAGNOSIS — R7989 Other specified abnormal findings of blood chemistry: Secondary | ICD-10-CM

## 2023-02-02 ENCOUNTER — Ambulatory Visit (HOSPITAL_BASED_OUTPATIENT_CLINIC_OR_DEPARTMENT_OTHER): Payer: Medicaid Other | Admitting: Family Medicine

## 2023-02-02 ENCOUNTER — Other Ambulatory Visit (HOSPITAL_BASED_OUTPATIENT_CLINIC_OR_DEPARTMENT_OTHER): Payer: Medicaid Other

## 2023-02-02 ENCOUNTER — Encounter (HOSPITAL_BASED_OUTPATIENT_CLINIC_OR_DEPARTMENT_OTHER): Payer: Self-pay

## 2023-02-02 ENCOUNTER — Other Ambulatory Visit (HOSPITAL_BASED_OUTPATIENT_CLINIC_OR_DEPARTMENT_OTHER): Payer: Self-pay

## 2023-02-02 MED ORDER — AMLODIPINE BESYLATE 2.5 MG PO TABS: 2.5000 mg | ORAL_TABLET | Freq: Every day | ORAL | 1 refills | Status: DC

## 2023-02-03 LAB — BASIC METABOLIC PANEL
BUN/Creatinine Ratio: 15 (ref 9–23)
BUN: 17 mg/dL (ref 6–20)
CO2: 22 mmol/L (ref 20–29)
Calcium: 9.5 mg/dL (ref 8.7–10.2)
Chloride: 107 mmol/L — ABNORMAL HIGH (ref 96–106)
Creatinine, Ser: 1.17 mg/dL — ABNORMAL HIGH (ref 0.57–1.00)
Glucose: 103 mg/dL — ABNORMAL HIGH (ref 70–99)
Potassium: 3.5 mmol/L (ref 3.5–5.2)
Sodium: 144 mmol/L (ref 134–144)
eGFR: 64 mL/min/{1.73_m2} (ref >59)

## 2023-02-21 ENCOUNTER — Other Ambulatory Visit (HOSPITAL_BASED_OUTPATIENT_CLINIC_OR_DEPARTMENT_OTHER): Payer: Self-pay | Admitting: Family Medicine

## 2023-02-21 DIAGNOSIS — I10 Essential (primary) hypertension: Secondary | ICD-10-CM

## 2023-02-21 MED ORDER — OLMESARTAN MEDOXOMIL 5 MG PO TABS: 5.0000 mg | ORAL_TABLET | Freq: Every day | ORAL | 3 refills | Status: DC

## 2023-02-22 ENCOUNTER — Ambulatory Visit (HOSPITAL_BASED_OUTPATIENT_CLINIC_OR_DEPARTMENT_OTHER): Payer: Medicaid Other

## 2023-02-22 VITALS — BP 118/76 | HR 87 | Ht 66.0 in | Wt 195.0 lb

## 2023-02-22 DIAGNOSIS — Z3042 Encounter for surveillance of injectable contraceptive: Secondary | ICD-10-CM | POA: Diagnosis not present

## 2023-02-22 MED ORDER — MEDROXYPROGESTERONE ACETATE 150 MG/ML IM SUSY
150.0000 mg | PREFILLED_SYRINGE | Freq: Once | INTRAMUSCULAR | Status: AC
  Administered 2023-02-22: 150 mg via INTRAMUSCULAR

## 2023-02-22 NOTE — Progress Notes (Signed)
 Pt here for depo injection. Pt tolerated injection well. Injection was administered in the right ventrogluteal. Pt will return in 12 weeks.

## 2023-03-27 ENCOUNTER — Encounter: Payer: Self-pay | Admitting: Family Medicine

## 2023-03-28 ENCOUNTER — Encounter (HOSPITAL_BASED_OUTPATIENT_CLINIC_OR_DEPARTMENT_OTHER): Payer: Self-pay

## 2023-03-28 ENCOUNTER — Ambulatory Visit: Payer: Self-pay | Admitting: Family Medicine

## 2023-03-28 ENCOUNTER — Encounter (HOSPITAL_BASED_OUTPATIENT_CLINIC_OR_DEPARTMENT_OTHER): Payer: Self-pay | Admitting: Family Medicine

## 2023-03-28 ENCOUNTER — Ambulatory Visit (HOSPITAL_BASED_OUTPATIENT_CLINIC_OR_DEPARTMENT_OTHER): Payer: Medicaid Other | Admitting: Family Medicine

## 2023-03-28 ENCOUNTER — Ambulatory Visit: Payer: Medicaid Other

## 2023-03-28 VITALS — BP 134/97 | HR 115 | Temp 98.6°F | Ht 66.0 in | Wt 193.1 lb

## 2023-03-28 DIAGNOSIS — R051 Acute cough: Secondary | ICD-10-CM | POA: Diagnosis not present

## 2023-03-28 LAB — POCT INFLUENZA A/B
Influenza A, POC: NEGATIVE
Influenza B, POC: NEGATIVE

## 2023-03-28 LAB — POCT COVID BINAXNOW CARD: SARS Coronavirus 2 Ag: NEGATIVE

## 2023-03-28 MED ORDER — BENZONATATE 200 MG PO CAPS: 200.0000 mg | ORAL_CAPSULE | Freq: Three times a day (TID) | ORAL | 0 refills | Status: DC | PRN

## 2023-03-28 NOTE — Progress Notes (Signed)
    Procedures performed today:    None.  Independent interpretation of notes and tests performed by another provider:   None.  Brief History, Exam, Impression, and Recommendations:    BP (!) 134/97 (BP Location: Left Arm, Patient Position: Sitting, Cuff Size: Normal)   Pulse (!) 115   Temp 98.6 F (37 C)   Ht 5\' 6"  (1.676 m)   Wt 193 lb 1.6 oz (87.6 kg)   SpO2 100%   BMI 31.17 kg/m   Acute cough Assessment & Plan: Patient presents for evaluation of cough, fever, body aches, sore throat.  She reports that her son recently tested positive for flu and she has had above symptoms which have kept her home from work.  She is thus needing to have evaluation today, work note provided as well.  She has been utilizing OTC measures to help with symptoms.  She has had some nausea, developed some vomiting at symptom onset a few days ago, has not had any further issues since then.  Has had decreased appetite, however has been working to stay well-hydrated.  Last fever was yesterday, no fevers yet today.  Has had mild chest discomfort with coughing, however denies any shortness of breath or trouble breathing. On exam, patient is in no acute distress, vital signs stable.  Cardiovascular exam with regular rate and rhythm on auscultation, borderline tachycardia.  Lungs clear to auscultation bilaterally.  Mild pharyngeal erythema, no tender cervical lymphadenopathy. Surprisingly, point-of-care testing for influenza and coronavirus were both negative in office today.  Discussed likely viral etiology of current symptoms.  Recommend continuing with conservative measures, symptomatic management.  Prescription for Jerilynn Som sent to pharmacy to help with cough.  Reviewed OTC medications to utilize to help with symptoms. Note provided for work today.  Advised on staying home until least 24 hours after last fever and no longer requiring fever reducing medication.  Discussed ED precautions as well as returning  to office should symptoms not improve as expected. Will plan for follow-up in about 2 months for her to reestablish with new provider here in the office  Orders: -     POCT Influenza A/B -     POCT COVID BINAX NOW CARD  Other orders -     Benzonatate; Take 1 capsule (200 mg total) by mouth 3 (three) times daily as needed for cough.  Dispense: 45 capsule; Refill: 0  Return in about 2 months (around 05/26/2023) for hypertension.   ___________________________________________ Romello Hoehn de [place], MD, ABFM, CAQSM Primary Care and Sports Medicine [facility]

## 2023-03-28 NOTE — Patient Instructions (Signed)
  Medication Instructions:  Your physician recommends that you continue on your current medications as directed. Please refer to the Current Medication list given to you today. --If you need a refill on any your medications before your next appointment, please call your pharmacy first. If no refills are authorized on file call the office.--     Follow-Up: Your next appointment:   Your physician recommends that you schedule a follow-up appointment in: 2 months with either provider   You will receive a text message or e-mail with a link to a survey about your care and experience with [place] today! We would greatly appreciate your feedback!   Thanks for letting us be apart of your health journey!!  Primary Care and Sports Medicine   Dr. Ceasar Mons [place]   We encourage you to activate your patient portal called "MyChart".  Sign up information is provided on this After Visit Summary.  MyChart is used to connect with patients for Virtual Visits (Telemedicine).  Patients are able to view lab/test results, encounter notes, upcoming appointments, etc.  Non-urgent messages can be sent to your provider as well. To learn more about what you can do with MyChart, please visit --  [email or web address].

## 2023-03-28 NOTE — Assessment & Plan Note (Signed)
Patient presents for evaluation of cough, fever, body aches, sore throat.  She reports that her son recently tested positive for flu and she has had above symptoms which have kept her home from work.  She is thus needing to have evaluation today, work note provided as well.  She has been utilizing OTC measures to help with symptoms.  She has had some nausea, developed some vomiting at symptom onset a few days ago, has not had any further issues since then.  Has had decreased appetite, however has been working to stay well-hydrated.  Last fever was yesterday, no fevers yet today.  Has had mild chest discomfort with coughing, however denies any shortness of breath or trouble breathing. On exam, patient is in no acute distress, vital signs stable.  Cardiovascular exam with regular rate and rhythm on auscultation, borderline tachycardia.  Lungs clear to auscultation bilaterally.  Mild pharyngeal erythema, no tender cervical lymphadenopathy. Surprisingly, point-of-care testing for influenza and coronavirus were both negative in office today.  Discussed likely viral etiology of current symptoms.  Recommend continuing with conservative measures, symptomatic management.  Prescription for Jerilynn Som sent to pharmacy to help with cough.  Reviewed OTC medications to utilize to help with symptoms. Note provided for work today.  Advised on staying home until least 24 hours after last fever and no longer requiring fever reducing medication.  Discussed ED precautions as well as returning to office should symptoms not improve as expected. Will plan for follow-up in about 2 months for her to reestablish with new provider here in the office

## 2023-03-28 NOTE — Telephone Encounter (Signed)
Copied from CRM [id number]. Topic: Clinical - Red Word Triage >> Mar 28, 2023  8:24 AM Prudencio Pair wrote: Red Word that prompted transfer to Nurse Triage: Patient is requesting an appt to be tested for flu & pneumonia. States she has chills, body aches & fever of 101.9 but states it broke last night. Pt states son tested positive for flu Friday & she just was trying to treat herself accordingly.   Chief Complaint: Flu suspected Symptoms: productive cough, body aches, migraine, chills, sweats Frequency: ongoing since Saturday Pertinent Negatives: Patient denies fever Disposition: [] ED /[] Urgent Care (no appt availability in office) / [x] Appointment(In office/virtual)/ []  [facility]/ [] Home Care/ [] Refused Recommended Disposition /[] [place] [facility]/ []  Follow-up with PCP Additional Notes: The patient reported that her son was diagnosed with the Flu Friday; he lives with her.  She started having symptoms Saturday.  Her symptoms include a productive cough with milky sputum, chills, sweats, body aches, chest pain with coughing and deep breaths, a migraine unrelieved by Tylenol, and sore throat with swallowing.  She called out of work due to her symptoms and her job is requesting a flu swab to confirm diagnosis.  She also requested testing to rule out pneumonia.  She was scheduled for a same day appointment for further evaluation.     Reason for Disposition  [1] Using nasal washes and pain medicine > 24 hours AND [2] sinus pain (around cheekbone or eye) persists  Answer Assessment - Initial Assessment Questions 1. TYPE of EXPOSURE: "How were you exposed?" (e.g., close contact, not a close contact)     Son diagnosed Flu and lives with patient  2. DATE of EXPOSURE: "When did the exposure occur?" (e.g., hour, days, weeks)     Son diagnosed Friday afternoon  3. PREGNANCY: "Is there any chance you are pregnant?" "When was your last menstrual period?"     No  4. HIGH RISK for  COMPLICATIONS: "Do you have any heart or lung problems?" "Do you have a weakened immune system?" (e.g., CHF, COPD, asthma, HIV positive, chemotherapy, renal failure, diabetes mellitus, sickle cell anemia)     Asthma and hypertension  5. SYMPTOMS: "Do you have any symptoms?" (e.g., cough, fever, sore throat, difficulty breathing).     Cough - milky color, congestion, chest pain - when coughing and taking a deep breath, feels rattle in chest with deep breath almost like a wheeze, migraine, sweats, chills  Answer Assessment - Initial Assessment Questions 1. WORST SYMPTOM: "What is your worst symptom?" (e.g., cough, runny nose, muscle aches, headache, sore throat, fever)      Productive cough, migraine, chest pain with coughing, sore throat with swallowing, sweats, and chills 2. ONSET: "When did your flu symptoms start?"      Saturday night 3. COUGH: "How bad is the cough?"       Productive cough  4. RESPIRATORY DISTRESS: "Describe your breathing."      None  5. FEVER: "Do you have a fever?" If Yes, ask: "What is your temperature, how was it measured, and when did it start?"     Not today  6. EXPOSURE: "Were you exposed to someone with influenza?"       Yes, son 7. FLU VACCINE: "Did you get a flu shot this year?"     No  8. HIGH RISK DISEASE: "Do you have any chronic medical problems?" (e.g., heart or lung disease, asthma, weak immune system, or other HIGH RISK conditions)     asthma 9.  PREGNANCY: "Is there any chance you are pregnant?" "When was your last menstrual period?"     No  10. OTHER SYMPTOMS: "Do you have any other symptoms?"  (e.g., runny nose, muscle aches, headache, sore throat)       migraine, chest pain with coughing, sore throat with swallowing, sweats, and chills  Protocols used: Influenza (Flu) Exposure-A-AH, Influenza (Flu) - Seasonal-A-AH

## 2023-03-30 ENCOUNTER — Ambulatory Visit (HOSPITAL_BASED_OUTPATIENT_CLINIC_OR_DEPARTMENT_OTHER): Payer: Self-pay | Admitting: Family Medicine

## 2023-03-30 NOTE — Telephone Encounter (Signed)
  Chief Complaint: Shortness of breath,  Symptoms: congestion, body aches,  Frequency: started Saturday Pertinent Negatives: Patient denies chest pain Disposition: [x] ED /[] Urgent Care (no appt availability in office) / [] Appointment(In office/virtual)/ []  [facility]/ [] Home Care/ [] Refused Recommended Disposition /[] [place] [facility]/ []  Follow-up with PCP Additional Notes: patient called very upset crying with complaints of shortness of breath. Patient with hx of asthma states she is at work and shortness of breath came on suddenly. Patient states she didn't have her inhaler with her-endorses that she hadn't needed to use her inhaler. Patient states a fellow employee was calling 911. Patient was told that the recommendation would be to call 911 and go to the ED. Patient verbalized understanding and all questions answered. Patient was asked if she wanted to remain on the phone with RN-pt has coworkers around her and she hung up with triage.      Copied from CRM [id number]. Topic: Clinical - Red Word Triage >> Mar 30, 2023 12:49 PM Eunice Blase wrote: Red Word that prompted transfer to Nurse Triage: Pt called having trouble breathing, congestion, body aches. Reason for Disposition  SEVERE difficulty breathing (e.g., struggling for each breath, speaks in single words)  Answer Assessment - Initial Assessment Questions 1. RESPIRATORY STATUS: "Describe your breathing?" (e.g., wheezing, shortness of breath, unable to speak, severe coughing)      Shortness of breath 2. ONSET: "When did this breathing problem begin?"      Started Saturday 3. PATTERN "Does the difficult breathing come and go, or has it been constant since it started?"      constant 4. SEVERITY: "How bad is your breathing?" (e.g., mild, moderate, severe)    - MILD: No SOB at rest, mild SOB with walking, speaks normally in sentences, can lie down, no retractions, pulse < 100.    - MODERATE: SOB at rest, SOB with minimal  exertion and prefers to sit, cannot lie down flat, speaks in phrases, mild retractions, audible wheezing, pulse 100-120.    - SEVERE: Very SOB at rest, speaks in single words, struggling to breathe, sitting hunched forward, retractions, pulse > 120      Severe 5. RECURRENT SYMPTOM: "Have you had difficulty breathing before?" If Yes, ask: "When was the last time?" and "What happened that time?"      unsure 6. CARDIAC HISTORY: "Do you have any history of heart disease?" (e.g., heart attack, angina, bypass surgery, angioplasty)      No 7. LUNG HISTORY: "Do you have any history of lung disease?"  (e.g., pulmonary embolus, asthma, emphysema)     asthma 8. CAUSE: "What do you think is causing the breathing problem?"      unsure 9. OTHER SYMPTOMS: "Do you have any other symptoms? (e.g., dizziness, runny nose, cough, chest pain, fever)     Cough, congestion, body 10. O2 SATURATION MONITOR:  "Do you use an oxygen saturation monitor (pulse oximeter) at home?" If Yes, ask: "What is your reading (oxygen level) today?" "What is your usual oxygen saturation reading?" (e.g., 95%)       No 11. PREGNANCY: "Is there any chance you are pregnant?" "When was your last menstrual period?"       No 12. TRAVEL: "Have you traveled out of the country in the last month?" (e.g., travel history, exposures)       No  Protocols used: Breathing Difficulty-A-AH

## 2023-03-30 NOTE — Telephone Encounter (Signed)
Routing to [employer] as an Financial planner.

## 2023-04-11 ENCOUNTER — Encounter (HOSPITAL_BASED_OUTPATIENT_CLINIC_OR_DEPARTMENT_OTHER): Payer: Self-pay | Admitting: Family Medicine

## 2023-04-11 DIAGNOSIS — B370 Candidal stomatitis: Secondary | ICD-10-CM | POA: Insufficient documentation

## 2023-04-11 MED ORDER — NYSTATIN 100000 UNIT/ML MT SUSP: OROMUCOSAL | 0 refills | Status: DC

## 2023-04-11 NOTE — Telephone Encounter (Signed)
 A total of 6 minutes were spent on this encounter today including Mychart messaging and communication to patient, ordering medication, reviewing patient's previous records from Urgent Care, and documenting in the record.

## 2023-04-11 NOTE — Telephone Encounter (Signed)
 Jon Gills, Please see mychart message sent by pt and advise.   Sounds like pt might have thrush.

## 2023-04-12 NOTE — Telephone Encounter (Signed)
 Pt sent mychart message back stating that she is not using a steroid inhaler.

## 2023-05-11 ENCOUNTER — Ambulatory Visit (HOSPITAL_BASED_OUTPATIENT_CLINIC_OR_DEPARTMENT_OTHER): Payer: Medicaid Other

## 2023-05-11 VITALS — BP 147/96 | HR 73 | Ht 66.0 in | Wt 203.8 lb

## 2023-05-11 DIAGNOSIS — Z3042 Encounter for surveillance of injectable contraceptive: Secondary | ICD-10-CM

## 2023-05-11 MED ORDER — MEDROXYPROGESTERONE ACETATE 150 MG/ML IM SUSY
150.0000 mg | PREFILLED_SYRINGE | Freq: Once | INTRAMUSCULAR | Status: AC
  Administered 2023-05-11: 150 mg via INTRAMUSCULAR

## 2023-05-11 NOTE — Progress Notes (Signed)
 NURSE VISIT- INJECTION  SUBJECTIVE:  Megan Schneider is a 33 y.o. G20P2002 female here for a Depo Provera for contraception/period management. She is a GYN patient.   OBJECTIVE:  BP (!) 147/96 Comment: PCP has switched HTN medication; pt will monitor at home  Pulse 73   Ht 5\' 6"  (1.676 m)   Wt 203 lb 12.8 oz (92.4 kg)   BMI 32.89 kg/m   Appears well, in no apparent distress  Injection administered in: Right upper quad. gluteus  Meds ordered this encounter  Medications   medroxyPROGESTERone Acetate SUSY 150 mg    ASSESSMENT: GYN patient Depo Provera for contraception/period management PLAN: Follow-up: in 11-13 weeks for next Depo

## 2023-05-22 ENCOUNTER — Encounter (HOSPITAL_BASED_OUTPATIENT_CLINIC_OR_DEPARTMENT_OTHER): Payer: Medicaid Other | Admitting: Family Medicine

## 2023-05-29 ENCOUNTER — Ambulatory Visit (HOSPITAL_BASED_OUTPATIENT_CLINIC_OR_DEPARTMENT_OTHER): Payer: Medicaid Other | Admitting: Family Medicine

## 2023-05-31 ENCOUNTER — Encounter (HOSPITAL_BASED_OUTPATIENT_CLINIC_OR_DEPARTMENT_OTHER): Payer: Self-pay | Admitting: Family Medicine

## 2023-06-29 ENCOUNTER — Encounter (HOSPITAL_BASED_OUTPATIENT_CLINIC_OR_DEPARTMENT_OTHER): Admitting: Family Medicine

## 2023-07-27 ENCOUNTER — Ambulatory Visit (HOSPITAL_BASED_OUTPATIENT_CLINIC_OR_DEPARTMENT_OTHER)

## 2023-07-27 ENCOUNTER — Encounter (HOSPITAL_BASED_OUTPATIENT_CLINIC_OR_DEPARTMENT_OTHER): Payer: Self-pay

## 2023-07-27 VITALS — BP 155/101 | HR 67 | Wt 206.4 lb

## 2023-07-27 DIAGNOSIS — Z3042 Encounter for surveillance of injectable contraceptive: Secondary | ICD-10-CM

## 2023-07-28 MED ORDER — MEDROXYPROGESTERONE ACETATE 150 MG/ML IM SUSY
150.0000 mg | PREFILLED_SYRINGE | Freq: Once | INTRAMUSCULAR | Status: AC
  Administered 2023-07-27: 150 mg via INTRAMUSCULAR

## 2023-07-28 NOTE — Progress Notes (Addendum)
 NURSE VISIT- INJECTION  SUBJECTIVE:  Megan Schneider is a 33 y.o. G78P2002 female here for a Depo Provera  for contraception/period management. She is a GYN patient.   OBJECTIVE:  BP (!) 155/101 Comment: Left arm  Pulse 67   Wt 206 lb 6.4 oz (93.6 kg)   BMI 33.31 kg/m   Appears well, in no apparent distress  Injection administered in: Right upper quad. gluteus  Meds ordered this encounter  Medications   medroxyPROGESTERone  Acetate SUSY 150 mg    ASSESSMENT: GYN patient Depo Provera  contraception PLAN: Follow-up: in 11-13 weeks for next Depo    Blood pressure checked manually by provider and was 132/82.  Pt denied headache or any visual changes.  MSM

## 2023-08-04 ENCOUNTER — Telehealth: Admitting: Physician Assistant

## 2023-08-04 DIAGNOSIS — K649 Unspecified hemorrhoids: Secondary | ICD-10-CM | POA: Diagnosis not present

## 2023-08-04 MED ORDER — LIDOCAINE (ANORECTAL) 5 % EX GEL
CUTANEOUS | 0 refills | Status: DC
Start: 2023-08-04 — End: 2023-09-14

## 2023-08-04 MED ORDER — HYDROCORTISONE ACETATE 25 MG RE SUPP: 25.0000 mg | Freq: Two times a day (BID) | RECTAL | 0 refills | Status: DC

## 2023-08-04 NOTE — Progress Notes (Signed)

## 2023-08-07 ENCOUNTER — Encounter (HOSPITAL_BASED_OUTPATIENT_CLINIC_OR_DEPARTMENT_OTHER): Payer: Self-pay | Admitting: Family Medicine

## 2023-09-14 ENCOUNTER — Other Ambulatory Visit (HOSPITAL_BASED_OUTPATIENT_CLINIC_OR_DEPARTMENT_OTHER): Payer: Self-pay

## 2023-09-14 ENCOUNTER — Encounter (HOSPITAL_BASED_OUTPATIENT_CLINIC_OR_DEPARTMENT_OTHER): Payer: Self-pay | Admitting: Obstetrics & Gynecology

## 2023-09-14 ENCOUNTER — Ambulatory Visit (HOSPITAL_BASED_OUTPATIENT_CLINIC_OR_DEPARTMENT_OTHER): Admitting: Obstetrics & Gynecology

## 2023-09-14 VITALS — BP 150/100 | HR 69 | Wt 206.0 lb

## 2023-09-14 DIAGNOSIS — E282 Polycystic ovarian syndrome: Secondary | ICD-10-CM | POA: Diagnosis not present

## 2023-09-14 DIAGNOSIS — Z01419 Encounter for gynecological examination (general) (routine) without abnormal findings: Secondary | ICD-10-CM | POA: Diagnosis not present

## 2023-09-14 DIAGNOSIS — Z30013 Encounter for initial prescription of injectable contraceptive: Secondary | ICD-10-CM | POA: Diagnosis not present

## 2023-09-14 DIAGNOSIS — I10 Essential (primary) hypertension: Secondary | ICD-10-CM

## 2023-09-14 MED ORDER — OLMESARTAN MEDOXOMIL 20 MG PO TABS
20.0000 mg | ORAL_TABLET | Freq: Every day | ORAL | 0 refills | Status: DC
  Filled 2023-09-14: qty 30, 30d supply, fill #0

## 2023-09-14 NOTE — Progress Notes (Signed)
 ANNUAL EXAM Patient name: Megan Schneider MRN [id number]  Date of birth: 02-15-1991 Chief Complaint:   Gynecologic Exam  History of Present Illness:   Megan Schneider is a 33 y.o. G42P2002 African-American female being seen today for a routine annual exam.  Does not have any vaginal bleeding.    No LMP recorded. Patient has had an injection.   Last pap 05/19/2022. Results were: NILM w/ HRHPV negative. H/O abnormal pap: no Last mammogram: guidelines reviewed.  Last colonoscopy: guidelines reviewed.       09/14/2023    2:06 PM 03/28/2023   10:36 AM 01/05/2023    4:19 PM 11/08/2022    4:21 PM 06/15/2022    4:09 PM  Depression screen PHQ 2/9  Decreased Interest 0 0 0 1 0  Down, Depressed, Hopeless 0 0 0 1 0  PHQ - 2 Score 0 0 0 2 0  Altered sleeping  0 2 3   Tired, decreased energy  0 1 1   Change in appetite  0 1 1   Feeling bad or failure about yourself   0 0 0   Trouble concentrating  0 0 0   Moving slowly or fidgety/restless  0 0 0   Suicidal thoughts  0 0 0   PHQ-9 Score  0 4 7   Difficult doing work/chores  Not difficult at all Not difficult at all Not difficult at all     Review of Systems:   Pertinent items are noted in HPI Denies any bowel or bladder changes.  Denies pelvic pain except when near menstrual cycle.   Pertinent History Reviewed:  Reviewed past medical,surgical, social and family history.  Reviewed problem list, medications and allergies. Physical Assessment:   Vitals:   09/14/23 1348 09/14/23 1403  BP: (!) 148/104 (!) 150/100  Pulse: 69   SpO2: 99%   Weight: 206 lb (93.4 kg)   Body mass index is 33.25 kg/m.        Physical Examination:   General appearance - well appearing, and in no distress  Mental status - alert, oriented to person, place, and time  Psych:  She has a normal mood and affect  Skin - warm and dry, normal color, no suspicious lesions noted  Chest - effort normal, all lung fields clear to auscultation bilaterally  Heart -  normal rate and regular rhythm  Neck:  midline trachea, no thyromegaly or nodules  Breasts - breasts appear normal, no suspicious masses, no skin or nipple changes or  axillary nodes  Abdomen - soft, nontender, nondistended, no masses or organomegaly  Pelvic - VULVA: normal appearing vulva with no masses, tenderness or lesions   VAGINA: normal appearing vagina with normal color and discharge, no lesions   CERVIX: normal appearing cervix without discharge or lesions, no CMT  Thin prep pap is not indicated  UTERUS: uterus is felt to be normal size, shape, consistency and nontender   ADNEXA: No adnexal masses or tenderness noted.  Rectal - deferred  Extremities:  No swelling or varicosities noted  Chaperone present for exam  No results found for this or any previous visit (from the past 24 hours).  Assessment & Plan:  1. Well woman exam with routine gynecological exam (Primary) - Pap smear neg with neg HR HPV 05/2022.  Not indicated today. - Mammogram guidelines reviewed - Colonoscopy guidelines reviewed - lab work done with PCP, Thersia Stark - vaccines reviewed/updated.  She is going to get me her Hep B vaccine dates  2. Encounter for initial prescription of injectable contraceptive - pt will return for Depo provera  injections every 12 months for the next year  3. PCOS (polycystic ovarian syndrome)  4. Primary hypertension - has f/u with Thersia Stark next week.  She recommended increasing Olmesartan  to 20mg .  New rx written for pt.    No orders of the defined types were placed in this encounter.   Meds: No orders of the defined types were placed in this encounter.   Follow-up: Return in about 1 year (around 09/13/2024).  Ronal GORMAN Pinal, MD 09/14/2023 2:14 PM

## 2023-09-16 ENCOUNTER — Encounter (HOSPITAL_COMMUNITY): Payer: Self-pay

## 2023-09-16 ENCOUNTER — Emergency Department (HOSPITAL_COMMUNITY)
Admission: EM | Admit: 2023-09-16 | Discharge: 2023-09-16 | Disposition: A | Discharge status: Home / Self Care | Attending: Emergency Medicine | Admitting: Emergency Medicine

## 2023-09-16 ENCOUNTER — Emergency Department (HOSPITAL_COMMUNITY)

## 2023-09-16 ENCOUNTER — Other Ambulatory Visit: Payer: Self-pay

## 2023-09-16 DIAGNOSIS — S93402A Sprain of unspecified ligament of left ankle, initial encounter: Secondary | ICD-10-CM | POA: Insufficient documentation

## 2023-09-16 DIAGNOSIS — S99912A Unspecified injury of left ankle, initial encounter: Secondary | ICD-10-CM | POA: Diagnosis present

## 2023-09-16 DIAGNOSIS — X501XXA Overexertion from prolonged static or awkward postures, initial encounter: Secondary | ICD-10-CM | POA: Insufficient documentation

## 2023-09-16 MED ORDER — IBUPROFEN 400 MG PO TABS
400.0000 mg | ORAL_TABLET | Freq: Once | ORAL | Status: AC | PRN
  Administered 2023-09-16: 400 mg via ORAL

## 2023-09-16 MED ORDER — IBUPROFEN 400 MG PO TABS
ORAL_TABLET | ORAL | Status: AC
  Filled 2023-09-16: qty 1

## 2023-09-16 NOTE — ED Triage Notes (Signed)
 Pt c.o left ankle and foot pain after twisting it yesterday trying to avoid stepping on her neighbors cat. Pt took tylenol  last night and excedrin this morning for the pain. No swelling noted

## 2023-09-16 NOTE — ED Provider Notes (Signed)
 [place] EMERGENCY DEPARTMENT AT [facility] Provider Note   CSN: [id number] Arrival date & time: 09/16/23  9342     Patient presents with: Ankle Injury   Megan Schneider is a 33 y.o. female.    Ankle Injury  Patient presents for left ankle pain.  She had a twisting movement injury yesterday when she shifted to avoid stepping on neighbor's cat.  She has since had pain in bilateral areas of ankle, more prominent on the medial aspect in addition to her left hindfoot.  She took Tylenol  Excedrin at home last night.  Pain persisted this morning was prompted to come to the ED. pain is worsened with weightbearing and palpation.  She denies any other areas of pain or suspected injury.     Prior to Admission medications   Medication Sig Start Date End Date Taking? Authorizing Provider  albuterol  (VENTOLIN  HFA) 108 (90 Base) MCG/ACT inhaler Inhale 1-2 puffs into the lungs every 4 (four) hours as needed for wheezing. 03/01/21   Oris Camie BRAVO, NP  Blood Pressure KIT Z34.90 To monitored regularly at home Large 07/05/18   Nicholaus Burnard HERO, MD  chlorthalidone  (HYGROTON ) 25 MG tablet Take 1 tablet (25 mg total) by mouth daily. 01/10/23   Towana Small, FNP  ibuprofen  (ADVIL ) 600 MG tablet Take 600 mg by mouth every 6 (six) hours as needed. 10/20/22   [provider]  naproxen  sodium (ANAPROX ) 275 MG tablet Take 2 tablets (550 mg total) by mouth 2 (two) times daily with a meal. 07/06/22   Towana Small, FNP  olmesartan  (BENICAR ) 20 MG tablet Take 1 tablet (20 mg total) by mouth daily. 09/14/23   Cleotilde Ronal RAMAN, MD    Allergies: Patient has no known allergies.    Review of Systems  Musculoskeletal:  Positive for arthralgias.  All other systems reviewed and are negative.   Updated Vital Signs BP (!) 158/107   Pulse 83   Temp 97.9 F (36.6 C) (Oral)   Resp 16   SpO2 99%   Physical Exam Vitals and nursing note reviewed.  Constitutional:      General: She is not in  acute distress.    Appearance: Normal appearance. She is well-developed. She is not ill-appearing, toxic-appearing or diaphoretic.  HENT:     Head: Normocephalic and atraumatic.     Right Ear: External ear normal.     Left Ear: External ear normal.     Nose: Nose normal.     Mouth/Throat:     Mouth: Mucous membranes are moist.  Eyes:     Extraocular Movements: Extraocular movements intact.     Conjunctiva/sclera: Conjunctivae normal.  Cardiovascular:     Rate and Rhythm: Normal rate and regular rhythm.  Pulmonary:     Effort: Pulmonary effort is normal. No respiratory distress.  Abdominal:     General: There is no distension.     Palpations: Abdomen is soft.  Musculoskeletal:        General: Tenderness and signs of injury present. No swelling or deformity.     Cervical back: Normal range of motion and neck supple.  Skin:    General: Skin is warm and dry.     Coloration: Skin is not jaundiced or pale.  Neurological:     General: No focal deficit present.     Mental Status: She is alert and oriented to person, place, and time.  Psychiatric:        Mood and Affect: Mood normal.  Behavior: Behavior normal.     (all labs ordered are listed, but only abnormal results are displayed) Labs Reviewed - No data to display  EKG: None  Radiology: DG Foot Complete Left Result Date: 09/16/2023 CLINICAL DATA:  33 year old female status post twisting injury yesterday with pain. EXAM: LEFT FOOT - COMPLETE 3+ VIEW COMPARISON:  Left ankle series today reported separately. Previous left foot series 10/19/2022. FINDINGS: Three views. Bone mineralization is within normal limits. There is no evidence of fracture or dislocation. There is no evidence of arthropathy or other focal bone abnormality. No discrete soft tissue injury. IMPRESSION: Negative. Electronically Signed   By: VEAR Hurst M.D.   On: 09/16/2023 07:59   DG Ankle Complete Left Result Date: 09/16/2023 EXAM: 3 or more VIEW(S) XRAY OF  THE LEFT ANKLE 09/16/2023 07:30:00 AM CLINICAL HISTORY: Pt c.o left ankle and foot pain after twisting it yesterday trying to avoid stepping on her neighbors cat. COMPARISON: None available. FINDINGS: BONES AND JOINTS: No acute fracture. No focal osseous lesion. No joint dislocation. SOFT TISSUES: The soft tissues are unremarkable. IMPRESSION: 1. No acute osseous abnormality. Electronically signed by: Waddell Calk MD 09/16/2023 07:58 AM EDT RP Workstation: [id number]     Procedures   Medications Ordered in the ED  ibuprofen  (ADVIL ) tablet 400 mg (400 mg Oral Given 09/16/23 0716)                                    Medical Decision Making Amount and/or Complexity of Data Reviewed Radiology: ordered.  Risk Prescription drug management.   Patient presenting for left ankle injury.  This occurred yesterday.  She describes a twisting motion of her ankle, she moved to avoid stepping on the cat.  On arrival in the ED, she is overall well-appearing.  No appreciable swelling is noted to her left ankle.  She does have tenderness on bilateral aspects, greater on the medial side.  X-ray imaging does not show any acute osseous abnormalities.  Ace bandage was applied.  Patient was counseled on RICE therapy and over-the-counter medications as needed for pain.  She was discharged in stable condition.     Final diagnoses:  Sprain of left ankle, unspecified ligament, initial encounter    ED Discharge Orders     None          Melvenia Motto, MD 09/16/23 [phone]

## 2023-09-16 NOTE — Discharge Instructions (Addendum)
 Take ibuprofen  and Tylenol  as needed for pain.  Rest, ice, compress, and elevate ankle when possible.  Return to the emergency department for any new or worsening symptoms of concern.

## 2023-09-20 ENCOUNTER — Encounter (HOSPITAL_BASED_OUTPATIENT_CLINIC_OR_DEPARTMENT_OTHER): Payer: Self-pay | Admitting: Family Medicine

## 2023-09-20 ENCOUNTER — Ambulatory Visit (INDEPENDENT_AMBULATORY_CARE_PROVIDER_SITE_OTHER): Admitting: Family Medicine

## 2023-09-20 ENCOUNTER — Other Ambulatory Visit (HOSPITAL_BASED_OUTPATIENT_CLINIC_OR_DEPARTMENT_OTHER): Payer: Self-pay | Admitting: Family Medicine

## 2023-09-20 VITALS — BP 130/90 | HR 81 | Ht 66.0 in | Wt 202.8 lb

## 2023-09-20 DIAGNOSIS — I10 Essential (primary) hypertension: Secondary | ICD-10-CM

## 2023-09-20 DIAGNOSIS — Z0000 Encounter for general adult medical examination without abnormal findings: Secondary | ICD-10-CM

## 2023-09-20 DIAGNOSIS — M79642 Pain in left hand: Secondary | ICD-10-CM

## 2023-09-20 DIAGNOSIS — Z13220 Encounter for screening for lipoid disorders: Secondary | ICD-10-CM | POA: Diagnosis not present

## 2023-09-20 DIAGNOSIS — E66811 Obesity, class 1: Secondary | ICD-10-CM | POA: Diagnosis not present

## 2023-09-20 DIAGNOSIS — R102 Pelvic and perineal pain: Secondary | ICD-10-CM

## 2023-09-20 DIAGNOSIS — M79641 Pain in right hand: Secondary | ICD-10-CM | POA: Insufficient documentation

## 2023-09-20 NOTE — Progress Notes (Signed)
 Subjective:   Megan Schneider 1990-08-14  09/20/2023   CC: Chief Complaint  Patient presents with   Annual Exam    Patient is here today for her physical. Wants to discuss her BP, weight, and pain in hands.    HPI: Megan Schneider is a 33 y.o. female who presents for a routine health maintenance exam.  Labs collected at time of visit.   HYPERTENSION: Megan Schneider presents for the medical management of hypertension.  Patient's current hypertension medication regimen is: olmesartan  20mg  daily  Patient is currently taking prescribed medications for HTN.  Patient is regularly keeping a check on BP at home (this morning BP check was 128/83).  Adhering to low sodium diet: yes; consumes 90oz daily  Exercising Regularly: no Denies headache, dizziness, CP, SHOB, vision changes.    BP Readings from Last 3 Encounters:  09/20/23 (!) 130/90  09/16/23 (!) 158/107  09/14/23 (!) 150/100   WEIGHT MANAGEMENT:  Pt states she is interested in starting a GLP-1 for weight loss, as she has been trying to lose weight and wants some help. Pt's diet consists of eating one meal per day, usually dinner. She states she does not have a regular exercise routine, as she works full time and has two young children at home. Her last A1c was 5.4% in 2023.    PELVIC PAIN:  Pt reports hx of PCOS, states she intermittently has pain in her pelvic area. She uses ibuprofen  and heat, and that usually helps. She has done pelvic floor PT in the past.    HAND PAIN:  Pt reports intermittent bilateral hand pain for several months. Denies trauma or injury. Pain will begin spontaneously, and lasts for 1 week before resolving. She takes ibuprofen  with some relief.    HEALTH SCREENINGS: - Vision Screening: up to date - Dental Visits: up to date - Pap smear: up to date - Breast Exam: up to date - STD Screening: Declined - Mammogram (40+): Not applicable  - Colonoscopy (45+): Not applicable  - Bone Density  (65+ or under 65 with predisposing conditions): Not applicable  - Lung CA screening with low-dose CT:  Not applicable Adults age 29-80 who are current cigarette smokers or quit within the last 15 years. Must have 20 pack year history.    Depression and Anxiety Screen done today and results listed below:     09/20/2023    2:58 PM 09/14/2023    2:06 PM 03/28/2023   10:36 AM 01/05/2023    4:19 PM 11/08/2022    4:21 PM  Depression screen PHQ 2/9  Decreased Interest 0 0 0 0 1  Down, Depressed, Hopeless 0 0 0 0 1  PHQ - 2 Score 0 0 0 0 2  Altered sleeping 3  0 2 3  Tired, decreased energy 1  0 1 1  Change in appetite 3  0 1 1  Feeling bad or failure about yourself  0  0 0 0  Trouble concentrating 0  0 0 0  Moving slowly or fidgety/restless 0  0 0 0  Suicidal thoughts 0  0 0 0  PHQ-9 Score 7  0 4 7  Difficult doing work/chores Not difficult at all  Not difficult at all Not difficult at all Not difficult at all      09/20/2023    2:59 PM 03/28/2023   10:36 AM 01/05/2023    4:20 PM 11/08/2022    4:21 PM  GAD 7 : Generalized Anxiety Score  Nervous,  Anxious, on Edge 0 0 0 1  Control/stop worrying 0 0 0 1  Worry too much - different things 0 0 0 1  Trouble relaxing 0 0 0 1  Restless 0 0 0 0  Easily annoyed or irritable 0 0 0 1  Afraid - awful might happen 0 0 0 0  Total GAD 7 Score 0 0 0 5  Anxiety Difficulty Not difficult at all Not difficult at all Not difficult at all Not difficult at all    IMMUNIZATIONS: - Tdap: Tetanus vaccination status reviewed: last tetanus booster within 10 years. - HPV: Up to date - Influenza: Postponed to flu season - Pneumovax: Not applicable - Prevnar 20: Not applicable - Shingrix (50+): Not applicable   Past medical history, surgical history, medications, allergies, family history and social history reviewed with patient today and changes made to appropriate areas of the chart.   Past Medical History:  Diagnosis Date   Anxiety    Asthma     occasionally, exercise-induced; uses inhaler; not used in a while   Cannabis hyperemesis syndrome concurrent with and due to cannabis abuse (HCC) 08/02/2018   Gestational hypertension 01/25/2019   Hemorrhoids during puerperium with postpartum complication 01/31/2019   Hypertension    started as gestational   Marijuana use 08/24/2016   Positive tox screen in Jan 2018   Scoliosis     Past Surgical History:  Procedure Laterality Date   NO PAST SURGERIES      Current Outpatient Medications on File Prior to Visit  Medication Sig   albuterol  (VENTOLIN  HFA) 108 (90 Base) MCG/ACT inhaler Inhale 1-2 puffs into the lungs every 4 (four) hours as needed for wheezing.   Blood Pressure KIT Z34.90 To monitored regularly at home Large   chlorthalidone  (HYGROTON ) 25 MG tablet Take 1 tablet (25 mg total) by mouth daily.   ibuprofen  (ADVIL ) 600 MG tablet Take 600 mg by mouth every 6 (six) hours as needed.   naproxen  sodium (ANAPROX ) 275 MG tablet Take 2 tablets (550 mg total) by mouth 2 (two) times daily with a meal.   olmesartan  (BENICAR ) 20 MG tablet Take 1 tablet (20 mg total) by mouth daily.   No current facility-administered medications on file prior to visit.    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  Tobacco Use   Smoking status: Never    Passive exposure: Never   Smokeless tobacco: Never  Vaping Use   Vaping status: Never Used  Substance and Sexual Activity   Alcohol use: No    Comment: occasionally   Drug use: Not Currently    Types: Marijuana    Comment: reported last time was when [redacted] weeks pregnant   Sexual activity: Not Currently    Birth control/protection: Injection, None  Other Topics Concern   Not on file   Social History Narrative   Not on file   Social Drivers of Health   Financial Resource Strain: High Risk (09/20/2023)   Overall Financial Resource Strain (CARDIA)    Difficulty of Paying Living Expenses: Hard  Food Insecurity: Food Insecurity Present (09/20/2023)   Hunger Vital Sign    Worried About Running Out of Food in the  Last Year: Sometimes true    Ran Out of Food in the Last Year: Sometimes true  Transportation Needs: No Transportation Needs (09/20/2023)   PRAPARE - Administrator, Civil Service (Medical): No    Lack of Transportation (Non-Medical): No  Physical Activity: Inactive (09/20/2023)   Exercise Vital Sign    Days of Exercise per Week: 0 days    Minutes of Exercise per Session: Not on file  Stress: Stress Concern Present (09/20/2023)   [employer] of Occupational Health - Occupational Stress Questionnaire    Feeling of Stress: To some extent  Social Connections: Socially Isolated (09/20/2023)   Social Connection and Isolation Panel    Frequency of Communication with Friends and Family: Three times a week    Frequency of Social Gatherings with Friends and Family: Never    Attends Religious Services: Never    Database administrator or Organizations: No    Attends Engineer, structural: Not on file    Marital Status: Never married  Intimate Partner Violence: Not At Risk (03/01/2021)   Humiliation, Afraid, Rape, and Kick questionnaire    Fear of Current or Ex-Partner: No    Emotionally Abused: No    Physically Abused: No    Sexually Abused: No   Social History   Tobacco Use  Smoking Status Never   Passive exposure: Never  Smokeless Tobacco Never   Social History   Substance and Sexual Activity  Alcohol Use No   Comment: occasionally    Family History  Problem Relation Age of Onset   Diabetes Father    Early death Father      ROS: Denies fever, fatigue, unexplained weight loss/gain, chest pain, SHOB, and palpitations. Denies  neurological deficits, gastrointestinal or genitourinary complaints, and skin changes.   Objective:   Today's Vitals   09/20/23 1455 09/20/23 1534  BP: (!) 140/98 (!) 130/90  Pulse: 81   SpO2: 100%   Weight: 92 kg   Height: 5' 6 (1.676 m)     GENERAL APPEARANCE: Well-appearing, in NAD. Well nourished.  SKIN: Pink, warm and dry. Turgor normal. No rash, lesion, ulceration, or ecchymoses. Hair evenly distributed.  HEENT: HEAD: Normocephalic.  EYES: PERRLA. EOMI. Lids intact w/o defect. Sclera white, Conjunctiva pink w/o exudate.  EARS: External ear w/o redness, swelling, masses or lesions. EAC clear. TM's intact, translucent w/o bulging, appropriate landmarks visualized. Appropriate acuity to conversational tones.  NOSE: Septum midline w/o deformity. Nares patent, mucosa pink and non-inflamed w/o drainage. No sinus tenderness.  THROAT: Uvula midline. Oropharynx clear. Tonsils non-inflamed w/o exudate. Oral mucosa pink and moist.  NECK: Supple, Trachea midline. Full ROM w/o pain or tenderness. No lymphadenopathy. Thyroid non-tender w/o enlargement or palpable masses.  RESPIRATORY: Chest wall symmetrical w/o masses. Respirations even and non-labored. Breath sounds clear to auscultation bilaterally. No wheezes, rales, rhonchi, or crackles. CARDIAC: S1, S2 present, regular rate and rhythm. No gallops, murmurs, rubs, or clicks. PMI w/o lifts, heaves, or thrills. No carotid bruits. Capillary refill <2 seconds. Peripheral pulses 2+ bilaterally. GI: Abdomen soft w/o distention. Normoactive bowel sounds. No palpable masses or tenderness. No guarding or rebound tenderness. Liver and spleen w/o tenderness or enlargement. No CVA tenderness.  MSK: Muscle tone and strength appropriate for age, w/o atrophy or abnormal movement.  EXTREMITIES: Active ROM intact, w/o tenderness, crepitus, or contracture. No obvious joint deformities or effusions. Ace wrap to left ankle.  NEUROLOGIC: CN's II-XII intact. Motor  strength symmetrical with no obvious weakness. No sensory  deficits. DTR's 2+ symmetric bilaterally. Steady, even gait.  PSYCH/MENTAL STATUS: Alert, oriented x 3. Cooperative, appropriate mood and affect.   Results for orders placed or performed in visit on 03/28/23  POCT Influenza A/B   Collection Time: 03/28/23 11:02 AM  Result Value Ref Range   Influenza A, POC Negative Negative   Influenza B, POC Negative Negative  POCT COVID BINAX NOW CARD   Collection Time: 03/28/23 11:02 AM  Result Value Ref Range   SARS Coronavirus 2 Ag Negative Negative    Assessment & Plan:  1. Annual physical exam (Primary) Discussed preventative screenings, vaccines, and healthy lifestyle with patient.  - CBC with Differential/Platelet - Hemoglobin A1c - TSH - Comprehensive metabolic panel with GFR  2. Screening for lipid disorders - Lipid panel  3. Primary hypertension Controlled. Repeat BP improved during office visit. Pt will RTO in 4wks for nurse BP recheck.   4. Obesity, Class I, BMI 30-34.9 Pt is encouraged to consume a well-balanced diet, low carb/high fiber/high protein. Also discussed increasing the number of meals throughout the day, for which pt agreed. Discussed incorporating strength training exercise 2-3x weekly as well to aid in weight loss.   5. Bilateral hand pain Pt encouraged to continue ibuprofen  during episodes of hand pain. Labs collected today to r/o autoimmune root cause.  - Sed Rate (ESR) - VITAMIN D  25 Hydroxy (Vit-D Deficiency, Fractures) - ANA  6. Pelvic pain Pt encouraged to try magnesium glycinate for pelvic pain; okay to continue ibuprofen  and heat therapy. Offered Rx cyclobenzaprine , however pt declined, stating she has taken that in the past and did not like the side effects. Discussed the benefits of pelvic floor PT and PCOS; pt states she still has the exercise bands at home from previous sessions and will restart exercises on her own.    Orders Placed This  Encounter  Procedures   Sed Rate (ESR)   VITAMIN D  25 Hydroxy (Vit-D Deficiency, Fractures)   CBC with Differential/Platelet   Hemoglobin A1c   TSH   Lipid panel   Comprehensive metabolic panel with GFR   ANA    PATIENT COUNSELING:  - Encouraged a healthy well-balanced diet. Patient may adjust caloric intake to maintain or achieve ideal body weight. May reduce intake of dietary saturated fat and total fat and have adequate dietary potassium and calcium preferably from fresh fruits, vegetables, and low-fat dairy products.   - Advised to avoid cigarette smoking. - Discussed with the patient that most people either abstain from alcohol or drink within safe limits (<=14/week and <=4 drinks/occasion for males, <=7/weeks and <= 3 drinks/occasion for females) and that the risk for alcohol disorders and other health effects rises proportionally with the number of drinks per week and how often a drinker exceeds daily limits. - Discussed cessation/primary prevention of drug use and availability of treatment for abuse.  - Discussed sexually transmitted diseases, avoidance of unintended pregnancy and contraceptive alternatives.  - Stressed the importance of regular exercise - Injury prevention: Discussed safety belts, safety helmets, smoke detector, smoking near bedding or upholstery.  - Dental health: Discussed importance of regular tooth brushing, flossing, and dental visits.   NEXT PREVENTATIVE PHYSICAL DUE IN 1 YEAR.  Return in about 4 months (around 01/20/2024) for 4 weeks for nurse BP recheck; 4 months for weight & Hypertension.  Patient to reach out to office if new, worrisome, or unresolved symptoms arise or if no improvement in patient's condition. Patient verbalized understanding and is agreeable to treatment plan.  All questions answered to patient's satisfaction.   Treatment plan and recommendation(s) reviewed by supervising preceptor, Thersia CLEMENTEEN Stark, FNP-C, prior to clinic discharge.    Rosina Ada, BSN, RN  DNP Student

## 2023-09-20 NOTE — Progress Notes (Deleted)
 Subjective:   Megan Schneider 29-Jun-1990  09/20/2023   CC: Chief Complaint  Patient presents with   Annual Exam    Patient is here today for her physical. Wants to discuss her BP, weight, and pain in hands.    HPI: Megan Schneider is a 33 y.o. female who presents for a routine health maintenance exam.  Labs collected at time of visit.    HEALTH SCREENINGS: - Vision Screening: {Blank single:19197::pap done,not applicable,up to date,done elsewhere} - Dental Visits: {Blank single:19197::pap done,not applicable,up to date,done elsewhere} - Pap smear: {Blank single:19197::pap done,not applicable,up to date,done elsewhere} - Breast Exam: {Blank single:19197::pap done,not applicable,up to date,done elsewhere} - STD Screening: {Blank single:19197::Up to date,Ordered today,Not applicable,Declined,Done elsewhere} - Mammogram (40+): {Blank single:19197::Up to date,Ordered today,Not applicable,Refused,Done elsewhere}  - Colonoscopy (45+): {Blank single:19197::Up to date,Ordered today,Not applicable,Refused,Done elsewhere}  - Bone Density (65+ or under 65 with predisposing conditions): {Blank single:19197::Up to date,Ordered today,Not applicable,Refused,Done elsewhere}  - Lung CA screening with low-dose CT:  {Blank single:19197::Up to date,Ordered today,Not applicable,Declined,Done elsewhere} Adults age 58-80 who are current cigarette smokers or quit within the last 15 years. Must have 20 pack year history.   Depression and Anxiety Screen done today and results listed below:     09/20/2023    2:58 PM 09/14/2023    2:06 PM 03/28/2023   10:36 AM 01/05/2023    4:19 PM 11/08/2022    4:21 PM  Depression screen PHQ 2/9  Decreased Interest 0 0 0 0 1  Down, Depressed, Hopeless 0 0 0 0 1  PHQ - 2 Score 0 0 0 0 2  Altered sleeping 3  0 2 3  Tired, decreased energy 1  0 1 1  Change in appetite 3  0 1 1  Feeling  bad or failure about yourself  0  0 0 0  Trouble concentrating 0  0 0 0  Moving slowly or fidgety/restless 0  0 0 0  Suicidal thoughts 0  0 0 0  PHQ-9 Score 7  0 4 7  Difficult doing work/chores Not difficult at all  Not difficult at all Not difficult at all Not difficult at all      09/20/2023    2:59 PM 03/28/2023   10:36 AM 01/05/2023    4:20 PM 11/08/2022    4:21 PM  GAD 7 : Generalized Anxiety Score  Nervous, Anxious, on Edge 0 0 0 1  Control/stop worrying 0 0 0 1  Worry too much - different things 0 0 0 1  Trouble relaxing 0 0 0 1  Restless 0 0 0 0  Easily annoyed or irritable 0 0 0 1  Afraid - awful might happen 0 0 0 0  Total GAD 7 Score 0 0 0 5  Anxiety Difficulty Not difficult at all Not difficult at all Not difficult at all Not difficult at all    IMMUNIZATIONS: - Tdap: Tetanus vaccination status reviewed: {tetanus status:315746}. - HPV: {Blank single:19197::Up to date,Administered today,Not applicable,Refused,Given elsewhere} - Influenza: {Blank single:19197::Up to date,Administered today,Postponed to flu season,Refused,Given elsewhere} - Pneumovax: {Blank single:19197::Up to date,Administered today,Not applicable,Refused,Given elsewhere} - Prevnar 20: {Blank single:19197::Up to date,Administered today,Not applicable,Refused,Given elsewhere} - Shingrix (50+): {Blank single:19197::Up to date,Administered today,Not applicable,Refused,Given elsewhere}   Past medical history, surgical history, medications, allergies, family history and social history reviewed with patient today and changes made to appropriate areas of the chart.   Past Medical History:  Diagnosis Date   Anxiety    Asthma    occasionally,  exercise-induced; uses inhaler; not used in a while   Cannabis hyperemesis syndrome concurrent with and due to cannabis abuse (HCC) 08/02/2018   Gestational hypertension 01/25/2019   Hemorrhoids during puerperium with  postpartum complication 01/31/2019   Hypertension    started as gestational   Marijuana use 08/24/2016   Positive tox screen in Jan 2018   Scoliosis     Past Surgical History:  Procedure Laterality Date   NO PAST SURGERIES      Current Outpatient Medications on File Prior to Visit  Medication Sig   albuterol  (VENTOLIN  HFA) 108 (90 Base) MCG/ACT inhaler Inhale 1-2 puffs into the lungs every 4 (four) hours as needed for wheezing.   Blood Pressure KIT Z34.90 To monitored regularly at home Large   chlorthalidone  (HYGROTON ) 25 MG tablet Take 1 tablet (25 mg total) by mouth daily.   ibuprofen  (ADVIL ) 600 MG tablet Take 600 mg by mouth every 6 (six) hours as needed.   naproxen  sodium (ANAPROX ) 275 MG tablet Take 2 tablets (550 mg total) by mouth 2 (two) times daily with a meal.   olmesartan  (BENICAR ) 20 MG tablet Take 1 tablet (20 mg total) by mouth daily.   No current facility-administered medications on file prior to visit.    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  Tobacco Use   Smoking status: Never    Passive exposure: Never   Smokeless tobacco: Never  Vaping Use   Vaping status: Never Used  Substance and Sexual Activity   Alcohol use: No    Comment: occasionally   Drug use: Not Currently    Types: Marijuana    Comment: reported last time was when [redacted] weeks pregnant   Sexual activity: Not Currently    Birth control/protection: Injection, None  Other Topics Concern   Not on file  Social History Narrative   Not on file   Social Drivers of Health   Financial Resource Strain: High Risk (09/20/2023)   Overall Financial Resource Strain (CARDIA)    Difficulty of Paying Living Expenses: Hard  Food Insecurity:  Food Insecurity Present (09/20/2023)   Hunger Vital Sign    Worried About Running Out of Food in the Last Year: Sometimes true    Ran Out of Food in the Last Year: Sometimes true  Transportation Needs: No Transportation Needs (09/20/2023)   PRAPARE - Administrator, Civil Service (Medical): No    Lack of Transportation (Non-Medical): No  Physical Activity: Inactive (09/20/2023)   Exercise Vital Sign    Days of Exercise per Week: 0 days    Minutes of Exercise per Session: Not on file  Stress: Stress Concern Present (09/20/2023)   [employer] of Occupational Health - Occupational Stress Questionnaire    Feeling of Stress: To some extent  Social Connections: Socially Isolated (09/20/2023)   Social Connection and Isolation Panel    Frequency of Communication with Friends and Family: Three times a week    Frequency of Social Gatherings with Friends and Family: Never    Attends Religious Services: Never    Database administrator or Organizations: No    Attends Banker Meetings: Not on file  Marital Status: Never married  Intimate Partner Violence: Not At Risk (03/01/2021)   Humiliation, Afraid, Rape, and Kick questionnaire    Fear of Current or Ex-Partner: No    Emotionally Abused: No    Physically Abused: No    Sexually Abused: No   Social History   Tobacco Use  Smoking Status Never   Passive exposure: Never  Smokeless Tobacco Never   Social History   Substance and Sexual Activity  Alcohol Use No   Comment: occasionally    Family History  Problem Relation Age of Onset   Diabetes Father    Early death Father      ROS: Denies fever, fatigue, unexplained weight loss/gain, chest pain, SHOB, and palpitations. Denies neurological deficits, gastrointestinal or genitourinary complaints, and skin changes.   Objective:   Today's Vitals   09/20/23 1455  BP: (!) 140/98  Pulse: 81  SpO2: 100%  Weight: 202 lb 12.8 oz (92 kg)  Height: 5' 6 (1.676 m)     GENERAL APPEARANCE: Well-appearing, in NAD. Well nourished.  SKIN: Pink, warm and dry. Turgor normal. No rash, lesion, ulceration, or ecchymoses. Hair evenly distributed.  HEENT: HEAD: Normocephalic.  EYES: PERRLA. EOMI. Lids intact w/o defect. Sclera white, Conjunctiva pink w/o exudate.  EARS: External ear w/o redness, swelling, masses or lesions. EAC clear. TM's intact, translucent w/o bulging, appropriate landmarks visualized. Appropriate acuity to conversational tones.  NOSE: Septum midline w/o deformity. Nares patent, mucosa pink and non-inflamed w/o drainage. No sinus tenderness.  THROAT: Uvula midline. Oropharynx clear. Tonsils non-inflamed w/o exudate. Oral mucosa pink and moist.  NECK: Supple, Trachea midline. Full ROM w/o pain or tenderness. No lymphadenopathy. Thyroid non-tender w/o enlargement or palpable masses.  BREASTS: Breasts pendulous, symmetrical, and w/o palpable masses. Nipples everted and w/o discharge. No rash or skin retraction. No axillary or supraclavicular lymphadenopathy.  RESPIRATORY: Chest wall symmetrical w/o masses. Respirations even and non-labored. Breath sounds clear to auscultation bilaterally. No wheezes, rales, rhonchi, or crackles. CARDIAC: S1, S2 present, regular rate and rhythm. No gallops, murmurs, rubs, or clicks. PMI w/o lifts, heaves, or thrills. No carotid bruits. Capillary refill <2 seconds. Peripheral pulses 2+ bilaterally. GI: Abdomen soft w/o distention. Normoactive bowel sounds. No palpable masses or tenderness. No guarding or rebound tenderness. Liver and spleen w/o tenderness or enlargement. No CVA tenderness.  GU: External genitalia without erythema, lesions, or masses. No lymphadenopathy. Vaginal mucosa pink and moist without exudate, lesions, or ulcerations. Cervix pink without discharge. Cervical os closed. Uterus and adnexae palpable, not enlarged, and w/o tenderness. No palpable masses.  MSK: Muscle tone and strength appropriate for age,  w/o atrophy or abnormal movement.  EXTREMITIES: Active ROM intact, w/o tenderness, crepitus, or contracture. No obvious joint deformities or effusions. No clubbing, edema, or cyanosis.  NEUROLOGIC: CN's II-XII intact. Motor strength symmetrical with no obvious weakness. No sensory deficits. DTR's 2+ symmetric bilaterally. Steady, even gait.  PSYCH/MENTAL STATUS: Alert, oriented x 3. Cooperative, appropriate mood and affect.   Chaperoned by Damien Many, CMA   Results for orders placed or performed in visit on 03/28/23  POCT Influenza A/B   Collection Time: 03/28/23 11:02 AM  Result Value Ref Range   Influenza A, POC Negative Negative   Influenza B, POC Negative Negative  POCT COVID BINAX NOW CARD   Collection Time: 03/28/23 11:02 AM  Result Value Ref Range   SARS Coronavirus 2 Ag Negative Negative    Assessment & Plan:  ***  No orders of the defined types were placed  in this encounter.   PATIENT COUNSELING:  - Encouraged a healthy well-balanced diet. Patient may adjust caloric intake to maintain or achieve ideal body weight. May reduce intake of dietary saturated fat and total fat and have adequate dietary potassium and calcium preferably from fresh fruits, vegetables, and low-fat dairy products.   - Advised to avoid cigarette smoking. - Discussed with the patient that most people either abstain from alcohol or drink within safe limits (<=14/week and <=4 drinks/occasion for males, <=7/weeks and <= 3 drinks/occasion for females) and that the risk for alcohol disorders and other health effects rises proportionally with the number of drinks per week and how often a drinker exceeds daily limits. - Discussed cessation/primary prevention of drug use and availability of treatment for abuse.  - Discussed sexually transmitted diseases, avoidance of unintended pregnancy and contraceptive alternatives.  - Stressed the importance of regular exercise - Injury prevention: Discussed safety belts,  safety helmets, smoke detector, smoking near bedding or upholstery.  - Dental health: Discussed importance of regular tooth brushing, flossing, and dental visits.   NEXT PREVENTATIVE PHYSICAL DUE IN 1 YEAR.  No follow-ups on file.  Patient to reach out to office if new, worrisome, or unresolved symptoms arise or if no improvement in patient's condition. Patient verbalized understanding and is agreeable to treatment plan. All questions answered to patient's satisfaction.    Thersia Schuyler Stark, [place]

## 2023-09-20 NOTE — Progress Notes (Signed)
 Subjective:   Megan Schneider [birth year]  09/20/2023   CC: Chief Complaint  Patient presents with   Annual Exam    Patient is here today for her physical. Wants to discuss her BP, weight, and pain in hands.    HPI: Megan Schneider is a 33 y.o. female who presents for a routine health maintenance exam.  Labs collected at time of visit.   HYPERTENSION: Tuleen Manansala presents for the medical management of hypertension.  Patient's current hypertension medication regimen is: olmesartan  20mg  daily  Patient is currently taking prescribed medications for HTN.  Patient is regularly keeping a check on BP at home (this morning BP check was 128/83).  Adhering to low sodium diet: yes; consumes 90oz daily  Exercising Regularly: no Denies headache, dizziness, CP, SHOB, vision changes.    BP Readings from Last 3 Encounters:  09/20/23 (!) 130/90  09/16/23 (!) 158/107  09/14/23 (!) 150/100   WEIGHT MANAGEMENT:  Pt states she is interested in starting a GLP-1 for weight loss, as she has been trying to lose weight and wants some help. Pt's diet consists of eating one meal per day, usually dinner. She states she does not have a regular exercise routine, as she works full time and has two young children at home. Her last A1c was 5.4% in 2023.   Wt Readings from Last 3 Encounters:  09/20/23 202 lb 12.8 oz (92 kg)  09/14/23 206 lb (93.4 kg)  07/27/23 206 lb 6.4 oz (93.6 kg)      PELVIC PAIN:  Pt reports hx of PCOS, states she intermittently has pain in her pelvic area. She uses ibuprofen  and heat, and that usually helps. She has done pelvic floor PT in the past.    HAND PAIN:  Pt reports intermittent bilateral hand pain for several months. Denies trauma or injury. Pain will begin spontaneously, and lasts for 1 week before resolving. She takes ibuprofen  with some relief.    HEALTH SCREENINGS: - Vision Screening: up to date - Dental Visits: up to date - Pap smear: up to date -  Breast Exam: up to date - STD Screening: Declined - Mammogram (40+): Not applicable  - Colonoscopy (45+): Not applicable  - Bone Density (65+ or under 65 with predisposing conditions): Not applicable  - Lung CA screening with low-dose CT:  Not applicable Adults age 110-80 who are current cigarette smokers or quit within the last 15 years. Must have 20 pack year history.    Depression and Anxiety Screen done today and results listed below:     09/20/2023    2:58 PM 09/14/2023    2:06 PM 03/28/2023   10:36 AM 01/05/2023    4:19 PM 11/08/2022    4:21 PM  Depression screen PHQ 2/9  Decreased Interest 0 0 0 0 1  Down, Depressed, Hopeless 0 0 0 0 1  PHQ - 2 Score 0 0 0 0 2  Altered sleeping 3  0 2 3  Tired, decreased energy 1  0 1 1  Change in appetite 3  0 1 1  Feeling bad or failure about yourself  0  0 0 0  Trouble concentrating 0  0 0 0  Moving slowly or fidgety/restless 0  0 0 0  Suicidal thoughts 0  0 0 0  PHQ-9 Score 7  0 4 7  Difficult doing work/chores Not difficult at all  Not difficult at all Not difficult at all Not difficult at all  09/20/2023    2:59 PM 03/28/2023   10:36 AM 01/05/2023    4:20 PM 11/08/2022    4:21 PM  GAD 7 : Generalized Anxiety Score  Nervous, Anxious, on Edge 0 0 0 1  Control/stop worrying 0 0 0 1  Worry too much - different things 0 0 0 1  Trouble relaxing 0 0 0 1  Restless 0 0 0 0  Easily annoyed or irritable 0 0 0 1  Afraid - awful might happen 0 0 0 0  Total GAD 7 Score 0 0 0 5  Anxiety Difficulty Not difficult at all Not difficult at all Not difficult at all Not difficult at all    IMMUNIZATIONS: - Tdap: Tetanus vaccination status reviewed: last tetanus booster within 10 years. - HPV: Up to date - Influenza: Postponed to flu season - Pneumovax: Not applicable - Prevnar 20: Not applicable - Shingrix (50+): Not applicable   Past medical history, surgical history, medications, allergies, family history and social history reviewed with  patient today and changes made to appropriate areas of the chart.   Past Medical History:  Diagnosis Date   Anxiety    Asthma    occasionally, exercise-induced; uses inhaler; not used in a while   Cannabis hyperemesis syndrome concurrent with and due to cannabis abuse (HCC) 08/02/2018   Gestational hypertension 01/25/2019   Hemorrhoids during puerperium with postpartum complication 01/31/2019   Hypertension    started as gestational   Marijuana use 08/24/2016   Positive tox screen in Jan 2018   Scoliosis     Past Surgical History:  Procedure Laterality Date   NO PAST SURGERIES      Current Outpatient Medications on File Prior to Visit  Medication Sig   albuterol  (VENTOLIN  HFA) 108 (90 Base) MCG/ACT inhaler Inhale 1-2 puffs into the lungs every 4 (four) hours as needed for wheezing.   Blood Pressure KIT Z34.90 To monitored regularly at home Large   chlorthalidone  (HYGROTON ) 25 MG tablet Take 1 tablet (25 mg total) by mouth daily.   ibuprofen  (ADVIL ) 600 MG tablet Take 600 mg by mouth every 6 (six) hours as needed.   naproxen  sodium (ANAPROX ) 275 MG tablet Take 2 tablets (550 mg total) by mouth 2 (two) times daily with a meal.   olmesartan  (BENICAR ) 20 MG tablet Take 1 tablet (20 mg total) by mouth daily.   No current facility-administered medications on file prior to visit.    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  Tobacco Use   Smoking status: Never    Passive exposure: Never   Smokeless tobacco: Never  Vaping Use   Vaping status: Never Used  Substance and Sexual Activity   Alcohol use: No    Comment: occasionally   Drug use: Not Currently    Types: Marijuana    Comment: reported last time was when [redacted]  weeks pregnant   Sexual activity: Not Currently    Birth control/protection: Injection, None  Other Topics Concern   Not on file  Social History Narrative   Not on file   Social Drivers of Health   Financial Resource Strain: High Risk (09/20/2023)   Overall Financial Resource Strain (CARDIA)  Difficulty of Paying Living Expenses: Hard  Food Insecurity: Food Insecurity Present (09/20/2023)   Hunger Vital Sign    Worried About Running Out of Food in the Last Year: Sometimes true    Ran Out of Food in the Last Year: Sometimes true  Transportation Needs: No Transportation Needs (09/20/2023)   PRAPARE - Administrator, Civil Service (Medical): No    Lack of Transportation (Non-Medical): No  Physical Activity: Inactive (09/20/2023)   Exercise Vital Sign    Days of Exercise per Week: 0 days    Minutes of Exercise per Session: Not on file  Stress: Stress Concern Present (09/20/2023)   [employer] of Occupational Health - Occupational Stress Questionnaire    Feeling of Stress: To some extent  Social Connections: Socially Isolated (09/20/2023)   Social Connection and Isolation Panel    Frequency of Communication with Friends and Family: Three times a week    Frequency of Social Gatherings with Friends and Family: Never    Attends Religious Services: Never    Database administrator or Organizations: No    Attends Engineer, structural: Not on file    Marital Status: Never married  Intimate Partner Violence: Not At Risk (03/01/2021)   Humiliation, Afraid, Rape, and Kick questionnaire    Fear of Current or Ex-Partner: No    Emotionally Abused: No    Physically Abused: No    Sexually Abused: No   Social History   Tobacco Use  Smoking Status Never   Passive exposure: Never  Smokeless Tobacco Never   Social History   Substance and Sexual Activity  Alcohol Use No   Comment: occasionally    Family History  Problem Relation Age of Onset   Diabetes Father     Early death Father      ROS: Denies fever, fatigue, unexplained weight loss/gain, chest pain, SHOB, and palpitations. Denies neurological deficits, gastrointestinal or genitourinary complaints, and skin changes.   Objective:   Today's Vitals   09/20/23 1455 09/20/23 1534  BP: (!) 140/98 (!) 130/90  Pulse: 81   SpO2: 100%   Weight: 202 lb 12.8 oz (92 kg)   Height: 5' 6 (1.676 m)     GENERAL APPEARANCE: Well-appearing, in NAD. Well nourished.  SKIN: Pink, warm and dry. Turgor normal. No rash, lesion, ulceration, or ecchymoses. Hair evenly distributed.  HEENT: HEAD: Normocephalic.  EYES: PERRLA. EOMI. Lids intact w/o defect. Sclera white, Conjunctiva pink w/o exudate.  EARS: External ear w/o redness, swelling, masses or lesions. EAC clear. TM's intact, translucent w/o bulging, appropriate landmarks visualized. Appropriate acuity to conversational tones.  NOSE: Septum midline w/o deformity. Nares patent, mucosa pink and non-inflamed w/o drainage. No sinus tenderness.  THROAT: Uvula midline. Oropharynx clear. Tonsils non-inflamed w/o exudate. Oral mucosa pink and moist.  NECK: Supple, Trachea midline. Full ROM w/o pain or tenderness. No lymphadenopathy. Thyroid non-tender w/o enlargement or palpable masses.  RESPIRATORY: Chest wall symmetrical w/o masses. Respirations even and non-labored. Breath sounds clear to auscultation bilaterally. No wheezes, rales, rhonchi, or crackles. CARDIAC: S1, S2 present, regular rate and rhythm. No gallops, murmurs, rubs, or clicks. PMI w/o lifts, heaves, or thrills. No carotid bruits. Capillary refill <2 seconds. Peripheral pulses 2+ bilaterally. GI: Abdomen soft w/o distention. Normoactive bowel sounds. No palpable masses or tenderness. No guarding or rebound tenderness. Liver and spleen w/o tenderness or enlargement. No CVA tenderness.  MSK: Muscle tone and strength appropriate for age, w/o atrophy or abnormal movement.  EXTREMITIES: Active  ROM intact, w/o  tenderness, crepitus, or contracture. No obvious joint deformities or effusions. Ace wrap to left ankle.  NEUROLOGIC: CN's II-XII intact. Motor strength symmetrical with no obvious weakness. No sensory deficits. DTR's 2+ symmetric bilaterally. Steady, even gait.  PSYCH/MENTAL STATUS: Alert, oriented x 3. Cooperative, appropriate mood and affect.     Assessment & Plan:  1. Annual physical exam (Primary) Discussed preventative screenings, vaccines, and healthy lifestyle with patient. Will obtain labs fasting today.  - CBC with Differential/Platelet - Hemoglobin A1c - TSH - Comprehensive metabolic panel with GFR  2. Screening for lipid disorders - Lipid panel  3. Primary hypertension Improving. Repeat BP improved during office visit. Pt will RTO in 4wks for nurse BP recheck.   4. Obesity, Class I, BMI 30-34.9 Pt is encouraged to consume a well-balanced diet, low carb/high fiber/high protein. Also discussed increasing the number of meals throughout the day, for which pt agreed. Discussed incorporating strength training exercise 2-3x weekly as well to aid in weight loss.   5. Bilateral hand pain Pt encouraged to continue ibuprofen  during episodes of hand pain. Labs collected today to r/o autoimmune root cause.  - Sed Rate (ESR) - VITAMIN D  25 Hydroxy (Vit-D Deficiency, Fractures) - ANA  6. Pelvic pain Chronic per chart review. Pt encouraged to try magnesium glycinate for pelvic pain; okay to continue ibuprofen  and heat therapy. Offered Rx cyclobenzaprine , however pt declined, stating she has taken that in the past and did not like the side effects. Discussed the benefits of pelvic floor PT and PCOS; pt states she still has the exercise bands at home from previous sessions and will restart exercises on her own.    Orders Placed This Encounter  Procedures   Sed Rate (ESR)   VITAMIN D  25 Hydroxy (Vit-D Deficiency, Fractures)   CBC with Differential/Platelet   Hemoglobin A1c   TSH    Lipid panel   Comprehensive metabolic panel with GFR   ANA    PATIENT COUNSELING:  - Encouraged a healthy well-balanced diet. Patient may adjust caloric intake to maintain or achieve ideal body weight. May reduce intake of dietary saturated fat and total fat and have adequate dietary potassium and calcium preferably from fresh fruits, vegetables, and low-fat dairy products.   - Advised to avoid cigarette smoking. - Discussed with the patient that most people either abstain from alcohol or drink within safe limits (<=14/week and <=4 drinks/occasion for males, <=7/weeks and <= 3 drinks/occasion for females) and that the risk for alcohol disorders and other health effects rises proportionally with the number of drinks per week and how often a drinker exceeds daily limits. - Discussed cessation/primary prevention of drug use and availability of treatment for abuse.  - Discussed sexually transmitted diseases, avoidance of unintended pregnancy and contraceptive alternatives.  - Stressed the importance of regular exercise - Injury prevention: Discussed safety belts, safety helmets, smoke detector, smoking near bedding or upholstery.  - Dental health: Discussed importance of regular tooth brushing, flossing, and dental visits.   NEXT PREVENTATIVE PHYSICAL DUE IN 1 YEAR.  Return in about 4 months (around 01/20/2024) for 4 weeks for nurse BP recheck; 4 months for weight & Hypertension.  Patient to reach out to office if new, worrisome, or unresolved symptoms arise or if no improvement in patient's condition. Patient verbalized understanding and is agreeable to treatment plan. All questions answered to patient's satisfaction.   Thersia Stark, FNP-C

## 2023-09-20 NOTE — Patient Instructions (Signed)
 Magnesium glycinate

## 2023-09-21 ENCOUNTER — Ambulatory Visit (HOSPITAL_BASED_OUTPATIENT_CLINIC_OR_DEPARTMENT_OTHER): Payer: Self-pay | Admitting: Family Medicine

## 2023-09-21 DIAGNOSIS — R7989 Other specified abnormal findings of blood chemistry: Secondary | ICD-10-CM

## 2023-09-21 DIAGNOSIS — E559 Vitamin D deficiency, unspecified: Secondary | ICD-10-CM

## 2023-09-21 LAB — CBC WITH DIFFERENTIAL/PLATELET
Basophils Absolute: 0.1 x10E3/uL (ref 0.0–0.2)
Basos: 1 %
EOS (ABSOLUTE): 0.1 x10E3/uL (ref 0.0–0.4)
Eos: 2 %
Hematocrit: 43.4 % (ref 34.0–46.6)
Hemoglobin: 14.4 g/dL (ref 11.1–15.9)
Immature Grans (Abs): 0 x10E3/uL (ref 0.0–0.1)
Immature Granulocytes: 0 %
Lymphocytes Absolute: 2.8 x10E3/uL (ref 0.7–3.1)
Lymphs: 49 %
MCH: 30.9 pg (ref 26.6–33.0)
MCHC: 33.2 g/dL (ref 31.5–35.7)
MCV: 93 fL (ref 79–97)
Monocytes Absolute: 0.6 x10E3/uL (ref 0.1–0.9)
Monocytes: 9 %
Neutrophils Absolute: 2.3 x10E3/uL (ref 1.4–7.0)
Neutrophils: 39 %
Platelets: 236 x10E3/uL (ref 150–450)
RBC: 4.66 x10E6/uL (ref 3.77–5.28)
RDW: 13.9 % (ref 11.7–15.4)
WBC: 5.9 x10E3/uL (ref 3.4–10.8)

## 2023-09-21 LAB — LIPID PANEL
Chol/HDL Ratio: 5.3 ratio — ABNORMAL HIGH (ref 0.0–4.4)
Cholesterol, Total: 191 mg/dL (ref 100–199)
HDL: 36 mg/dL — ABNORMAL LOW (ref >39)
LDL Chol Calc (NIH): 139 mg/dL — ABNORMAL HIGH (ref 0–99)
Triglycerides: 89 mg/dL (ref 0–149)
VLDL Cholesterol Cal: 16 mg/dL (ref 5–40)

## 2023-09-21 LAB — COMPREHENSIVE METABOLIC PANEL WITH GFR
ALT: 13 IU/L (ref 0–32)
AST: 20 IU/L (ref 0–40)
Albumin: 4.6 g/dL (ref 3.9–4.9)
Alkaline Phosphatase: 88 IU/L (ref 44–121)
BUN/Creatinine Ratio: 11 (ref 9–23)
BUN: 11 mg/dL (ref 6–20)
Bilirubin Total: 0.6 mg/dL (ref 0.0–1.2)
CO2: 19 mmol/L — ABNORMAL LOW (ref 20–29)
Calcium: 9.5 mg/dL (ref 8.7–10.2)
Chloride: 104 mmol/L (ref 96–106)
Creatinine, Ser: 1.04 mg/dL — ABNORMAL HIGH (ref 0.57–1.00)
Globulin, Total: 2.4 g/dL (ref 1.5–4.5)
Glucose: 83 mg/dL (ref 70–99)
Potassium: 4.4 mmol/L (ref 3.5–5.2)
Sodium: 139 mmol/L (ref 134–144)
Total Protein: 7 g/dL (ref 6.0–8.5)
eGFR: 74 mL/min/1.73 (ref >59)

## 2023-09-21 LAB — ANA: Anti Nuclear Antibody (ANA): NEGATIVE

## 2023-09-21 LAB — TSH: TSH: 0.876 u[IU]/mL (ref 0.450–4.500)

## 2023-09-21 LAB — HEMOGLOBIN A1C
Est. average glucose Bld gHb Est-mCnc: 111 mg/dL
Hgb A1c MFr Bld: 5.5 % (ref 4.8–5.6)

## 2023-09-21 LAB — SEDIMENTATION RATE: Sed Rate: 12 mm/h (ref 0–32)

## 2023-09-21 LAB — VITAMIN D 25 HYDROXY (VIT D DEFICIENCY, FRACTURES): Vit D, 25-Hydroxy: 10.2 ng/mL — ABNORMAL LOW (ref 30.0–100.0)

## 2023-09-21 MED ORDER — CHOLECALCIFEROL 1.25 MG (50000 UT) PO CAPS: 50000.0000 [IU] | ORAL_CAPSULE | ORAL | 0 refills | Status: DC

## 2023-09-21 NOTE — Progress Notes (Signed)
 Hi Megan Schneider, Your workup for possible autoimmune inflammatory issues was negative.  Your thyroid function is stable and your A1c was negative for prediabetes.  Your blood counts are within normal limits no signs of anemia.  However, your kidney function was slightly elevated.  How often are you taking medications like naproxen  or ibuprofen ?  How much water to drink in a day?  This may be affecting your kidney function and I would like to repeat this in a couple weeks.  Additionally, your LDL or bad cholesterol was slightly elevated.  Watching your dietary intake of fatty foods, greasy foods and regular exercise can help to lower your risk of heart disease.  We will repeat this in 1 year.  Additionally, your vitamin D  is in the deficient category.  I will send in vitamin D  50,000 units for you to take 1 capsule once a week and we will plan on following up in approximately 6 months to repeat this.  Please reply to this message to discuss your kidney function.

## 2023-09-21 NOTE — Telephone Encounter (Signed)
 Please see message sent by pt about results.

## 2023-09-22 ENCOUNTER — Other Ambulatory Visit (HOSPITAL_BASED_OUTPATIENT_CLINIC_OR_DEPARTMENT_OTHER): Payer: Self-pay

## 2023-09-22 ENCOUNTER — Other Ambulatory Visit (HOSPITAL_BASED_OUTPATIENT_CLINIC_OR_DEPARTMENT_OTHER): Payer: Self-pay | Admitting: Family Medicine

## 2023-09-22 DIAGNOSIS — E559 Vitamin D deficiency, unspecified: Secondary | ICD-10-CM

## 2023-09-22 MED ORDER — CHOLECALCIFEROL 1.25 MG (50000 UT) PO CAPS
50000.0000 [IU] | ORAL_CAPSULE | ORAL | 0 refills | Status: AC
  Filled 2023-09-22 – 2023-09-26 (×3): qty 12, 84d supply, fill #0

## 2023-09-25 ENCOUNTER — Encounter (HOSPITAL_BASED_OUTPATIENT_CLINIC_OR_DEPARTMENT_OTHER): Payer: Self-pay

## 2023-09-26 ENCOUNTER — Other Ambulatory Visit (HOSPITAL_COMMUNITY): Payer: Self-pay

## 2023-09-26 ENCOUNTER — Other Ambulatory Visit (HOSPITAL_BASED_OUTPATIENT_CLINIC_OR_DEPARTMENT_OTHER): Payer: Self-pay

## 2023-09-27 ENCOUNTER — Other Ambulatory Visit: Payer: Self-pay

## 2023-09-28 ENCOUNTER — Other Ambulatory Visit (HOSPITAL_COMMUNITY): Payer: Self-pay

## 2023-10-02 ENCOUNTER — Encounter (HOSPITAL_BASED_OUTPATIENT_CLINIC_OR_DEPARTMENT_OTHER): Payer: Self-pay

## 2023-10-02 ENCOUNTER — Other Ambulatory Visit: Payer: Self-pay

## 2023-10-02 ENCOUNTER — Other Ambulatory Visit (HOSPITAL_BASED_OUTPATIENT_CLINIC_OR_DEPARTMENT_OTHER): Payer: Self-pay

## 2023-10-03 ENCOUNTER — Other Ambulatory Visit: Payer: Self-pay

## 2023-10-10 NOTE — Telephone Encounter (Signed)
 Thersia, please see mychart messages sent. I do see that BMP was placed prior. Please advise if that is the only lab that pt will need to get or if there are any other labs that need to be ordered.

## 2023-10-12 ENCOUNTER — Ambulatory Visit (HOSPITAL_BASED_OUTPATIENT_CLINIC_OR_DEPARTMENT_OTHER)

## 2023-10-12 ENCOUNTER — Encounter (HOSPITAL_BASED_OUTPATIENT_CLINIC_OR_DEPARTMENT_OTHER): Payer: Self-pay

## 2023-10-12 ENCOUNTER — Other Ambulatory Visit (HOSPITAL_BASED_OUTPATIENT_CLINIC_OR_DEPARTMENT_OTHER): Payer: Self-pay | Admitting: Family Medicine

## 2023-10-12 VITALS — BP 140/97 | HR 82 | Ht 66.0 in | Wt 205.0 lb

## 2023-10-12 DIAGNOSIS — R7989 Other specified abnormal findings of blood chemistry: Secondary | ICD-10-CM

## 2023-10-12 DIAGNOSIS — Z3042 Encounter for surveillance of injectable contraceptive: Secondary | ICD-10-CM | POA: Diagnosis not present

## 2023-10-12 LAB — BASIC METABOLIC PANEL WITH GFR
BUN/Creatinine Ratio: 9 (ref 9–23)
BUN: 10 mg/dL (ref 6–20)
CO2: 21 mmol/L (ref 20–29)
Calcium: 9.4 mg/dL (ref 8.7–10.2)
Chloride: 106 mmol/L (ref 96–106)
Creatinine, Ser: 1.07 mg/dL — ABNORMAL HIGH (ref 0.57–1.00)
Glucose: 84 mg/dL (ref 70–99)
Potassium: 3.9 mmol/L (ref 3.5–5.2)
Sodium: 142 mmol/L (ref 134–144)
eGFR: 71 mL/min/1.73 (ref >59)

## 2023-10-12 MED ORDER — MEDROXYPROGESTERONE ACETATE 150 MG/ML IM SUSY
150.0000 mg | PREFILLED_SYRINGE | Freq: Once | INTRAMUSCULAR | Status: AC
  Administered 2023-10-12: 150 mg via INTRAMUSCULAR

## 2023-10-12 NOTE — Progress Notes (Signed)
 NURSE VISIT- INJECTION  SUBJECTIVE:  Megan Schneider is a 33 y.o. G34P2002 female here for a Depo Provera  for contraception/period management. She is a GYN patient.   OBJECTIVE:  BP (!) 140/97   Pulse 82   Ht 5' 6 (1.676 m)   Wt 205 lb (93 kg)   BMI 33.09 kg/m   Appears well, in no apparent distress  Injection administered in: Left upper quad. gluteus  Meds ordered this encounter  Medications   medroxyPROGESTERone  Acetate SUSY 150 mg    ASSESSMENT: GYN patient Depo Provera  for contraception/period management PLAN: Follow-up: in 11-13 weeks for next Depo

## 2023-10-17 ENCOUNTER — Other Ambulatory Visit (HOSPITAL_BASED_OUTPATIENT_CLINIC_OR_DEPARTMENT_OTHER): Payer: Self-pay | Admitting: Obstetrics & Gynecology

## 2023-10-17 ENCOUNTER — Encounter (HOSPITAL_BASED_OUTPATIENT_CLINIC_OR_DEPARTMENT_OTHER): Payer: Self-pay | Admitting: Family Medicine

## 2023-10-17 DIAGNOSIS — I10 Essential (primary) hypertension: Secondary | ICD-10-CM

## 2023-10-17 NOTE — Telephone Encounter (Signed)
 Please see mychart message sent by pt and advise.

## 2023-10-18 ENCOUNTER — Ambulatory Visit (HOSPITAL_BASED_OUTPATIENT_CLINIC_OR_DEPARTMENT_OTHER): Payer: Self-pay | Admitting: Family Medicine

## 2023-10-18 ENCOUNTER — Other Ambulatory Visit (HOSPITAL_BASED_OUTPATIENT_CLINIC_OR_DEPARTMENT_OTHER): Payer: Self-pay | Admitting: Family Medicine

## 2023-10-18 DIAGNOSIS — I10 Essential (primary) hypertension: Secondary | ICD-10-CM

## 2023-10-18 MED ORDER — OLMESARTAN MEDOXOMIL 40 MG PO TABS: 40.0000 mg | ORAL_TABLET | Freq: Every day | ORAL | 3 refills | Status: DC

## 2023-10-18 MED ORDER — NIFEDIPINE ER OSMOTIC RELEASE 30 MG PO TB24
30.0000 mg | ORAL_TABLET | Freq: Every day | ORAL | 3 refills | Status: DC
Start: 2023-10-18 — End: 2023-11-06

## 2023-10-18 NOTE — Telephone Encounter (Signed)
 Pt does need new Rx sent to pharmacy. Sending to [place] to see if she wants to do a full 30-day supply of the 40mg  olmesartan  or if she wanted to send in enough to hold pt over until after BP recheck in about 1-2 weeks.

## 2023-10-18 NOTE — Progress Notes (Signed)
 Renal function declined. Will obtain urine and likely renal ultrasound. Will reply to patient regarding this in Mychart messaging regarding BP control.

## 2023-10-19 ENCOUNTER — Other Ambulatory Visit (HOSPITAL_BASED_OUTPATIENT_CLINIC_OR_DEPARTMENT_OTHER): Payer: Self-pay | Admitting: Family Medicine

## 2023-10-19 DIAGNOSIS — I10 Essential (primary) hypertension: Secondary | ICD-10-CM

## 2023-10-19 DIAGNOSIS — N289 Disorder of kidney and ureter, unspecified: Secondary | ICD-10-CM

## 2023-10-27 ENCOUNTER — Ambulatory Visit (HOSPITAL_COMMUNITY)
Admission: RE | Admit: 2023-10-27 | Discharge: 2023-10-27 | Disposition: A | Discharge status: Home / Self Care | Source: Ambulatory Visit | Attending: Family Medicine | Admitting: Family Medicine

## 2023-10-27 DIAGNOSIS — N289 Disorder of kidney and ureter, unspecified: Secondary | ICD-10-CM | POA: Diagnosis present

## 2023-10-27 DIAGNOSIS — I10 Essential (primary) hypertension: Secondary | ICD-10-CM | POA: Diagnosis present

## 2023-10-31 ENCOUNTER — Ambulatory Visit (HOSPITAL_BASED_OUTPATIENT_CLINIC_OR_DEPARTMENT_OTHER): Admitting: Family Medicine

## 2023-11-02 ENCOUNTER — Other Ambulatory Visit (HOSPITAL_BASED_OUTPATIENT_CLINIC_OR_DEPARTMENT_OTHER): Payer: Self-pay | Admitting: Family Medicine

## 2023-11-02 ENCOUNTER — Ambulatory Visit (HOSPITAL_BASED_OUTPATIENT_CLINIC_OR_DEPARTMENT_OTHER): Payer: Self-pay | Admitting: Family Medicine

## 2023-11-02 DIAGNOSIS — N289 Disorder of kidney and ureter, unspecified: Secondary | ICD-10-CM

## 2023-11-02 DIAGNOSIS — I10 Essential (primary) hypertension: Secondary | ICD-10-CM

## 2023-11-02 NOTE — Telephone Encounter (Signed)
Please see new message sent by pt.

## 2023-11-02 NOTE — Progress Notes (Signed)
 Hi Megan Schneider,  Your renal artery ultrasound does not show any decreased flow or anatomic issues. How has your blood pressure been running? At this time, I would like you to see nephrology given decreased kidney function without cause. If you are agreeable to this, please let me know.

## 2023-11-06 ENCOUNTER — Other Ambulatory Visit (HOSPITAL_BASED_OUTPATIENT_CLINIC_OR_DEPARTMENT_OTHER): Payer: Self-pay | Admitting: Family Medicine

## 2023-11-06 MED ORDER — NIFEDIPINE ER 60 MG PO TB24: 60.0000 mg | ORAL_TABLET | Freq: Every day | ORAL | 3 refills | Status: DC

## 2023-11-06 MED ORDER — NIFEDIPINE ER OSMOTIC RELEASE 60 MG PO TB24: 60.0000 mg | ORAL_TABLET | Freq: Every day | ORAL | 3 refills | Status: DC

## 2023-11-20 ENCOUNTER — Encounter (HOSPITAL_BASED_OUTPATIENT_CLINIC_OR_DEPARTMENT_OTHER): Payer: Self-pay | Admitting: Family Medicine

## 2023-11-20 NOTE — Telephone Encounter (Signed)
 Please see mychart message sent by pt and advise.

## 2023-11-30 ENCOUNTER — Ambulatory Visit (HOSPITAL_BASED_OUTPATIENT_CLINIC_OR_DEPARTMENT_OTHER): Admitting: Family Medicine

## 2023-11-30 ENCOUNTER — Encounter (HOSPITAL_BASED_OUTPATIENT_CLINIC_OR_DEPARTMENT_OTHER): Payer: Self-pay | Admitting: Family Medicine

## 2023-11-30 VITALS — BP 150/98 | HR 81 | Ht 66.0 in | Wt 208.0 lb

## 2023-11-30 DIAGNOSIS — I10 Essential (primary) hypertension: Secondary | ICD-10-CM

## 2023-11-30 DIAGNOSIS — R1084 Generalized abdominal pain: Secondary | ICD-10-CM

## 2023-11-30 MED ORDER — LABETALOL HCL 100 MG PO TABS: 100.0000 mg | ORAL_TABLET | Freq: Two times a day (BID) | ORAL | 3 refills | Status: DC

## 2023-11-30 NOTE — Progress Notes (Signed)
 Subjective:   Megan Schneider July 20, 1990 11/30/2023  Chief Complaint  Patient presents with   Medical Management of Chronic Issues    Pt states that she still has been having problems with headaches after medication changes. States that she still has some days where her BP is good but also is still having days where BP is elevated. Is taking meds as prescribed.    Discussed the use of AI scribe software for clinical note transcription with the patient, who gave verbal consent to proceed.  History of Present Illness Megan Schneider is a 33 year old female with hypertension who presents for follow-up on blood pressure management.  She has been experiencing fluctuations in her blood pressure, with elevated readings on days she feels well and normal readings on days she has migraines. Today, her blood pressure has been high despite medication use, and she developed a migraine about an hour ago.  She has been taking olmesartan  40mg  and nifedipine  60mg , which initially controlled her blood pressure effectively, but its efficacy has decreased over time. This morning, her blood pressure was 155/102 mmHg, which decreased to 138/105 mmHg after taking olmesartan , but she subsequently developed a migraine. Her hypertension began following the birth of her daughter and has been a concern for the past ten months. A renal ultrasound was performed, showing no narrowing of the arteries.  She has noticed an increase in the frequency of her migraines over the last few months, with more frequent episodes in the past week. Previously, migraines were infrequent, occurring 'once in a blue moment.' She is currently taking olmesartan  and takes a medication for her migraines, though she is unsure of the name.     The following portions of the patient's history were reviewed and updated as appropriate: past medical history, past surgical history, family history, social history, allergies, medications, and  problem list.   Patient Active Problem List   Diagnosis Date Noted   Obesity, Class I, BMI 30-34.9 09/20/2023   Bilateral hand pain 09/20/2023   Oral candidiasis 04/11/2023   Secondary infection of skin 03/17/2021   GAD (generalized anxiety disorder) 03/01/2021   Mixed obsessional thoughts and acts 03/01/2021   PCOS (polycystic ovarian syndrome) 03/01/2021   Primary hypertension 03/01/2021   Mild intermittent asthma without complication 03/01/2021   Pelvic pain 11/29/2020   Pelvic floor dysfunction in female 11/29/2020   Chronic bilateral low back pain without sciatica 01/25/2019   Subclinical hyperthyroidism 07/14/2018   Dilated pore of [place] 06/29/2016   Past Medical History:  Diagnosis Date   Anxiety    Asthma    occasionally, exercise-induced; uses inhaler; not used in a while   Cannabis hyperemesis syndrome concurrent with and due to cannabis abuse 08/02/2018   Gestational hypertension 01/25/2019   Hemorrhoids during puerperium with postpartum complication 01/31/2019   Hypertension    started as gestational   Marijuana use 08/24/2016   Positive tox screen in Jan 2018   Scoliosis    Past Surgical History:  Procedure Laterality Date   NO PAST SURGERIES     Family History  Problem Relation Age of Onset   Diabetes Father    Early death Father    Outpatient Medications Prior to Visit  Medication Sig Dispense Refill   albuterol  (VENTOLIN  HFA) 108 (90 Base) MCG/ACT inhaler Inhale 1-2 puffs into the lungs every 4 (four) hours as needed for wheezing. 8 g 5   Blood Pressure KIT Z34.90 To monitored regularly at home Large 1 each 0  Cholecalciferol  1.25 MG (50000 UT) capsule Take 1 capsule (50,000 Units total) by mouth once a week. 12 capsule 0   ibuprofen  (ADVIL ) 600 MG tablet Take 600 mg by mouth every 6 (six) hours as needed.     NIFEdipine  (PROCARDIA  XL/NIFEDICAL XL) 60 MG 24 hr tablet Take 1 tablet (60 mg total) by mouth daily. 30 tablet 3   olmesartan  (BENICAR ) 40  MG tablet Take 1 tablet (40 mg total) by mouth daily. 90 tablet 3   naproxen  sodium (ANAPROX ) 275 MG tablet Take 2 tablets (550 mg total) by mouth 2 (two) times daily with a meal. 60 tablet 0   No facility-administered medications prior to visit.   No Known Allergies   ROS: A complete ROS was performed with pertinent positives/negatives noted in the HPI. The remainder of the ROS are negative.    Objective:   Today's Vitals   11/30/23 1533 11/30/23 1601  BP: (!) 151/103 (!) 150/98  Pulse: 81   SpO2: 100%   Weight: 208 lb (94.3 kg)   Height: 5' 6 (1.676 m)     Physical Exam   GENERAL: Well-appearing, in NAD. Well nourished.  SKIN: Pink, warm and dry.  Head: Normocephalic. NECK: Trachea midline. Full ROM w/o pain or tenderness.  RESPIRATORY: Chest wall symmetrical. Respirations even and non-labored. Breath sounds clear to auscultation bilaterally.  CARDIAC: S1, S2 present, regular rate and rhythm without murmur or gallops. Peripheral pulses 2+ bilaterally.  MSK: Muscle tone and strength appropriate for age.  NEUROLOGIC: No motor or sensory deficits. Steady, even gait. C2-C12 intact.  PSYCH/MENTAL STATUS: Alert, oriented x 3. Cooperative, appropriate mood and affect.   EKG:  Vent. rate 64 BPM PR interval 134 ms QRS duration 90 ms QT/QTcB 384/396 ms P-R-T axes 39 63 35  Normal sinus rhythm Normal ECG No previous ECGs available.     Assessment & Plan:   1. Uncontrolled hypertension (Primary) EKG is unremarkable for contributing factors to HTN. Recommend patient establish with [facility] and she was agreeable. She has an appt with [facility] next week regarding declining renal function. Will start Labetalol 100mg  BID daily and continue current regimen of Nifedipine  and Olmesartan . Will continue to monitor BP and return in 1 week for BP check.  - Ambulatory referral to [facility]   Meds ordered this encounter  Medications   labetalol  (NORMODYNE) 100 MG tablet    Sig: Take 1 tablet (100 mg total) by mouth 2 (two) times daily.    Dispense:  60 tablet    Refill:  3    Supervising Provider:   DE PERU, RAYMOND J [[id number]]   Lab Orders  No laboratory test(s) ordered today    Return in about 1 week (around 12/07/2023) for BP Check at 4pm Nurse Visit .    Patient to reach out to office if new, worrisome, or unresolved symptoms arise or if no improvement in patient's condition. Patient verbalized understanding and is agreeable to treatment plan. All questions answered to patient's satisfaction.    Megan Schneider, [place]

## 2023-12-04 NOTE — Telephone Encounter (Signed)
 Please see most recent message sent by pt and advise.

## 2023-12-05 NOTE — Telephone Encounter (Signed)
Please see new message sent by pt.

## 2023-12-07 ENCOUNTER — Ambulatory Visit (HOSPITAL_BASED_OUTPATIENT_CLINIC_OR_DEPARTMENT_OTHER)

## 2023-12-07 ENCOUNTER — Other Ambulatory Visit (HOSPITAL_BASED_OUTPATIENT_CLINIC_OR_DEPARTMENT_OTHER): Payer: Self-pay | Admitting: Family Medicine

## 2023-12-07 DIAGNOSIS — R0683 Snoring: Secondary | ICD-10-CM

## 2023-12-07 DIAGNOSIS — I10 Essential (primary) hypertension: Secondary | ICD-10-CM

## 2023-12-12 ENCOUNTER — Other Ambulatory Visit: Payer: Self-pay | Admitting: Nephrology

## 2023-12-12 DIAGNOSIS — N182 Chronic kidney disease, stage 2 (mild): Secondary | ICD-10-CM

## 2024-01-01 MED ORDER — PANTOPRAZOLE SODIUM 40 MG PO TBEC: DELAYED_RELEASE_TABLET | ORAL | 3 refills | Status: DC

## 2024-01-01 MED ORDER — DICYCLOMINE HCL 20 MG PO TABS: 20.0000 mg | ORAL_TABLET | Freq: Three times a day (TID) | ORAL | 0 refills | Status: DC

## 2024-01-01 NOTE — Addendum Note (Signed)
 Addended by: Shaconda Hajduk on: 01/01/2024 01:32 PM   Modules accepted: Orders

## 2024-01-16 ENCOUNTER — Encounter: Payer: Self-pay | Admitting: Nephrology

## 2024-01-25 ENCOUNTER — Encounter (HOSPITAL_BASED_OUTPATIENT_CLINIC_OR_DEPARTMENT_OTHER): Payer: Self-pay | Admitting: Family Medicine

## 2024-02-12 ENCOUNTER — Other Ambulatory Visit: Payer: Self-pay | Admitting: Nephrology

## 2024-02-12 DIAGNOSIS — N182 Chronic kidney disease, stage 2 (mild): Secondary | ICD-10-CM

## 2024-03-01 ENCOUNTER — Encounter (HOSPITAL_BASED_OUTPATIENT_CLINIC_OR_DEPARTMENT_OTHER): Payer: Self-pay

## 2024-03-04 ENCOUNTER — Encounter (HOSPITAL_BASED_OUTPATIENT_CLINIC_OR_DEPARTMENT_OTHER): Payer: Self-pay | Admitting: Family

## 2024-03-04 ENCOUNTER — Ambulatory Visit (INDEPENDENT_AMBULATORY_CARE_PROVIDER_SITE_OTHER): Admitting: Family

## 2024-03-04 VITALS — BP 158/98 | HR 70 | Ht 66.0 in | Wt 212.7 lb

## 2024-03-04 DIAGNOSIS — R0683 Snoring: Secondary | ICD-10-CM | POA: Diagnosis not present

## 2024-03-04 DIAGNOSIS — I10 Essential (primary) hypertension: Secondary | ICD-10-CM

## 2024-03-04 DIAGNOSIS — E782 Mixed hyperlipidemia: Secondary | ICD-10-CM

## 2024-03-04 MED ORDER — LABETALOL HCL 100 MG PO TABS: 100.0000 mg | ORAL_TABLET | Freq: Two times a day (BID) | ORAL | 1 refills | Status: AC

## 2024-03-04 MED ORDER — NIFEDIPINE ER OSMOTIC RELEASE 60 MG PO TB24: 60.0000 mg | ORAL_TABLET | Freq: Every day | ORAL | 1 refills | Status: DC

## 2024-03-04 MED ORDER — OLMESARTAN MEDOXOMIL 40 MG PO TABS: 40.0000 mg | ORAL_TABLET | Freq: Every day | ORAL | 3 refills | Status: AC

## 2024-03-04 MED ORDER — CHLORTHALIDONE 25 MG PO TABS: 25.0000 mg | ORAL_TABLET | Freq: Every day | ORAL | 1 refills | Status: AC

## 2024-03-04 NOTE — Patient Instructions (Addendum)
 Medication Instructions:   START taking Nifedipine  60 mg by mouth every evening   Labwork: TODAY AT [facility] ON THE 3RD FLOOR [address]-- Aldosterone + renin activity w/ ratio,  Catecholamines, fractionated, plasma, Lipoprotein A (LPA) , and   Metanephrines, plasma   Testing/Procedures:  WatchPAT?  Is a FDA cleared portable home sleep study test that uses a watch and 3 points of contact to monitor 7 different channels, including your heart rate, oxygen saturations, body position, snoring, and chest motion.  The study is easy to use from the comfort of your own home and accurately detect sleep apnea.  Before bed, you attach the chest sensor, attached the sleep apnea bracelet to your nondominant hand, and attach the finger probe.  After the study, the raw data is downloaded from the watch and scored for apnea events.   For more information: [email or web address].   We have ordered this study and once approved by insurance it will be mailed to your home.       Follow-Up: Please follow up in _2_ months in [facility] with Dr. Raford, Reche Finder, NP or Allean Mink PharmD

## 2024-03-04 NOTE — Progress Notes (Signed)
 "  [facility]pert[facility] Initial Assessment:    Date:  03/04/2024   ID:  Megan Schneider, DOB 11-12-1990, MRN [id number]  PCP:  Knute Thersia Bitters, FNP  Cardiologist:  None  Nephrologist:  Referring MD: Knute Thersia Bitters, FNP   CC: Hypertension  History of Present Illness:    Megan Schneider is a 34 y.o. female with a hx of hypertension, migraine here to establish care in the [facility]pertension [facility].   Prior labs 09/2023 normal TSH 0.876. 10/2023 renal artery duplex with no stenosis. PCP has referred to pulmonary for consideration of sleep study.   She has been referred by her primary care provider for elevated blood pressure.  Per their notes this worsened after delivery of her child.  Kip Grime was diagnosed with hypertension after hypertension in 2020. Had to take medication after delivery of her daughter, did not have hypertension during pregnancy just afterward. It has been difficult to control. Blood pressure checked with arm cuff at home. Readings have been elevated most often 150s/90s. she reports tobacco use never. Alcohol use infrequently, only socially. For exercise she has no formal routine. she eats at home and does follow low sodium diet.   She is presently taking olmesartan , chlorthalidone , labetalol  in the morning.  She takes labetalol  again in the evening.  She is only taking nifedipine  as needed if her blood pressure is greater than 160 just overall and frequently.  Previous antihypertensives: Amlodipine - migraine Chlorthalidone -   Past Medical History:  Diagnosis Date   Anxiety    Asthma    occasionally, exercise-induced; uses inhaler; not used in a while   Cannabis hyperemesis syndrome concurrent with and due to cannabis abuse 08/02/2018   Gestational hypertension 01/25/2019   Hemorrhoids during puerperium with postpartum complication 01/31/2019   Hypertension    started as gestational   Marijuana use 08/24/2016   Positive  tox screen in Jan 2018   Scoliosis     Past Surgical History:  Procedure Laterality Date   NO PAST SURGERIES      Current Medications: Active Medications[1]   Allergies:   Patient has no known allergies.   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  Tobacco Use   Smoking status: Never    Passive exposure: Never   Smokeless tobacco: Never  Vaping Use   Vaping status: Never Used  Substance and Sexual Activity   Alcohol use: Yes    Comment: Occasional drinker i do not drink every day or every week   Drug use: Not Currently    Types: Marijuana    Comment: reported last time was when [redacted] weeks pregnant   Sexual activity: Not Currently    Birth control/protection: None  Other Topics Concern   Not on file  Social History Narrative   Not on file   Social Drivers of Health   Tobacco Use: Low Risk (03/04/2024)   Patient History    Smoking Tobacco Use: Never    Smokeless Tobacco Use: Never    Passive Exposure: Never  Financial Resource Strain: Medium Risk (03/04/2024)   Overall Financial Resource Strain (CARDIA)    Difficulty of Paying Living Expenses: Somewhat hard  Food Insecurity: Food Insecurity Present (03/04/2024)   Epic  Worried About Programme Researcher, Broadcasting/film/video in the Last Year: Sometimes true    [employer] of Food in the Last Year: Sometimes true  Transportation Needs: No Transportation Needs (03/04/2024)   Epic    Lack of Transportation (Medical): No    Lack of Transportation (Non-Medical): No  Physical Activity: Inactive (03/04/2024)   Exercise Vital Sign    Days of Exercise per Week: 0 days    Minutes of Exercise per Session: 0 min  Stress: Stress Concern Present (03/04/2024)   [employer] of Occupational Health - Occupational  Stress Questionnaire    Feeling of Stress: To some extent  Social Connections: Socially Isolated (03/04/2024)   Social Connection and Isolation Panel    Frequency of Communication with Friends and Family: Three times a week    Frequency of Social Gatherings with Friends and Family: Never    Attends Religious Services: Never    Database Administrator or Organizations: No    Attends Banker Meetings: Never    Marital Status: Never married  Depression (PHQ2-9): Medium Risk (09/20/2023)   Depression (PHQ2-9)    PHQ-2 Score: 7  Alcohol Screen: Low Risk (03/04/2024)   Alcohol Screen    Last Alcohol Screening Score (AUDIT): 1  Housing: Low Risk (03/04/2024)   Epic    Unable to Pay for Housing in the Last Year: No    Number of Times Moved in the Last Year: 0    Homeless in the Last Year: No  Utilities: At Risk (03/04/2024)   Epic    Threatened with loss of utilities: Yes  Health Literacy: Adequate Health Literacy (03/04/2024)   B1300 Health Literacy    Frequency of need for help with medical instructions: Never     Family History: The patient's family history includes Diabetes in her father; Early death in her father; Hypertension in her mother.  ROS:   Please see the history of present illness.     All other systems reviewed and are negative.  EKGs/Labs/Other Studies Reviewed:         Recent Labs: 09/20/2023: ALT 13; Hemoglobin 14.4; Platelets 236; TSH 0.876 10/12/2023: BUN 10; Creatinine, Ser 1.07; Potassium 3.9; Sodium 142   Recent Lipid Panel    Component Value Date/Time   CHOL 191 09/20/2023 1558   TRIG 89 09/20/2023 1558   HDL 36 (L) 09/20/2023 1558   CHOLHDL 5.3 (H) 09/20/2023 1558   LDLCALC 139 (H) 09/20/2023 1558    Physical Exam:   VS:  BP (!) 158/98 (BP Location: Left Arm, Patient Position: Sitting, Cuff Size: Large)   Pulse 70   Ht 5' 6 (1.676 m)   Wt 212 lb 11.2 oz (96.5 kg)   SpO2 97%   BMI 34.33 kg/m  , BMI Body mass index is 34.33  kg/m. GENERAL:  Well appearing HEENT: Pupils equal round and reactive, fundi not visualized, oral mucosa unremarkable NECK:  No jugular venous distention, waveform within normal limits, carotid upstroke brisk and symmetric, no bruits, no thyromegaly LYMPHATICS:  No cervical adenopathy LUNGS:  Clear to auscultation bilaterally HEART:  RRR.  PMI not displaced or sustained,S1 and S2 within normal limits, no S3, no S4, no clicks, no rubs, no murmurs ABD:  Flat, positive bowel sounds normal in frequency in pitch, no bruits, no rebound, no guarding, no midline pulsatile mass, no hepatomegaly, no splenomegaly EXT:  2 plus pulses throughout, no edema, no cyanosis no clubbing SKIN:  No rashes no nodules NEURO:  Cranial nerves II  through XII grossly intact, motor grossly intact throughout [facility]:  Cognitively intact, oriented to person place and time   ASSESSMENT/PLAN:    HTN -BP not at goal less than 130/80.  Home readings routinely 150s over 90s. Continue chlorthalidone  25 mg daily, labetalol  100 mg twice daily, olmesartan  40 mg daily. She is taking nifedipine  infrequently.  She will resume nifedipine  60 mg every evening. Discussed to monitor BP at home at least 2 hours after medications and sitting for 5-10 minutes.  MyChart message in 2 weeks to check in on BP. Recommend aiming for 150 minutes of moderate intensity activity per week and following a heart healthy diet.   Secondary hypertension workup Plan for sleep study, as below. Labs today renin aldosterone ratio, catecholamines, metanephrines. Prior sleep study, renal Doppler unremarkable.  HLD - not presently on lipid lowering therapy. Lp(a) today.  If elevated consider statin.  AHA Prevent Risk Calculator with 10-year risk of CVD 1.8% and 30-year risk of CVD 13.2%.  Snores  - [employer] 4. Plan for [facility] home sleep study.  Food insecurity - SW consult placed and provided bag of food from food pantry.  Screening for Secondary  Hypertension:     Relevant Labs/Studies:    Latest Ref Rng & Units 10/12/2023    4:08 PM 09/20/2023    3:58 PM 02/02/2023    4:17 PM  Basic Labs  Sodium 134 - 144 mmol/L 142  139  144   Potassium 3.5 - 5.2 mmol/L 3.9  4.4  3.5   Creatinine 0.57 - 1.00 mg/dL 8.92  8.95  8.82        Latest Ref Rng & Units 09/20/2023    3:58 PM 01/14/2019   10:36 AM  Thyroid   TSH 0.450 - 4.500 uIU/mL 0.876  1.100                 10/27/2023    8:30 AM  Renovascular   Renal Artery US  Completed Yes      Disposition:    FU with MD/APP/PharmD in 2 months    Medication Adjustments/Labs and Tests Ordered: Current medicines are reviewed at length with the patient today.  Concerns regarding medicines are outlined above.  Orders Placed This Encounter  Procedures   Aldosterone + renin activity w/ ratio   Metanephrines, plasma   Catecholamines, fractionated, plasma   Lipoprotein A (LPA)   Referral to HRT/VAS Care Navigation   [facility] Sleep Study   Meds ordered this encounter  Medications   NIFEdipine  (PROCARDIA  XL/NIFEDICAL XL) 60 MG 24 hr tablet    Sig: Take 1 tablet (60 mg total) by mouth daily.    Dispense:  90 tablet    Refill:  1    Please disregard previous Nifedipine  60mg  prescription. Please fill for generic of Procardia  XL 60mg  24 hour tablet.    Supervising Provider:   LONNI SLAIN [[id number]]   olmesartan  (BENICAR ) 40 MG tablet    Sig: Take 1 tablet (40 mg total) by mouth daily.    Dispense:  90 tablet    Refill:  3    Supervising Provider:   CHRISTOPHER, BRIDGETTE [[id number]]   labetalol  (NORMODYNE ) 100 MG tablet    Sig: Take 1 tablet (100 mg total) by mouth 2 (two) times daily.    Dispense:  180 tablet    Refill:  1    Supervising Provider:   LONNI SLAIN [[id number]]   chlorthalidone  (HYGROTON ) 25 MG tablet    Sig: Take 1 tablet (25 mg total)  by mouth daily.    Dispense:  90 tablet    Refill:  1    Supervising Provider:   LONNI SLAIN [[id number]]      Signed, Reche GORMAN Finder, NP  03/04/2024 10:35 AM    [facility]     [1]  Current Meds  Medication Sig   albuterol  (VENTOLIN  HFA) 108 (90 Base) MCG/ACT inhaler Inhale 1-2 puffs into the lungs every 4 (four) hours as needed for wheezing.   Blood Pressure KIT Z34.90 To monitored regularly at home Large   Cholecalciferol  1.25 MG (50000 UT) capsule Take 1 capsule (50,000 Units total) by mouth once a week.   ibuprofen  (ADVIL ) 600 MG tablet Take 600 mg by mouth every 6 (six) hours as needed.   [DISCONTINUED] chlorthalidone  (HYGROTON ) 25 MG tablet Take 25 mg by mouth daily.   [DISCONTINUED] dicyclomine  (BENTYL ) 20 MG tablet Take 1 tablet (20 mg total) by mouth 4 (four) times daily -  before meals and at bedtime.   [DISCONTINUED] NIFEdipine  (PROCARDIA  XL/NIFEDICAL XL) 60 MG 24 hr tablet Take 1 tablet (60 mg total) by mouth daily.   [DISCONTINUED] olmesartan  (BENICAR ) 40 MG tablet Take 1 tablet (40 mg total) by mouth daily.   [DISCONTINUED] pantoprazole  (PROTONIX ) 40 MG tablet Take 1 tablet (40 mg total) before breakfast once daily.   "

## 2024-03-05 ENCOUNTER — Telehealth (HOSPITAL_BASED_OUTPATIENT_CLINIC_OR_DEPARTMENT_OTHER): Payer: Self-pay | Admitting: Licensed Clinical Social Worker

## 2024-03-05 NOTE — Progress Notes (Unsigned)
 " Heart and Vascular Care Navigation  03/05/2024  Megan Schneider 1990/08/16 [id number]  Reason for Referral: food resources Patient is participating in a Managed Medicaid Plan:Yes  Engaged with patient by telephone for initial visit for Heart and Vascular Care Coordination.                                                                                                   Assessment:                                     Introduced self, role, reason for call. Confirmed home address, DOB and PCP. Pt resides with her two children, has employment with [employer] [place]. Shares they receive SNAP benefits but those were reduced to about $200. Appreciative of pantry assistance, mailed additional food resources to home address and encouraged her to use [employer] since there are children in the home. No issues with transportation, rent, or utilities (received assistance from [employer] w/ DSS). Encouraged her to call me as needed moving forward should additional needs arise.  HRT/VAS Care Coordination     Patients Home Cardiology Office [facility]   Outpatient Care Team Social Worker   Social Worker Name: marit Fenton HUGHS, [phone]   Living arrangements for the past 2 months Apartment   Lives with: Minor Children   Patient Current Insurance Coverage Medicaid   Patient Has Concern With Paying Medical Bills No   Does Patient Have Prescription Coverage? Yes   Home Assistive Devices/Equipment None       Social History:                                                                             SDOH Screenings   Food Insecurity: Food Insecurity Present (03/05/2024)  Housing: Low Risk (03/05/2024)  Transportation Needs: No Transportation Needs (03/05/2024)  Utilities: At Risk (03/05/2024)  Alcohol Screen: Low Risk (03/04/2024)  Depression (PHQ2-9): Medium Risk (09/20/2023)  Financial Resource Strain: Medium Risk (03/05/2024)  Physical Activity: Inactive (03/04/2024)  Social  Connections: Socially Isolated (03/04/2024)  Stress: Stress Concern Present (03/04/2024)  Tobacco Use: Low Risk (03/04/2024)  Health Literacy: Adequate Health Literacy (03/05/2024)    SDOH Interventions: Financial Resources:  Financial Strain Interventions: Other (Comment) (food assistance sent to pt) DSS for financial assistance  Food Insecurity:  Food Insecurity Interventions: [employer] Provided (assisted with pantry items; mailed resources for health net assistance and discussed [employer] and Chief Technology Officer)  Housing Insecurity:  Housing Interventions: Intervention Not Indicated  Transportation:   Transportation Interventions: Intervention Not Indicated   Other Care Navigation Interventions:     Provided Pharmacy assistance resources  Pt denied any issues obtaining or affording medications at  this time   Follow-up plan:   LCSW sent pt my card, food resources and encouraged her to let me know if any additional questions/concerns arise.      "

## 2024-03-12 ENCOUNTER — Ambulatory Visit (HOSPITAL_BASED_OUTPATIENT_CLINIC_OR_DEPARTMENT_OTHER): Payer: Self-pay | Admitting: Family

## 2024-03-12 ENCOUNTER — Other Ambulatory Visit (HOSPITAL_BASED_OUTPATIENT_CLINIC_OR_DEPARTMENT_OTHER): Payer: Self-pay

## 2024-03-12 DIAGNOSIS — I10 Essential (primary) hypertension: Secondary | ICD-10-CM

## 2024-03-12 LAB — METANEPHRINES, PLASMA
Metanephrine, Free: 43.8 pg/mL (ref 0.0–88.0)
Normetanephrine, Free: 51.1 pg/mL (ref 0.0–210.1)

## 2024-03-12 LAB — CATECHOLAMINES, FRACTIONATED, PLASMA
Dopamine: <10 pg/mL (ref 0.0–36.7)
Epinephrine: 79.9 pg/mL — ABNORMAL HIGH (ref 0.0–55.4)
Norepinephrine: 216 pg/mL (ref 115–524)

## 2024-03-12 LAB — LIPOPROTEIN A (LPA): Lipoprotein (a): 100.8 nmol/L — ABNORMAL HIGH

## 2024-03-12 LAB — ALDOSTERONE + RENIN ACTIVITY W/ RATIO
Aldos/Renin Ratio: >33.5 — ABNORMAL HIGH (ref 0.0–20.0)
Aldosterone: 5.6 ng/dL (ref 0.0–30.0)
Renin Activity, Plasma: <0.167 ng/mL/h — ABNORMAL LOW (ref 0.167–5.380)

## 2024-03-12 MED ORDER — ROSUVASTATIN CALCIUM 10 MG PO TABS: 10.0000 mg | ORAL_TABLET | Freq: Every day | ORAL | 3 refills | Status: AC

## 2024-03-12 MED ORDER — NIFEDIPINE ER OSMOTIC RELEASE 60 MG PO TB24
60.0000 mg | ORAL_TABLET | Freq: Every day | ORAL | 1 refills | Status: AC
  Filled 2024-03-12: qty 90, 90d supply, fill #0

## 2024-03-12 NOTE — Addendum Note (Signed)
 Addended by: GLADIS PORTER HERO on: 03/12/2024 12:24 PM   Modules accepted: Orders

## 2024-03-12 NOTE — Telephone Encounter (Signed)
-----   Message from Nurse Porter HERO sent at 03/12/2024 11:01 AM EST ----- I'm going to go ahead and place orders, she just had some long-term questions to ask you.  Thanks, Porter  ----- Message ----- From: Vannie Reche RAMAN, NP Sent: 03/12/2024  10:15 AM EST To: Porter HERO Lunger, LPN  Labs not consistent with hyperaldosteronism.  Abnormal catecholamines, normal metanephrines.  Recommend 24-hour urine catecholamines for further evaluation.    Elevated LP(a) indicating familial hyperlipidemia.  This is a genetic marker that indicates increased risk for future cardiovascular events.  If no plans for future pregnancy, recommend start  rosuvastatin  10 mg daily.  This is to lower cholesterol and lower cardiovascular risk.  If plans for future pregnancy, would await starting the prescription of medications until after pregnancy.

## 2024-03-12 NOTE — Telephone Encounter (Signed)
 The patient has been notified of the result and verbalized understanding.  All questions (if any) were answered.  Pt aware that Reche Finder, NP sent script for crestor  10 mg po daily to her pharmacy [facility].  Pt aware we have placed the order for her to get a 24 hour urine catecholamines done.  Pt aware she will need to come to our lab to pick the urine jug up and then go home and start the 24 hour urine collection process thereafter.  Pt aware that once she collects the 24 hour urine specimen, she will then need to drop that back off by our lab for processing. Pt is aware that I will mychart her the 24 hour urine collection instructions for reference to have when collecting the specimen.    Pt verbalized understanding and agrees with this plan.

## 2024-03-15 ENCOUNTER — Other Ambulatory Visit (HOSPITAL_BASED_OUTPATIENT_CLINIC_OR_DEPARTMENT_OTHER): Payer: Self-pay

## 2024-04-05 ENCOUNTER — Ambulatory Visit (HOSPITAL_BASED_OUTPATIENT_CLINIC_OR_DEPARTMENT_OTHER): Admitting: Pulmonary Disease

## 2024-05-02 ENCOUNTER — Encounter (HOSPITAL_BASED_OUTPATIENT_CLINIC_OR_DEPARTMENT_OTHER): Admitting: Family

## 2024-09-16 ENCOUNTER — Ambulatory Visit (HOSPITAL_BASED_OUTPATIENT_CLINIC_OR_DEPARTMENT_OTHER): Admitting: Obstetrics & Gynecology
